# Patient Record
Sex: Male | Born: 1970 | Race: White | Hispanic: No | Marital: Single | Smoking: Former smoker
Health system: Southern US, Community
[De-identification: ages and names within clinical notes are randomized; demographics above are authoritative.]

## PROBLEM LIST (undated history)

## (undated) DIAGNOSIS — R918 Other nonspecific abnormal finding of lung field: Secondary | ICD-10-CM

## (undated) DIAGNOSIS — J45909 Unspecified asthma, uncomplicated: Secondary | ICD-10-CM

## (undated) DIAGNOSIS — D721 Eosinophilia, unspecified: Secondary | ICD-10-CM

## (undated) HISTORY — DX: Eosinophilia, unspecified: D72.10

## (undated) HISTORY — DX: Eosinophilia: D72.1

## (undated) HISTORY — DX: Unspecified asthma, uncomplicated: J45.909

## (undated) HISTORY — DX: Other nonspecific abnormal finding of lung field: R91.8

---

## 2017-06-16 DIAGNOSIS — J209 Acute bronchitis, unspecified: Secondary | ICD-10-CM | POA: Diagnosis not present

## 2017-09-23 DIAGNOSIS — L500 Allergic urticaria: Secondary | ICD-10-CM | POA: Diagnosis not present

## 2017-10-28 ENCOUNTER — Encounter (HOSPITAL_COMMUNITY): Payer: Self-pay | Admitting: Family Medicine

## 2017-10-28 ENCOUNTER — Ambulatory Visit (HOSPITAL_COMMUNITY)
Admission: EM | Admit: 2017-10-28 | Discharge: 2017-10-28 | Disposition: A | Discharge status: Home / Self Care | Payer: 59 | Attending: Family Medicine | Admitting: Family Medicine

## 2017-10-28 DIAGNOSIS — J209 Acute bronchitis, unspecified: Secondary | ICD-10-CM | POA: Diagnosis not present

## 2017-10-28 DIAGNOSIS — J4521 Mild intermittent asthma with (acute) exacerbation: Secondary | ICD-10-CM

## 2017-10-28 MED ORDER — ALBUTEROL SULFATE HFA 108 (90 BASE) MCG/ACT IN AERS: 2.0000 | INHALATION_SPRAY | RESPIRATORY_TRACT | 1 refills | Status: DC | PRN

## 2017-10-28 MED ORDER — IPRATROPIUM-ALBUTEROL 0.5-2.5 (3) MG/3ML IN SOLN
3.0000 mL | Freq: Once | RESPIRATORY_TRACT | Status: AC
  Administered 2017-10-28: 3 mL via RESPIRATORY_TRACT

## 2017-10-28 MED ORDER — IPRATROPIUM-ALBUTEROL 0.5-2.5 (3) MG/3ML IN SOLN
RESPIRATORY_TRACT | Status: AC
  Filled 2017-10-28: qty 3

## 2017-10-28 MED ORDER — PREDNISONE 20 MG PO TABS: ORAL_TABLET | ORAL | 0 refills | Status: DC

## 2017-10-28 NOTE — ED Triage Notes (Addendum)
Pt here for cough, congestion x 1 week. sts using OTC meds without relief. Hx of bronchitis. Reports hard to take a deep breath and a lot of drainage.

## 2017-10-28 NOTE — Discharge Instructions (Signed)
Return if symptoms do not improve over the next 24 hours.

## 2017-10-28 NOTE — ED Provider Notes (Signed)
[facility]   [id number] 10/28/17 Arrival Time: 1143   SUBJECTIVE:  Jose Ross is a 47 y.o. male who presents to the urgent care with complaint of cough, congestion x 1 week. sts using OTC meds without relief. Hx of bronchitis. Reports hard to take a deep breath and a lot of drainage.  He feels like he is breathing through a straw.  Has had this before years ago.  Ex-smoker.  No h/o asthma per se.   History reviewed. No pertinent past medical history. History reviewed. No pertinent family history. Social History   Socioeconomic History  . Marital status: Divorced    Spouse name: Not on file  . Number of children: Not on file  . Years of education: Not on file  . Highest education level: Not on file  Occupational History  . Not on file  Social Needs  . Financial resource strain: Not on file  . Food insecurity:    Worry: Not on file    Inability: Not on file  . Transportation needs:    Medical: Not on file    Non-medical: Not on file  Tobacco Use  . Smoking status: Never Smoker  . Smokeless tobacco: Never Used  Substance and Sexual Activity  . Alcohol use: Not on file  . Drug use: Not on file  . Sexual activity: Not on file  Lifestyle  . Physical activity:    Days per week: Not on file    Minutes per session: Not on file  . Stress: Not on file  Relationships  . Social connections:    Talks on phone: Not on file    Gets together: Not on file    Attends religious service: Not on file    Active member of club or organization: Not on file    Attends meetings of clubs or organizations: Not on file    Relationship status: Not on file  . Intimate partner violence:    Fear of current or ex partner: Not on file    Emotionally abused: Not on file    Physically abused: Not on file    Forced sexual activity: Not on file  Other Topics Concern  . Not on file  Social History Narrative  . Not on file   No outpatient medications have been marked as taking  for the 10/28/17 encounter [facility] Encounter).   No Known Allergies    ROS: As per HPI, remainder of ROS negative.   OBJECTIVE:   Vitals:   10/28/17 1217  BP: 136/88  Pulse: 78  Resp: (!) 22  Temp: 98.5 F (36.9 C)  SpO2: 96%     General appearance: alert; no distress Eyes: PERRL; EOMI; conjunctiva normal HENT: normocephalic; atraumatic;  nasal mucosa normal; oral mucosa normal Neck: supple; no thyromegaly Lungs: diffuse loud ronchi and wheezes on inspiration and expiration bilaterally Heart: regular rate and rhythm Back: no CVA tenderness Extremities: no cyanosis or edema; symmetrical with no gross deformities Skin: warm and dry Neurologic: normal gait; grossly normal Psychological: alert and cooperative; normal mood and affect      Labs:  No results found for this or any previous visit.  Labs Reviewed - No data to display  No results found.     ASSESSMENT & PLAN:  1. 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 ipratropium-albuterol (DUONEB) 0.5-2.5 (3) MG/3ML nebulizer solution 3 mL  . predniSONE (DELTASONE) 20 MG tablet  Sig: Two daily with food    Dispense:  10 tablet    Refill:  0  . albuterol (PROVENTIL HFA;VENTOLIN HFA) 108 (90 Base) MCG/ACT inhaler    Sig: Inhale 2 puffs into the lungs every 4 (four) hours as needed for wheezing or shortness of breath (cough, shortness of breath or wheezing.).    Dispense:  1 Inhaler    Refill:  1    Reviewed expectations re: course of current medical issues. Questions answered. Outlined signs and symptoms indicating need for more acute intervention. Patient verbalized understanding. After Visit Summary given.    Procedures:  Nebulizer treatment with partial improvement      Elvina SidleLauenstein, Traeton Bordas, MD 10/28/17 1302

## 2017-11-04 ENCOUNTER — Encounter (HOSPITAL_COMMUNITY): Payer: Self-pay | Admitting: Emergency Medicine

## 2017-11-04 ENCOUNTER — Ambulatory Visit (INDEPENDENT_AMBULATORY_CARE_PROVIDER_SITE_OTHER): Payer: 59

## 2017-11-04 ENCOUNTER — Other Ambulatory Visit: Payer: Self-pay

## 2017-11-04 ENCOUNTER — Ambulatory Visit (HOSPITAL_COMMUNITY)
Admission: EM | Admit: 2017-11-04 | Discharge: 2017-11-04 | Disposition: A | Discharge status: Home / Self Care | Payer: 59 | Attending: Family Medicine | Admitting: Family Medicine

## 2017-11-04 DIAGNOSIS — J209 Acute bronchitis, unspecified: Secondary | ICD-10-CM | POA: Diagnosis not present

## 2017-11-04 DIAGNOSIS — R0602 Shortness of breath: Secondary | ICD-10-CM | POA: Diagnosis not present

## 2017-11-04 DIAGNOSIS — R05 Cough: Secondary | ICD-10-CM | POA: Diagnosis not present

## 2017-11-04 MED ORDER — HYDROCOD POLST-CPM POLST ER 10-8 MG/5ML PO SUER: 5.0000 mL | Freq: Two times a day (BID) | ORAL | 0 refills | Status: AC | PRN

## 2017-11-04 MED ORDER — AZITHROMYCIN 250 MG PO TABS: 250.0000 mg | ORAL_TABLET | Freq: Every day | ORAL | 0 refills | Status: AC

## 2017-11-04 NOTE — ED Triage Notes (Signed)
C/o non-productive cough with chest congestion, "not better from last"

## 2017-11-04 NOTE — ED Provider Notes (Signed)
[facility]    CSN: [id number] Arrival date & time: 11/04/17  1002     History   Chief Complaint Chief Complaint  Patient presents with  . Cough    HPI Jose Ross is a 47 y.o. male.   With no medical history, presents today for coughing, congestion, running nose, difficult taking deep breath, and wheezing for 2 weeks.  Patient was seem a week ago for the same symptoms and was given prednisone and albuterol inhaler without relief. Patient denies any alleviating or aggravating factors. Denies history of asthma. Denies fever.      History reviewed. No pertinent past medical history.  There are no active problems to display for this patient.   History reviewed. No pertinent surgical history.     Home Medications    Prior to Admission medications   Medication Sig Start Date End Date Taking? Authorizing Provider  albuterol (PROVENTIL HFA;VENTOLIN HFA) 108 (90 Base) MCG/ACT inhaler Inhale 2 puffs into the lungs every 4 (four) hours as needed for wheezing or shortness of breath (cough, shortness of breath or wheezing.). 10/28/17   Elvina Sidle, MD  azithromycin (ZITHROMAX) 250 MG tablet Take 1 tablet (250 mg total) by mouth daily for 5 days. Take first 2 tablets together, then 1 every day until finished. 11/04/17 11/09/17  Lucia Estelle, NP  chlorpheniramine-HYDROcodone (TUSSIONEX PENNKINETIC ER) 10-8 MG/5ML SUER Take 5 mLs by mouth every 12 (twelve) hours as needed for up to 7 days for cough. 11/04/17 11/11/17  Lucia Estelle, NP  predniSONE (DELTASONE) 20 MG tablet Two daily with food 10/28/17   Elvina Sidle, MD    Family History No family history on file.  Social History Social History   Tobacco Use  . Smoking status: Never Smoker  . Smokeless tobacco: Never Used  Substance Use Topics  . Alcohol use: Not on file  . Drug use: Not on file     Allergies   Patient has no known allergies.   Review of Systems Review of Systems  Constitutional:  Positive for fatigue. Negative for chills and fever.  HENT: Positive for congestion and rhinorrhea. Negative for ear pain, sinus pressure, sinus pain, sneezing and sore throat.   Respiratory: Positive for cough and shortness of breath. Negative for wheezing.   Cardiovascular: Negative for chest pain and palpitations.  Gastrointestinal: Negative for abdominal pain, diarrhea and nausea.  Neurological: Negative for dizziness and headaches.     Physical Exam Triage Vital Signs ED Triage Vitals  Enc Vitals Group     BP 11/04/17 1025 127/87     Pulse Rate 11/04/17 1025 83     Resp --      Temp 11/04/17 1025 98 F (36.7 C)     Temp Source 11/04/17 1025 Oral     SpO2 11/04/17 1025 95 %     Weight --      Height --      Head Circumference --      Peak Flow --      Pain Score 11/04/17 1024 0     Pain Loc --      Pain Edu? --      Excl. in GC? --    No data found.  Updated Vital Signs BP 127/87 (BP Location: Right Arm)   Pulse 83   Temp 98 F (36.7 C) (Oral)   SpO2 95%   Physical Exam  Constitutional: He is oriented to person, place, and time. He appears well-developed and well-nourished.  HENT:  Head: Normocephalic and atraumatic.  Right Ear: External ear normal.  Left Ear: External ear normal.  Nose: Nose normal.  Mouth/Throat: Oropharynx is clear and moist. No oropharyngeal exudate.  TM pearly gray without erythema. No sinus tenderness on palpation.   Eyes: Pupils are equal, round, and reactive to light. Conjunctivae are normal.  Neck: Normal range of motion. Neck supple.  Cardiovascular: Normal rate, regular rhythm and normal heart sounds.  No murmur heard. Pulmonary/Chest: Effort normal. He has rales.  Abdominal: Soft. Bowel sounds are normal. There is no tenderness.  Neurological: He is alert and oriented to person, place, and time. Coordination normal.  Skin: Skin is warm and dry.  Nursing note and vitals reviewed.    UC Treatments / Results  Labs (all labs  ordered are listed, but only abnormal results are displayed) Labs Reviewed - No data to display  EKG None Radiology Dg Chest 2 View  Result Date: 11/04/2017 CLINICAL DATA:  Cough, chest congestion, and shortness of breath for the past 2 weeks. Former smoker. History of previous episodes of bronchitis. EXAM: CHEST - 2 VIEW COMPARISON:  None in PACs FINDINGS: The lungs are adequately inflated. The interstitial markings are coarse. The heart and pulmonary vascularity are normal. The mediastinum is normal in width. The trachea is midline. The bony thorax exhibits no acute abnormality. IMPRESSION: Mild interstitial prominence may reflect acute bronchitic change or smoking related changes. There is no alveolar pneumonia nor CHF. Electronically Signed   By: David  SwazilandJordan M.D.   On: 11/04/2017 11:05    Procedures Procedures (including critical care time)  Medications Ordered in UC Medications - No data to display   Initial Impression / Assessment and Plan / UC Course  I have reviewed the triage vital signs and the nursing notes.  Pertinent labs & imaging results that were available during my care of the patient were reviewed by me and considered in my medical decision making (see chart for details).  Final Clinical Impressions(s) / UC Diagnoses   Final diagnoses:  Acute bronchitis, unspecified organism   This x-ray shows acute bronchitic changes but no pneumonia. RX for tussionex given for cough. RX for Z-pak also given if cough continues to not improve. Advised patient to follow up with PCP for no improvement.   ED Discharge Orders        Ordered    chlorpheniramine-HYDROcodone ([facility]EX [facility]) 10-8 MG/5ML SUER  Every 12 hours PRN     11/04/17 1109    azithromycin (ZITHROMAX) 250 MG tablet  Daily     11/04/17 1109     Controlled Substance Prescriptions [place] Controlled Substance Registry consulted? Not Applicable   Lucia EstelleZheng, Lilou Kneip, NP 11/04/17 [phone]

## 2017-11-14 ENCOUNTER — Encounter (HOSPITAL_COMMUNITY): Payer: Self-pay | Admitting: Emergency Medicine

## 2017-11-14 ENCOUNTER — Ambulatory Visit (HOSPITAL_COMMUNITY)
Admission: EM | Admit: 2017-11-14 | Discharge: 2017-11-14 | Disposition: A | Discharge status: Home / Self Care | Payer: 59 | Attending: Family Medicine | Admitting: Family Medicine

## 2017-11-14 DIAGNOSIS — R062 Wheezing: Secondary | ICD-10-CM

## 2017-11-14 DIAGNOSIS — J988 Other specified respiratory disorders: Secondary | ICD-10-CM | POA: Diagnosis not present

## 2017-11-14 MED ORDER — PREDNISONE 20 MG PO TABS: ORAL_TABLET | ORAL | 0 refills | Status: DC

## 2017-11-14 MED ORDER — METHYLPREDNISOLONE SODIUM SUCC 125 MG IJ SOLR
INTRAMUSCULAR | Status: AC
  Filled 2017-11-14: qty 2

## 2017-11-14 MED ORDER — IPRATROPIUM-ALBUTEROL 0.5-2.5 (3) MG/3ML IN SOLN
RESPIRATORY_TRACT | Status: AC
  Filled 2017-11-14: qty 3

## 2017-11-14 MED ORDER — IPRATROPIUM-ALBUTEROL 0.5-2.5 (3) MG/3ML IN SOLN
3.0000 mL | Freq: Once | RESPIRATORY_TRACT | Status: AC
  Administered 2017-11-14: 3 mL via RESPIRATORY_TRACT

## 2017-11-14 MED ORDER — ALBUTEROL SULFATE HFA 108 (90 BASE) MCG/ACT IN AERS: 2.0000 | INHALATION_SPRAY | RESPIRATORY_TRACT | 1 refills | Status: DC | PRN

## 2017-11-14 MED ORDER — METHYLPREDNISOLONE SODIUM SUCC 125 MG IJ SOLR
125.0000 mg | Freq: Once | INTRAMUSCULAR | Status: AC
  Administered 2017-11-14: 125 mg via INTRAMUSCULAR

## 2017-11-14 MED ORDER — BENZONATATE 200 MG PO CAPS: 200.0000 mg | ORAL_CAPSULE | Freq: Two times a day (BID) | ORAL | 1 refills | Status: DC | PRN

## 2017-11-14 NOTE — Discharge Instructions (Signed)
Plenty of fluids. Start prednisone tomorrow.  Take 1 twice a day for 1 week, then 1 once a day for 1 week Use inhaler as needed Take Mucinex DM twice a day for cough Take Tessalon twice a day for cough until resolved Return as needed  Consider establishing with a PCP in [place].  I recommend Dr. Otilio Miu

## 2017-11-14 NOTE — ED Provider Notes (Signed)
[facility]    CSN: [id number] Arrival date & time: 11/14/17  4098     History   Chief Complaint Chief Complaint  Patient presents with  . Cough    HPI Jose Ross is a 47 y.o. male.   HPI  Urgent care follow up Still wheezing Has used almost all the albuterol inhaler No improvement - significant to date with prednisone for 5 d or the z pak Non smoker No underlying asthma Now with persistent SOB/wheeze/cough with no fever or chills or upper resp symptoms such as runny nose or congestion  He had a chest x-ray performed on 11/04/2017 and this was reviewed with him.  Some increased bronchial markings but no pneumonia.  History reviewed. No pertinent past medical history.  There are no active problems to display for this patient.   History reviewed. No pertinent surgical history.     Home Medications    Prior to Admission medications   Medication Sig Start Date End Date Taking? Authorizing Provider  albuterol (PROVENTIL HFA;VENTOLIN HFA) 108 (90 Base) MCG/ACT inhaler Inhale 2 puffs into the lungs every 4 (four) hours as needed for wheezing or shortness of breath (cough, shortness of breath or wheezing.). 11/14/17   Eustace Moore, MD  benzonatate (TESSALON) 200 MG capsule Take 1 capsule (200 mg total) by mouth 2 (two) times daily as needed for cough. 11/14/17   Eustace Moore, MD  predniSONE (DELTASONE) 20 MG tablet Two daily with food for 7 d then one a day for 7 d 11/14/17   Eustace Moore, MD    Family History No family history on file.  Social History Social History   Tobacco Use  . Smoking status: Never Smoker  . Smokeless tobacco: Never Used  Substance Use Topics  . Alcohol use: Not on file  . Drug use: Not on file     Allergies   Patient has no known allergies.   Review of Systems Review of Systems  Constitutional: Negative for chills and fever.  HENT: Negative for congestion, ear pain, postnasal drip and sore throat.     Eyes: Negative for pain, redness and visual disturbance.  Respiratory: Positive for cough, shortness of breath and wheezing.   Cardiovascular: Negative for chest pain and palpitations.  Gastrointestinal: Negative for abdominal pain and vomiting.  Genitourinary: Negative for dysuria and hematuria.  Musculoskeletal: Negative for arthralgias and back pain.  Skin: Negative for color change and rash.  Neurological: Negative for seizures and syncope.  All other systems reviewed and are negative.   Physical Exam Triage Vital Signs ED Triage Vitals  Enc Vitals Group     BP 11/14/17 1020 (!) 136/92     Pulse Rate 11/14/17 1020 72     Resp 11/14/17 1020 16     Temp 11/14/17 1020 97.6 F (36.4 C)     Temp Source 11/14/17 1020 Oral     SpO2 11/14/17 1020 100 %     Weight --      Height --      Head Circumference --      Peak Flow --      Pain Score 11/14/17 1114 0     Pain Loc --      Pain Edu? --      Excl. in GC? --    No data found.  Updated Vital Signs BP (!) 136/92 (BP Location: Right Arm)   Pulse 72   Temp 97.6 F (36.4 C) (Oral)   Resp  16   SpO2 100%       Physical Exam  Constitutional: He appears well-developed and well-nourished. He appears distressed.  Tachypnea, abbreviated sentences  HENT:  Head: Normocephalic and atraumatic.  Right Ear: External ear normal.  Left Ear: External ear normal.  Mouth/Throat: Oropharynx is clear and moist.  Eyes: Pupils are equal, round, and reactive to light. Conjunctivae are normal.  Neck: Neck supple.  Cardiovascular: Normal rate and regular rhythm.  No murmur heard. Pulmonary/Chest: Effort normal. No respiratory distress. He has wheezes.  Wheezes throughout both lung fields prevent adequate inspiration.  Her nebulized treatment was able to hear more lung sounds, but still had significant bilateral wheeze.  No rales or rhonchi noted.  Abdominal: Soft. There is no tenderness.  Musculoskeletal: He exhibits no edema.   Lymphadenopathy:    He has no cervical adenopathy.  Neurological: He is alert.  Skin: Skin is warm and dry.  Psychiatric: He has a normal mood and affect.  Nursing note and vitals reviewed.  Jittery after nebulized treatment  UC Treatments / Results   Procedures (including critical care time)  Medications Ordered in UC Medications  ipratropium-albuterol (DUONEB) 0.5-2.5 (3) MG/3ML nebulizer solution 3 mL (3 mLs Nebulization Given 11/14/17 1046)  methylPREDNISolone sodium succinate (SOLU-MEDROL) 125 mg/2 mL injection 125 mg (125 mg Intramuscular Given 11/14/17 1111)    Initial Impression / Assessment and Plan / UC Course  I have reviewed the triage vital signs and the nursing notes.  Pertinent labs & imaging results that were available during my care of the patient were reviewed by me and considered in my medical decision making (see chart for details).     Discussed with the patient that he had residual bronchial inflammation from his viral bronchitis.  Additional advice seems to be helpful.  This will be treated like an asthma attack with prednisone.  He will be given Tessalon for the coughing.  He will continue with albuterol.  He needs follow-up with the PCP.  He is given the name of the PCP taking new patients in [place].  Return here if worse at any time set up better Final Clinical Impressions(s) / UC Diagnoses   Final diagnoses:  Wheezing-associated respiratory infection     Discharge Instructions     Plenty of fluids. Start prednisone tomorrow.  Take 1 twice a day for 1 week, then 1 once a day for 1 week Use inhaler as needed Take Mucinex DM twice a day for cough Take Tessalon twice a day for cough until resolved Return as needed  Consider establishing with a PCP in [place].  I recommend Dr. Otilio Miu   ED Prescriptions    Medication Sig Dispense Auth. Provider   predniSONE (DELTASONE) 20 MG tablet Two daily with food for 7 d then one a day for 7 d 21 tablet Eustace Moore, MD   albuterol (PROVENTIL HFA;VENTOLIN HFA) 108 (90 Base) MCG/ACT inhaler Inhale 2 puffs into the lungs every 4 (four) hours as needed for wheezing or shortness of breath (cough, shortness of breath or wheezing.). 1 Inhaler Eustace Moore, MD   benzonatate (TESSALON) 200 MG capsule Take 1 capsule (200 mg total) by mouth 2 (two) times daily as needed for cough. 20 capsule Eustace Moore, MD     Controlled Substance Prescriptions [place] Controlled Substance Registry consulted? Not Applicable   Eustace Moore, MD 11/14/17 1244

## 2017-11-14 NOTE — ED Triage Notes (Signed)
Pt c/o cough, has been here twice for the same thing, pt states hes been getting worse.

## 2017-12-01 DIAGNOSIS — J329 Chronic sinusitis, unspecified: Secondary | ICD-10-CM | POA: Diagnosis not present

## 2017-12-02 ENCOUNTER — Encounter: Payer: Self-pay | Admitting: Emergency Medicine

## 2017-12-02 ENCOUNTER — Ambulatory Visit: Payer: 59 | Admitting: Emergency Medicine

## 2017-12-02 VITALS — BP 128/90 | HR 81 | Ht 70.0 in | Wt 231.8 lb

## 2017-12-02 DIAGNOSIS — R05 Cough: Secondary | ICD-10-CM | POA: Diagnosis not present

## 2017-12-02 DIAGNOSIS — R0602 Shortness of breath: Secondary | ICD-10-CM | POA: Diagnosis not present

## 2017-12-02 DIAGNOSIS — R0600 Dyspnea, unspecified: Secondary | ICD-10-CM | POA: Insufficient documentation

## 2017-12-02 DIAGNOSIS — R059 Cough, unspecified: Secondary | ICD-10-CM | POA: Insufficient documentation

## 2017-12-02 NOTE — Assessment & Plan Note (Signed)
Please continue your loratadine  daily Start fluticasone nasal spray, 2 sprays each nostril daily (not right before bedtime).  You may want to try doing nasal saline rinses once a day to clear the sinus mucous Please start omeprazole  daily until next visit.   You can complete the levaquin prescription.

## 2017-12-02 NOTE — Assessment & Plan Note (Signed)
Acute onset and also in the setting of cough, rhinitis.  I suspect that he has had upper airway irritation, multifactorial but allergies appear to be the driver here.  Unclear whether he may have true asthma. Will try to treat for the most obvious contributors, assess PFT.    We will perform pulmonary function testing at your next visit Please continue your loratadine  daily Start fluticasone nasal spray, 2 sprays each nostril daily (not right before bedtime).  You may want to try doing nasal saline rinses once a day to clear the sinus mucous Please start omeprazole  daily until next visit.   You can complete the levaquin prescription.  Follow with Dr Delton Coombes next available with full PFT.

## 2017-12-02 NOTE — Patient Instructions (Addendum)
We will perform pulmonary function testing at your next visit Please continue your loratadine  daily Start fluticasone nasal spray, 2 sprays each nostril daily (not right before bedtime).  You may want to try doing nasal saline rinses once a day to clear the sinus mucous Please start omeprazole  daily until next visit.   You can complete the levaquin prescription.  Follow with Dr Delton Coombes next available with full PFT.

## 2017-12-02 NOTE — Progress Notes (Signed)
Subjective:    Patient ID: Jose Ross, male    DOB: [birth year], 47 y.o.   MRN: [id number]  HPI 47 year old former smoker (15 pk-yrs) with little PMH beyond contact dermatitis, here today for evaluation of dyspnea.  Was well until early April, began to have cough, noise with breathing / wheeze, then some evolving dyspnea w exertion. Cough was initially productive, now not. Has continued to have dyspnea, cough even at night awakening. Now has a lot of clear nasal nasal drainage. He does have some GERD sx several times a week.  He has been seen at Urgent Care and treated with azithro, pred, then again on 2 occasions. Has been using albuterol frequently. Then was treated with levaquin yesterday at another UC in [place].    Review of Systems  Constitutional: Negative for fever and unexpected weight change.  HENT: Positive for congestion, nosebleeds, sinus pressure and sneezing. Negative for dental problem, ear pain, postnasal drip, rhinorrhea, sore throat and trouble swallowing.   Eyes: Negative for redness and itching.  Respiratory: Positive for cough, chest tightness, shortness of breath and wheezing.   Cardiovascular: Negative for palpitations and leg swelling.  Gastrointestinal: Negative for nausea and vomiting.  Genitourinary: Negative for dysuria.  Musculoskeletal: Negative for joint swelling.  Skin: Negative for rash.  Allergic/Immunologic: Positive for environmental allergies. Negative for food allergies and immunocompromised state.  Neurological: Positive for headaches.  Hematological: Does not bruise/bleed easily.  Psychiatric/Behavioral: Negative for dysphoric mood. The patient is not nervous/anxious.     No past medical history on file.   No family history on file.   Social History   Socioeconomic History  . Marital status: Divorced    Spouse name: Not on file  . Number of children: Not on file  . Years of education: Not on file  . Highest education level: Not on  file  Occupational History  . Not on file  Social Needs  . Financial resource strain: Not on file  . Food insecurity:    Worry: Not on file    Inability: Not on file  . Transportation needs:    Medical: Not on file    Non-medical: Not on file  Tobacco Use  . Smoking status: Never Smoker  . Smokeless tobacco: Never Used  Substance and Sexual Activity  . Alcohol use: Not on file  . Drug use: Not on file  . Sexual activity: Not on file  Lifestyle  . Physical activity:    Days per week: Not on file    Minutes per session: Not on file  . Stress: Not on file  Relationships  . Social connections:    Talks on phone: Not on file    Gets together: Not on file    Attends religious service: Not on file    Active member of club or organization: Not on file    Attends meetings of clubs or organizations: Not on file    Relationship status: Not on file  . Intimate partner violence:    Fear of current or ex partner: Not on file    Emotionally abused: Not on file    Physically abused: Not on file    Forced sexual activity: Not on file  Other Topics Concern  . Not on file  Social History Narrative  . 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   No  Known Allergies   Outpatient Medications Prior to Visit  Medication Sig Dispense Refill  . albuterol (PROVENTIL HFA;VENTOLIN HFA) 108 (90 Base) MCG/ACT inhaler Inhale 2 puffs into the lungs every 4 (four) hours as needed for wheezing or shortness of breath (cough, shortness of breath or wheezing.). 1 Inhaler 1  . benzonatate (TESSALON) 200 MG capsule Take 1 capsule (200 mg total) by mouth 2 (two) times daily as needed for cough. 20 capsule 1  . predniSONE (DELTASONE) 20 MG tablet Two daily with food for 7 d then one a day for 7 d (Patient not taking: Reported on 12/02/2017) 21 tablet 0   No facility-administered medications prior  to visit.         Objective:   Physical Exam Vitals:   12/02/17 1003  BP: 128/90  Pulse: 81  SpO2: 93%  Weight: 231 lb 12.8 oz (105.1 kg)  Height:  (1.778 m)   Gen: Pleasant, well-nourished, in no distress,  normal affect  ENT: No lesions,  mouth clear,  oropharynx clear but crowded, no postnasal drip  Neck: No JVD, insp and exp UA noise, stridor  Lungs: No use of accessory muscles, B rhonchi and referred UA noise  Cardiovascular: RRR, heart sounds normal, no murmur or gallops, no peripheral edema  Musculoskeletal: No deformities, no cyanosis or clubbing  Neuro: alert, non focal  Skin: Warm, no lesions or rash     Assessment & Plan:  Dyspnea Acute onset and also in the setting of cough, rhinitis.  I suspect that he has had upper airway irritation, multifactorial but allergies appear to be the driver here.  Unclear whether he may have true asthma. Will try to treat for the most obvious contributors, assess PFT.    We will perform pulmonary function testing at your next visit Please continue your loratadine  daily Start fluticasone nasal spray, 2 sprays each nostril daily (not right before bedtime).  You may want to try doing nasal saline rinses once a day to clear the sinus mucous Please start omeprazole  daily until next visit.   You can complete the levaquin prescription.  Follow with Dr Delton Coombes next available with full PFT.   Cough Please continue your loratadine  daily Start fluticasone nasal spray, 2 sprays each nostril daily (not right before bedtime).  You may want to try doing nasal saline rinses once a day to clear the sinus mucous Please start omeprazole  daily until next visit.   You can complete the levaquin prescription.   Levy Pupa, MD, PhD 12/02/2017, 10:42 AM [facility] [phone] or if no answer [phone]

## 2017-12-04 ENCOUNTER — Ambulatory Visit (INDEPENDENT_AMBULATORY_CARE_PROVIDER_SITE_OTHER): Payer: 59 | Admitting: Adult Health

## 2017-12-04 ENCOUNTER — Encounter: Payer: Self-pay | Admitting: Adult Health

## 2017-12-04 ENCOUNTER — Ambulatory Visit (INDEPENDENT_AMBULATORY_CARE_PROVIDER_SITE_OTHER)
Admission: RE | Admit: 2017-12-04 | Discharge: 2017-12-04 | Disposition: A | Discharge status: Home / Self Care | Payer: 59 | Source: Ambulatory Visit | Attending: Adult Health | Admitting: Adult Health

## 2017-12-04 ENCOUNTER — Other Ambulatory Visit (INDEPENDENT_AMBULATORY_CARE_PROVIDER_SITE_OTHER): Payer: 59

## 2017-12-04 VITALS — BP 142/84 | HR 109 | Ht 70.0 in | Wt 232.6 lb

## 2017-12-04 DIAGNOSIS — J4551 Severe persistent asthma with (acute) exacerbation: Secondary | ICD-10-CM | POA: Diagnosis not present

## 2017-12-04 DIAGNOSIS — J45909 Unspecified asthma, uncomplicated: Secondary | ICD-10-CM | POA: Insufficient documentation

## 2017-12-04 DIAGNOSIS — R0602 Shortness of breath: Secondary | ICD-10-CM

## 2017-12-04 DIAGNOSIS — R05 Cough: Secondary | ICD-10-CM | POA: Diagnosis not present

## 2017-12-04 LAB — CBC WITH DIFFERENTIAL/PLATELET
BASOS PCT: 1.4 % (ref 0.0–3.0)
Basophils Absolute: 0.2 10*3/uL — ABNORMAL HIGH (ref 0.0–0.1)
Eosinophils Absolute: 5.8 10*3/uL — ABNORMAL HIGH (ref 0.0–0.7)
Eosinophils Relative: 36.5 % — ABNORMAL HIGH (ref 0.0–5.0)
HEMATOCRIT: 51.6 % (ref 39.0–52.0)
Hemoglobin: 17.6 g/dL — ABNORMAL HIGH (ref 13.0–17.0)
LYMPHS ABS: 3.7 10*3/uL (ref 0.7–4.0)
Lymphocytes Relative: 23.5 % (ref 12.0–46.0)
MCHC: 34.2 g/dL (ref 30.0–36.0)
MCV: 93 fl (ref 78.0–100.0)
MONOS PCT: 5.8 % (ref 3.0–12.0)
Monocytes Absolute: 0.9 10*3/uL (ref 0.1–1.0)
NEUTROS ABS: 5.2 10*3/uL (ref 1.4–7.7)
NEUTROS PCT: 32.8 % — AB (ref 43.0–77.0)
PLATELETS: 195 10*3/uL (ref 150.0–400.0)
RBC: 5.55 Mil/uL (ref 4.22–5.81)
RDW: 13.5 % (ref 11.5–15.5)
WBC: 15.8 10*3/uL — ABNORMAL HIGH (ref 4.0–10.5)

## 2017-12-04 LAB — HEPATIC FUNCTION PANEL
ALK PHOS: 114 U/L (ref 39–117)
ALT: 14 U/L (ref 0–53)
AST: 13 U/L (ref 0–37)
Albumin: 4.3 g/dL (ref 3.5–5.2)
BILIRUBIN TOTAL: 0.4 mg/dL (ref 0.2–1.2)
Bilirubin, Direct: 0.1 mg/dL (ref 0.0–0.3)
Total Protein: 7.6 g/dL (ref 6.0–8.3)

## 2017-12-04 LAB — BASIC METABOLIC PANEL
BUN: 17 mg/dL (ref 6–23)
CALCIUM: 9.7 mg/dL (ref 8.4–10.5)
CO2: 28 mEq/L (ref 19–32)
CREATININE: 1.29 mg/dL (ref 0.40–1.50)
Chloride: 103 mEq/L (ref 96–112)
GFR: 63.48 mL/min (ref >60.00)
Glucose, Bld: 98 mg/dL (ref 70–99)
Potassium: 4.6 mEq/L (ref 3.5–5.1)
Sodium: 139 mEq/L (ref 135–145)

## 2017-12-04 LAB — BRAIN NATRIURETIC PEPTIDE: Pro B Natriuretic peptide (BNP): 14 pg/mL (ref 0.0–100.0)

## 2017-12-04 LAB — NITRIC OXIDE: Nitric Oxide: 152

## 2017-12-04 LAB — SEDIMENTATION RATE: Sed Rate: 44 mm/hr — ABNORMAL HIGH (ref 0–15)

## 2017-12-04 LAB — D-DIMER, QUANTITATIVE (NOT AT ARMC): D DIMER QUANT: 0.23 ug{FEU}/mL (ref <0.50)

## 2017-12-04 MED ORDER — METHYLPREDNISOLONE ACETATE 80 MG/ML IJ SUSP
120.0000 mg | Freq: Once | INTRAMUSCULAR | Status: AC
  Administered 2017-12-04: 120 mg via INTRAMUSCULAR

## 2017-12-04 MED ORDER — ALBUTEROL SULFATE (2.5 MG/3ML) 0.083% IN NEBU: 2.5000 mg | INHALATION_SOLUTION | RESPIRATORY_TRACT | 5 refills | Status: DC | PRN

## 2017-12-04 MED ORDER — LEVALBUTEROL HCL 0.63 MG/3ML IN NEBU
0.6300 mg | INHALATION_SOLUTION | Freq: Once | RESPIRATORY_TRACT | Status: AC
  Administered 2017-12-04: 0.63 mg via RESPIRATORY_TRACT

## 2017-12-04 MED ORDER — PREDNISONE 10 MG PO TABS: ORAL_TABLET | ORAL | 0 refills | Status: DC

## 2017-12-04 MED ORDER — MOMETASONE FURO-FORMOTEROL FUM 200-5 MCG/ACT IN AERO
2.0000 | INHALATION_SPRAY | Freq: Two times a day (BID) | RESPIRATORY_TRACT | 5 refills | Status: DC
Start: 2017-12-04 — End: 2018-03-05

## 2017-12-04 MED ORDER — MONTELUKAST SODIUM 10 MG PO TABS: 10.0000 mg | ORAL_TABLET | Freq: Every day | ORAL | 11 refills | Status: DC

## 2017-12-04 MED ORDER — MOMETASONE FURO-FORMOTEROL FUM 200-5 MCG/ACT IN AERO
2.0000 | INHALATION_SPRAY | Freq: Two times a day (BID) | RESPIRATORY_TRACT | Status: AC
  Administered 2017-12-04: 2 via RESPIRATORY_TRACT

## 2017-12-04 NOTE — Patient Instructions (Addendum)
Best option is to be admitted to hospital , if you change your mind call me back or go to ER . If you get worse will need to call 911 .  Finish Levaquin .  Prednisone taper over next week .  Begin Dulera 200 2 puffs Twice daily , rinse after use .  Mucinex DM Twice daily  As needed  Cough/congestion .  Claritin  daily  Prilosec  daily.  Begin Singulair   At bedtime  .  Avoid exposure to acrylic paints.  Albuterol inhlaer 2 puffs every 4hr as needed.  Set up Albuterol Neb -may use every 4hr as needed.  Follow up in 1 week Dr. Delton Coombes  Or Parrett NP and As needed   Please contact office for sooner follow up if symptoms do not improve or worsen or seek emergency care

## 2017-12-04 NOTE — Addendum Note (Signed)
Addended by: Boone Master E on: 12/04/2017 05:36 PM   Modules accepted: Orders

## 2017-12-04 NOTE — Assessment & Plan Note (Signed)
Acute asthma exacerbation slow to resolve.  Patient had multiple emergency room and urgent care visits has been treated with several courses of antibiotics and steroids with no significant clinical improvement.  On exam today patient has significant wheezing with accessory muscle use.  He was given a Xopenex nebulizer treatment with improved aeration but continued wheezing and accessory muscle use.  I recommended patient be admitted to the hospital.  Patient is very resistant to this and declines despite long discussion regarding potential complications of refractory asthma .  Chest xray today w/. No acute process. CBC w/ very high eosinophil levels . Singulair added.  IGE /RAST ordered.  Pt was given 2nd xopenex neb with improved aeration. He is speaking in full sentences . Went over action asthma plan .  Emphasized importance of seeking emergency care if not imrpoving or worsenigs  He was given a Depo-Medrol 120 mg IM injection in the office today.  Appeared to tolerate well.  He was given a sample of Dulera  rest of labs are pending Control for triggers including chronic rhinitis and GERD.  Finish antibiotics for possible underlying infectious source. Prednisone taper over the next 10 days.  He will return for close follow-up in 1 week sooner if needed  Plan  Patient Instructions  Best option is to be admitted to hospital , if you change your mind call me back or go to ER . If you get worse will need to call 911 .  Finish Levaquin .  Prednisone taper over next week .  Begin Dulera 200 2 puffs Twice daily , rinse after use .  Mucinex DM Twice daily  As needed  Cough/congestion .  Claritin  daily  Prilosec  daily.  Begin Singulair   At bedtime  .  Avoid exposure to acrylic paints.  Albuterol inhlaer 2 puffs every 4hr as needed.  Set up Albuterol Neb -may use every 4hr as needed.  Follow up in 1 week Dr. Delton Coombes  Or Starlina Lapre NP and As needed   Please contact office for sooner follow  up if symptoms do not improve or worsen or seek emergency care

## 2017-12-04 NOTE — Progress Notes (Signed)
  ID: Jose Ross, male    DOB: Nov 22, 1970, 47 y.o.   MRN: [id number]  Chief Complaint  Patient presents with  . Acute Visit    Asthma    Referring provider: Leslye Peer, MD  HPI: 47 year old male former smoker seen for pulmonary consult for shortness of breath and wheezing on Dec 02, 2017  12/04/2017 Acute OV : Cough and Dyspnea  Patient presents for an acute office visit.  Patient was seen on Dec 02, 2017 for a pulmonary consult for persistent shortness of breath cough and wheezing over the last 2 months.  He has been seen in the emergency room 3 times in an urgent care once.  He has been treated with azithromycin and prednisone on 2 separate occasions.  He was recently started on Levaquin and has a few days left of this for suspected sinusitis and bronchitis.  Patient says he has had only minimum improvement in symptoms .  He came to the office today for pulmonary function testing but was unable to do it due to severe shortness of breath cough and wheezing.  Patient says he has very sharp pleuritic pain along the left lateral ribs and back.  When he tries to take a deep breath or coughs.  He denies any hemoptysis, fever, orthopnea, edema. Prior to the last 2 months patient says he was healthy with no significant medical problems.  It was no no medications.  He is currently using albuterol few times a day Exhaled nitric oxide testing today was very high at 152 ppb. Denies birds/chickens at home . No unusual hobbies Girlfriend uses acrylic paints at home . No childhood asthma .  Mother has asthma .     No Known Allergies   There is no immunization history on file for this patient.  History reviewed. No pertinent past medical history.  Tobacco History: Social History   Tobacco Use  Smoking Status Never Smoker  Smokeless Tobacco Never Used   Counseling given: Not Answered   Outpatient Encounter Medications as of 12/04/2017  Medication Sig  . albuterol  (PROVENTIL HFA;VENTOLIN HFA) 108 (90 Base) MCG/ACT inhaler Inhale 2 puffs into the lungs every 4 (four) hours as needed for wheezing or shortness of breath (cough, shortness of breath or wheezing.).  Marland Kitchen benzonatate (TESSALON) 200 MG capsule Take 1 capsule (200 mg total) by mouth 2 (two) times daily as needed for cough.  . montelukast (SINGULAIR) 10 MG tablet Take 1 tablet (10 mg total) by mouth at bedtime.  . predniSONE (DELTASONE) 10 MG tablet 4 tabs for 3 days, then 3 tabs for 3 days, 2 tabs for 3 days, then 1 tab for 3 days, then stop  . [DISCONTINUED] predniSONE (DELTASONE) 20 MG tablet Two daily with food for 7 d then one a day for 7 d (Patient not taking: Reported on 12/02/2017)   Facility-Administered Encounter Medications as of 12/04/2017  Medication  . levalbuterol (XOPENEX) nebulizer solution 0.63 mg  . [COMPLETED] methylPREDNISolone acetate (DEPO-MEDROL) injection 120 mg     Review of Systems  Constitutional:   No  weight loss, night sweats,  Fevers, chills, fatigue, or  lassitude.  HEENT:   No headaches,  Difficulty swallowing,  Tooth/dental problems, or  Sore throat,                No sneezing, itching, ear ache, nasal congestion, post nasal drip,   CV:  No chest pain,  Orthopnea, PND, swelling in lower extremities, anasarca, dizziness, palpitations, syncope.  GI  No heartburn, indigestion, abdominal pain, nausea, vomiting, diarrhea, change in bowel habits, loss of appetite, bloody stools.   Resp:.  No chest wall deformity  Skin: no rash or lesions.  GU: no dysuria, change in color of urine, no urgency or frequency.  No flank pain, no hematuria   MS:  No joint pain or swelling.  No decreased range of motion.  No back pain.    Physical Exam  BP (!) 142/84 (BP Location: Left Arm, Cuff Size: Normal)   Pulse (!) 109   Ht  (1.778 m)   Wt 232 lb 9.6 oz (105.5 kg)   SpO2 94%   BMI 33.37 kg/m   GEN: A/Ox3; pleasant , NAD, obese   HEENT:  [place]/AT,  EACs-clear,  TMs-wnl, NOSE-clear, THROAT-clear, no lesions, no postnasal drip or exudate noted.   NECK:  Supple w/ fair ROM; no JVD; normal carotid impulses w/o bruits; no thyromegaly or nodules palpated; no lymphadenopathy.    RESP inspiratory and expiratory wheezing , positive accessory muscle use, no dullness to percussion, speaks in full sentences  CARD:  RRR, no m/r/g, no peripheral edema, pulses intact, no cyanosis or clubbing.  GI:   Soft & nt; nml bowel sounds; no organomegaly or masses detected.   Musco: Warm bil, no deformities or joint swelling noted.   Neuro: alert, no focal deficits noted.    Skin: Warm, no lesions or rashes    Lab Results:  CBC    Component Value Date/Time   WBC 15.8 (H) 12/04/2017 1548   RBC 5.55 12/04/2017 1548   HGB 17.6 (H) 12/04/2017 1548   HCT 51.6 12/04/2017 1548   PLT 195.0 12/04/2017 1548   MCV 93.0 12/04/2017 1548   MCHC 34.2 12/04/2017 1548   RDW 13.5 12/04/2017 1548   LYMPHSABS 3.7 12/04/2017 1548   MONOABS 0.9 12/04/2017 1548   EOSABS 5.8 (H) 12/04/2017 1548   BASOSABS 0.2 (H) 12/04/2017 1548    BMET    Component Value Date/Time   NA 139 12/04/2017 1548   K 4.6 12/04/2017 1548   CL 103 12/04/2017 1548   CO2 28 12/04/2017 1548   GLUCOSE 98 12/04/2017 1548   BUN 17 12/04/2017 1548   CREATININE 1.29 12/04/2017 1548   CALCIUM 9.7 12/04/2017 1548    BNP No results found for: BNP  ProBNP    Component Value Date/Time   PROBNP 14.0 12/04/2017 1548    Imaging: Dg Chest 2 View  Result Date: 12/04/2017 CLINICAL DATA:  Increased shortness of breath, chest tightness, wheezing, cough, congestion, and LEFT flank pain for 2 months, former smoker EXAM: CHEST - 2 VIEW COMPARISON:  11/04/2017 FINDINGS: Normal heart size, mediastinal contours, and pulmonary vascularity. Peribronchial thickening slightly increased since previous exam. No acute infiltrate, pleural effusion or pneumothorax. Bones unremarkable. IMPRESSION: Bronchitic changes  without infiltrate. Electronically Signed   By: Ulyses Southward M.D.   On: 12/04/2017 16:24     Assessment & Plan:   Acute asthma exacerbation Acute asthma exacerbation slow to resolve.  Patient had multiple emergency room and urgent care visits has been treated with several courses of antibiotics and steroids with no significant clinical improvement.  On exam today patient has significant wheezing with accessory muscle use.  He was given a Xopenex nebulizer treatment with improved aeration but continued wheezing and accessory muscle use.  I recommended patient be admitted to the hospital.  Patient is very resistant to this and declines despite long discussion regarding potential complications of refractory  asthma .  Chest xray today w/. No acute process. CBC w/ very high eosinophil levels . Singulair added.  IGE /RAST ordered.  Pt was given 2nd xopenex neb with improved aeration. He is speaking in full sentences . Went over action asthma plan .  Emphasized importance of seeking emergency care if not imrpoving or worsenigs  He was given a Depo-Medrol 120 mg IM injection in the office today.  Appeared to tolerate well.  He was given a sample of Dulera  rest of labs are pending Control for triggers including chronic rhinitis and GERD.  Finish antibiotics for possible underlying infectious source. Prednisone taper over the next 10 days.  He will return for close follow-up in 1 week sooner if needed  Plan  Patient Instructions  Best option is to be admitted to hospital , if you change your mind call me back or go to ER . If you get worse will need to call 911 .  Finish Levaquin .  Prednisone taper over next week .  Begin Dulera 200 2 puffs Twice daily , rinse after use .  Mucinex DM Twice daily  As needed  Cough/congestion .  Claritin  daily  Prilosec  daily.  Begin Singulair   At bedtime  .  Avoid exposure to acrylic paints.  Albuterol inhlaer 2 puffs every 4hr as needed.  Set up  Albuterol Neb -may use every 4hr as needed.  Follow up in 1 week Dr. Delton Coombes  Or Parrett NP and As needed   Please contact office for sooner follow up if symptoms do not improve or worsen or seek emergency care             Rubye Oaks, NP 12/04/2017

## 2017-12-05 LAB — RESPIRATORY ALLERGY PROFILE REGION II ~~LOC~~
Allergen, A. alternata, m6: 0.64 kU/L — ABNORMAL HIGH
Allergen, Cedar tree, t12: 0.1 kU/L — ABNORMAL HIGH
Allergen, Comm Silver Birch, t9: <0.1 kU/L
Allergen, Cottonwood, t14: <0.1 kU/L
Allergen, D pternoyssinus,d7: <0.1 kU/L
Allergen, Mouse Urine Protein, e78: <0.1 kU/L
Allergen, Mulberry, t76: <0.1 kU/L
Allergen, P. notatum, m1: <0.1 kU/L
Aspergillus fumigatus, m3: <0.1 kU/L
CLASS: 0
CLASS: 0
CLASS: 0
CLASS: 0
CLASS: 0
CLASS: 0
CLASS: 0
CLASS: 0
CLASS: 0
CLASS: 0
Cat Dander: <0.1 kU/L
Class: 0
Class: 0
Class: 0
Class: 0
Class: 0
Class: 0
Class: 0
Class: 0
Class: 0
Class: 0
Class: 0
Class: 0
Class: 0
Class: 1
D. farinae: <0.1 kU/L
IgE (Immunoglobulin E), Serum: 850 kU/L — ABNORMAL HIGH (ref <=114)
Johnson Grass: <0.1 kU/L
Pecan/Hickory Tree IgE: <0.1 kU/L
Rough Pigweed  IgE: <0.1 kU/L
Timothy Grass: <0.1 kU/L

## 2017-12-05 LAB — INTERPRETATION:

## 2017-12-10 ENCOUNTER — Telehealth: Payer: Self-pay | Admitting: Emergency Medicine

## 2017-12-10 NOTE — Telephone Encounter (Signed)
Notes recorded by Julio Sicks, NP on 12/09/2017 at 9:11 AM EDT Allergy profile /level is elevated - IgE is very high 850 .  Supports allergic asthma -  On return will check Aspergillus panel ab.  Please contact office for sooner follow up if symptoms do not improve or worsen or seek emergency care   Spoke with pt and notified of results per [facility]. Pt verbalized understanding and denied any questions.

## 2017-12-10 NOTE — Progress Notes (Signed)
Pt notified of results

## 2017-12-12 ENCOUNTER — Other Ambulatory Visit (INDEPENDENT_AMBULATORY_CARE_PROVIDER_SITE_OTHER): Payer: 59

## 2017-12-12 ENCOUNTER — Encounter: Payer: Self-pay | Admitting: Adult Health

## 2017-12-12 ENCOUNTER — Ambulatory Visit: Payer: 59 | Admitting: Adult Health

## 2017-12-12 VITALS — BP 148/88 | HR 92 | Ht 70.0 in | Wt 238.2 lb

## 2017-12-12 DIAGNOSIS — R059 Cough, unspecified: Secondary | ICD-10-CM

## 2017-12-12 DIAGNOSIS — J4551 Severe persistent asthma with (acute) exacerbation: Secondary | ICD-10-CM

## 2017-12-12 DIAGNOSIS — R05 Cough: Secondary | ICD-10-CM

## 2017-12-12 LAB — CBC WITH DIFFERENTIAL/PLATELET
BASOS ABS: 0 10*3/uL (ref 0.0–0.1)
Basophils Relative: 0.5 % (ref 0.0–3.0)
EOS ABS: 0.3 10*3/uL (ref 0.0–0.7)
Eosinophils Relative: 3.4 % (ref 0.0–5.0)
HCT: 48.5 % (ref 39.0–52.0)
Hemoglobin: 16.5 g/dL (ref 13.0–17.0)
Lymphocytes Relative: 21.9 % (ref 12.0–46.0)
Lymphs Abs: 2.2 10*3/uL (ref 0.7–4.0)
MCHC: 34 g/dL (ref 30.0–36.0)
MCV: 92.3 fl (ref 78.0–100.0)
MONO ABS: 0.8 10*3/uL (ref 0.1–1.0)
Monocytes Relative: 8.4 % (ref 3.0–12.0)
NEUTROS PCT: 65.8 % (ref 43.0–77.0)
Neutro Abs: 6.6 10*3/uL (ref 1.4–7.7)
Platelets: 235 10*3/uL (ref 150.0–400.0)
RBC: 5.25 Mil/uL (ref 4.22–5.81)
RDW: 14 % (ref 11.5–15.5)
WBC: 10 10*3/uL (ref 4.0–10.5)

## 2017-12-12 LAB — URINALYSIS, ROUTINE W REFLEX MICROSCOPIC
BILIRUBIN URINE: NEGATIVE
HGB URINE DIPSTICK: NEGATIVE
KETONES UR: NEGATIVE
Leukocytes, UA: NEGATIVE
NITRITE: NEGATIVE
Specific Gravity, Urine: 1.025 (ref 1.000–1.030)
Total Protein, Urine: NEGATIVE
URINE GLUCOSE: NEGATIVE
UROBILINOGEN UA: 0.2 (ref 0.0–1.0)
pH: 6 (ref 5.0–8.0)

## 2017-12-12 LAB — SEDIMENTATION RATE: SED RATE: 20 mm/h — AB (ref 0–15)

## 2017-12-12 LAB — NITRIC OXIDE: NITRIC OXIDE: 17

## 2017-12-12 MED ORDER — PREDNISONE 20 MG PO TABS: 20.0000 mg | ORAL_TABLET | Freq: Every day | ORAL | 0 refills | Status: DC

## 2017-12-12 MED ORDER — LEVALBUTEROL HCL 0.63 MG/3ML IN NEBU
0.6300 mg | INHALATION_SOLUTION | Freq: Once | RESPIRATORY_TRACT | Status: AC
  Administered 2017-12-12: 0.63 mg via RESPIRATORY_TRACT

## 2017-12-12 NOTE — Patient Instructions (Addendum)
Continue on Prednisone 20mg  daily .  Continue on Dulera 200 2 puffs Twice daily , rinse after use .  Mucinex DM Twice daily  As needed  Cough/congestion .  Change Claritin to Zyrtec 10mg  At bedtime  .  Prilosec 20mg  daily.  Begin Flonase 2 puffs daily .  Continue on Singulair At bedtime  .  Albuterol inhlaer 2 puffs every 4hr as needed.  Continue on Albuterol Neb -may use every 4hr as needed.  Labs today .  Sputum cx .  Set up HRCT Chest .  Follow up in 2 week Dr. Delton CoombesByrum  And As needed   Please contact office for sooner follow up if symptoms do not improve or worsen or seek emergency care

## 2017-12-12 NOTE — Progress Notes (Signed)
@Patient  ID: Jose Ross, male    DOB: 06/10/1971, 47 y.o.   MRN: [id number]  Chief Complaint  Patient presents with  . Follow-up    Asthma    Referring provider: No ref. provider found  HPI: 47 year old male former smoker seen for pulmonary consult for shortness of breath and wheezing on Dec 02, 2017 found to have significant allergic asthma  TEST  12/04/17 Exhaled nitric oxide testing today was very high at 152 ppb.  12/12/2017 Follow up : Asthma  Patient presents for a one-week follow-up.  Patient was seen last visit for an acute asthma exacerbation.  Patient has had difficulty over the last several months with recurrent asthma exacerbations requiring several courses of antibiotics and steroids with no significant clinical improvement.  Last visit patient was found to have an evaded exhaled nitric oxide test at 152.  Lab work showed very high eosinophil count.  With eosinophils at 5800.  IgE very high at 850. She was started on a prednisone taper.  And Dulera, Singulair Since last visit patient is feeling pt is feeling slightly better but still has significant cough and wheezing . Gets winded with minimal activity .  Exhaled nitric oxide testing is much improved down to 17 from previous 152 ppb .   No joint pain, rash or GI symptoms.       No Known Allergies   There is no immunization history on file for this patient.  History reviewed. No pertinent past medical history.  Tobacco History: Social History   Tobacco Use  Smoking Status Never Smoker  Smokeless Tobacco Never Used   Counseling given: Not Answered   Outpatient Encounter Medications as of 12/12/2017  Medication Sig  . albuterol (PROVENTIL HFA;VENTOLIN HFA) 108 (90 Base) MCG/ACT inhaler Inhale 2 puffs into the lungs every 4 (four) hours as needed for wheezing or shortness of breath (cough, shortness of breath or wheezing.).  Marland Kitchen albuterol (PROVENTIL) (2.5 MG/3ML) 0.083% nebulizer solution Take 3 mLs (2.5  mg total) by nebulization every 4 (four) hours as needed for wheezing or shortness of breath (dx: J45.51).  . benzonatate (TESSALON) 200 MG capsule Take 1 capsule (200 mg total) by mouth 2 (two) times daily as needed for cough.  . mometasone-formoterol (DULERA) 200-5 MCG/ACT AERO Inhale 2 puffs into the lungs 2 (two) times daily.  . montelukast (SINGULAIR) 10 MG tablet Take 1 tablet (10 mg total) by mouth at bedtime.  . predniSONE (DELTASONE) 10 MG tablet 4 tabs for 3 days, then 3 tabs for 3 days, 2 tabs for 3 days, then 1 tab for 3 days, then stop  . [EXPIRED] levalbuterol (XOPENEX) nebulizer solution 0.63 mg    No facility-administered encounter medications on file as of 12/12/2017.      Review of Systems  Constitutional:   No  weight loss, night sweats,  Fevers, chills, fatigue, or  lassitude.  HEENT:   No headaches,  Difficulty swallowing,  Tooth/dental problems, or  Sore throat,                No sneezing, itching, ear ache,  +nasal congestion, post nasal drip,   CV:  No chest pain,  Orthopnea, PND, swelling in lower extremities, anasarca, dizziness, palpitations, syncope.   GI  No heartburn, indigestion, abdominal pain, nausea, vomiting, diarrhea, change in bowel habits, loss of appetite, bloody stools.   Resp:    No chest wall deformity  Skin: no rash or lesions.  GU: no dysuria, change in color of urine, no  urgency or frequency.  No flank pain, no hematuria   MS:  No joint pain or swelling.  No decreased range of motion.  No back pain.    Physical Exam  There were no vitals taken for this visit.  GEN: A/Ox3; pleasant , NAD, well nourished    HEENT:  [place]/AT,  EACs-clear, TMs-wnl, NOSE-clear, THROAT-clear, no lesions, no postnasal drip or exudate noted.   NECK:  Supple w/ fair ROM; no JVD; normal carotid impulses w/o bruits; no thyromegaly or nodules palpated; no lymphadenopathy.   No stridor RESP bilateral expiratory wheezes, speaks in full sentences clear . no  accessory muscle use, no dullness to percussion  CARD:  RRR, no m/r/g, no peripheral edema, pulses intact, no cyanosis or clubbing.  GI:   Soft & nt; nml bowel sounds; no organomegaly or masses detected.   Musco: Warm bil, no deformities or joint swelling noted.   Neuro: alert, no focal deficits noted.    Skin: Warm, no lesions or rashes    Lab Results:  CBC    Component Value Date/Time   WBC 15.8 (H) 12/04/2017 1548   RBC 5.55 12/04/2017 1548   HGB 17.6 (H) 12/04/2017 1548   HCT 51.6 12/04/2017 1548   PLT 195.0 12/04/2017 1548   MCV 93.0 12/04/2017 1548   MCHC 34.2 12/04/2017 1548   RDW 13.5 12/04/2017 1548   LYMPHSABS 3.7 12/04/2017 1548   MONOABS 0.9 12/04/2017 1548   EOSABS 5.8 (H) 12/04/2017 1548   BASOSABS 0.2 (H) 12/04/2017 1548    BMET    Component Value Date/Time   NA 139 12/04/2017 1548   K 4.6 12/04/2017 1548   CL 103 12/04/2017 1548   CO2 28 12/04/2017 1548   GLUCOSE 98 12/04/2017 1548   BUN 17 12/04/2017 1548   CREATININE 1.29 12/04/2017 1548   CALCIUM 9.7 12/04/2017 1548    BNP No results found for: BNP  ProBNP    Component Value Date/Time   PROBNP 14.0 12/04/2017 1548    Imaging: Dg Chest 2 View  Result Date: 12/04/2017 CLINICAL DATA:  Increased shortness of breath, chest tightness, wheezing, cough, congestion, and LEFT flank pain for 2 months, former smoker EXAM: CHEST - 2 VIEW COMPARISON:  11/04/2017 FINDINGS: Normal heart size, mediastinal contours, and pulmonary vascularity. Peribronchial thickening slightly increased since previous exam. No acute infiltrate, pleural effusion or pneumothorax. Bones unremarkable. IMPRESSION: Bronchitic changes without infiltrate. Electronically Signed   By: Ulyses Southward M.D.   On: 12/04/2017 16:24     Assessment & Plan:   Acute asthma exacerbation Newly diagnosed severe allergic asthma with very high eosinophil and IgE level. Despite aggressive therapy with ICS/laba, Singulair, prednisone, Flonase and  antihistamine patient continues to have active symptoms.  He does have slight clinical improvement.  Will continue on prednisone at 20 mg. Look for underlying etiology for elevated eosinophil and IgE levels. High-res CT chest, ANCA panel sed rate are pending Close follow up ,  May need FOB with BAL to look at eosinophils .  Check sputum culture, Aspergillus panel.  Plan  Patient Instructions  Continue on Prednisone 20mg  daily .  Continue on Dulera 200 2 puffs Twice daily , rinse after use .  Mucinex DM Twice daily  As needed  Cough/congestion .  Change Claritin to Zyrtec 10mg  At bedtime  .  Prilosec 20mg  daily.  Begin Flonase 2 puffs daily .  Continue on Singulair At bedtime  .  Albuterol inhlaer 2 puffs every 4hr as needed.  Continue  on Albuterol Neb -may use every 4hr as needed.  Labs today .  Sputum cx .  Set up HRCT Chest .  Follow up in 2 week Dr. Delton CoombesByrum  And As needed   Please contact office for sooner follow up if symptoms do not improve or worsen or seek emergency care           Rubye Oaksammy Avyn Aden, NP 12/12/2017

## 2017-12-12 NOTE — Assessment & Plan Note (Signed)
Newly diagnosed severe allergic asthma with very high eosinophil and IgE level. Despite aggressive therapy with ICS/laba, Singulair, prednisone, Flonase and antihistamine patient continues to have active symptoms.  He does have slight clinical improvement.  Will continue on prednisone at 20 mg. Look for underlying etiology for elevated eosinophil and IgE levels. High-res CT chest, ANCA panel sed rate are pending Close follow up ,  May need FOB with BAL to look at eosinophils .  Check sputum culture, Aspergillus panel.  Plan  Patient Instructions  Continue on Prednisone  daily .  Continue on Dulera 200 2 puffs Twice daily , rinse after use .  Mucinex DM Twice daily  As needed  Cough/congestion .  Change Claritin to Zyrtec  At bedtime  .  Prilosec  daily.  Begin Flonase 2 puffs daily .  Continue on Singulair At bedtime  .  Albuterol inhlaer 2 puffs every 4hr as needed.  Continue on Albuterol Neb -may use every 4hr as needed.  Labs today .  Sputum cx .  Set up HRCT Chest .  Follow up in 2 week Dr. Delton Coombes  And As needed   Please contact office for sooner follow up if symptoms do not improve or worsen or seek emergency care

## 2017-12-15 LAB — ANCA SCREEN W REFLEX TITER: ANCA SCREEN: NEGATIVE

## 2017-12-23 ENCOUNTER — Encounter: Payer: Self-pay | Admitting: Emergency Medicine

## 2017-12-23 ENCOUNTER — Ambulatory Visit: Payer: 59 | Admitting: Emergency Medicine

## 2017-12-23 VITALS — BP 112/70 | HR 86 | Ht 70.0 in | Wt 233.0 lb

## 2017-12-23 DIAGNOSIS — R059 Cough, unspecified: Secondary | ICD-10-CM

## 2017-12-23 DIAGNOSIS — J4550 Severe persistent asthma, uncomplicated: Secondary | ICD-10-CM

## 2017-12-23 DIAGNOSIS — D721 Eosinophilia, unspecified: Secondary | ICD-10-CM

## 2017-12-23 DIAGNOSIS — J309 Allergic rhinitis, unspecified: Secondary | ICD-10-CM | POA: Diagnosis not present

## 2017-12-23 DIAGNOSIS — R05 Cough: Secondary | ICD-10-CM

## 2017-12-23 NOTE — Progress Notes (Signed)
Subjective:    Patient ID: Jose Ross, male    DOB: 18-Jan-1971, 47 y.o.   MRN: [id number]  HPI 47 year old former smoker (15 pk-yrs) with little PMH beyond contact dermatitis, here today for evaluation of dyspnea.  Was well until early April, began to have cough, noise with breathing / wheeze, then some evolving dyspnea w exertion. Cough was initially productive, now not. Has continued to have dyspnea, cough even at night awakening. Now has a lot of clear nasal nasal drainage. He does have some GERD sx several times a week.  He has been seen at Urgent Care and treated with azithro, pred, then again on 2 occasions. Has been using albuterol frequently. Then was treated with levaquin yesterday at another UC in [place].   ROV 12/23/17 --this follow-up visit for patient with a minimal tobacco history with dyspnea and clinical history consistent with asthma.  He has been treated for what sounds like recurrent exacerbations and has been found to have extremely high exhaled nitric oxide level, eosinophil count, IgE.  He was last seen in our office 5/31 by T Parrett. He is now on Dulera and Singulair, completed a pred taper 2 days ago. He is using albuterol about 2x a day. RAST testing > pending. His congestion and cough are better. He still has some dyspnea, chest tightness. CXR 5/23 > bronchitis changes. A CT chest is pending.   His FENO today is 74   ANCA 5/31 >> negative  Review of Systems  Constitutional: Negative for fever and unexpected weight change.  HENT: Positive for congestion, nosebleeds, sinus pressure and sneezing. Negative for dental problem, ear pain, postnasal drip, rhinorrhea, sore throat and trouble swallowing.   Eyes: Negative for redness and itching.  Respiratory: Positive for cough, chest tightness, shortness of breath and wheezing.   Cardiovascular: Negative for palpitations and leg swelling.  Gastrointestinal: Negative for nausea and vomiting.  Genitourinary: Negative  for dysuria.  Musculoskeletal: Negative for joint swelling.  Skin: Negative for rash.  Allergic/Immunologic: Positive for environmental allergies. Negative for food allergies and immunocompromised state.  Neurological: Positive for headaches.  Hematological: Does not bruise/bleed easily.  Psychiatric/Behavioral: Negative for dysphoric mood. The patient is not nervous/anxious.     No past medical history on file.   No family history on file.   Social History   Socioeconomic History  . Marital status: Divorced    Spouse name: Not on file  . Number of children: Not on file  . Years of education: Not on file  . Highest education level: Not on file  Occupational History  . Not on file  Social Needs  . Financial resource strain: Not on file  . Food insecurity:    Worry: Not on file    Inability: Not on file  . Transportation needs:    Medical: Not on file    Non-medical: Not on file  Tobacco Use  . Smoking status: Never Smoker  . Smokeless tobacco: Never Used  Substance and Sexual Activity  . Alcohol use: Not on file  . Drug use: Not on file  . Sexual activity: Not on file  Lifestyle  . Physical activity:    Days per week: Not on file    Minutes per session: Not on file  . Stress: Not on file  Relationships  . Social connections:    Talks on phone: Not on file    Gets together: Not on file    Attends religious service: Not on file  Active member of club or organization: Not on file    Attends meetings of clubs or organizations: Not on file    Relationship status: Not on file  . Intimate partner violence:    Fear of current or ex partner: Not on file    Emotionally abused: Not on file    Physically abused: Not on file    Forced sexual activity: Not on file  Other Topics Concern  . Not on file  Social History Narrative  . 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  No military Has lived throughout the [place]  Lives in a townhouse, no known  mold exposure.   No Known Allergies   Outpatient Medications Prior to Visit  Medication Sig Dispense Refill  . albuterol (PROVENTIL HFA;VENTOLIN HFA) 108 (90 Base) MCG/ACT inhaler Inhale 2 puffs into the lungs every 4 (four) hours as needed for wheezing or shortness of breath (cough, shortness of breath or wheezing.). 1 Inhaler 1  . albuterol (PROVENTIL) (2.5 MG/3ML) 0.083% nebulizer solution Take 3 mLs (2.5 mg total) by nebulization every 4 (four) hours as needed for wheezing or shortness of breath (dx: J45.51). 75 mL 5  . benzonatate (TESSALON) 200 MG capsule Take 1 capsule (200 mg total) by mouth 2 (two) times daily as needed for cough. 20 capsule 1  . mometasone-formoterol (DULERA) 200-5 MCG/ACT AERO Inhale 2 puffs into the lungs 2 (two) times daily. 1 Inhaler 5  . montelukast (SINGULAIR) 10 MG tablet Take 1 tablet (10 mg total) by mouth at bedtime. 30 tablet 11  . predniSONE (DELTASONE) 10 MG tablet 4 tabs for 3 days, then 3 tabs for 3 days, 2 tabs for 3 days, then 1 tab for 3 days, then stop 30 tablet 0  . predniSONE (DELTASONE) 20 MG tablet Take 1 tablet (20 mg total) by mouth daily with breakfast. 30 tablet 0   No facility-administered medications prior to visit.         Objective:   Physical Exam Vitals:   12/23/17 0940 12/23/17 0941  BP:  112/70  Pulse:  86  SpO2:  98%  Weight: 233 lb (105.7 kg)   Height: 5\' 10"  (1.778 m)    Gen: Pleasant, well-nourished, in no distress,  normal affect  ENT: No lesions,  mouth clear,  oropharynx clear but crowded, no postnasal drip  Neck: No JVD, no stridor   Lungs: No use of accessory muscles, clear bilaterally  Cardiovascular: RRR, heart sounds normal, no murmur or gallops, no peripheral edema  Musculoskeletal: No deformities, no cyanosis or clubbing  Neuro: alert, non focal  Skin: Warm, no lesions or rash     Assessment & Plan:  Asthma With all the clinical manifestations although pulmonary function testing has not yet  been done.  He has a profound eosinophilia, elevated IgE.  He responded to the addition of Dulera, Singulair.  He also benefited from a prednisone taper which he just completed.  CT scan of his chest has been ordered given his bronchitic changes and the profound eosinophilia.  I do not suspect an eosinophilic pneumonia based on the chest x-ray,   But it needs to be evaluated.  We will reschedule his PFT.  He needs an allergy evaluation and skin testing I will refer him for this as well.  We will refer you to Allergy for official skin testing and possible allergy shots.  Continue zyrtec 10mg  daily Please continue Dulera 2 puffs twice a day.  Remember to rinse and gargle after using. Please continue Singulair 10 mg each evening. Keep your albuterol available to use if needed for shortness of breath, wheezing, chest tightness. Get your CT scan of the chest as planned We will need to reschedule your PFT Follow with Dr Delton CoombesByrum after the CT scan of your chest to review the results and plan our next steps.  Levy Pupaobert Chayden Garrelts, MD, PhD 12/23/2017, 10:25 AM [facility] [phone] or if no answer [phone]

## 2017-12-23 NOTE — Progress Notes (Signed)
Called spoke with patient, advised of lab results / recs as stated by TP.  Pt verbalized understanding and denied any questions. 

## 2017-12-23 NOTE — Assessment & Plan Note (Signed)
With all the clinical manifestations although pulmonary function testing has not yet been done.  He has a profound eosinophilia, elevated IgE.  He responded to the addition of Dulera, Singulair.  He also benefited from a prednisone taper which he just completed.  CT scan of his chest has been ordered given his bronchitic changes and the profound eosinophilia.  I do not suspect an eosinophilic pneumonia based on the chest x-ray,   But it needs to be evaluated.  We will reschedule his PFT.  He needs an allergy evaluation and skin testing I will refer him for this as well.  We will refer you to Allergy for official skin testing and possible allergy shots.  Continue zyrtec 10mg  daily Please continue Dulera 2 puffs twice a day.  Remember to rinse and gargle after using. Please continue Singulair 10 mg each evening. Keep your albuterol available to use if needed for shortness of breath, wheezing, chest tightness. Get your CT scan of the chest as planned We will need to reschedule your PFT Follow with Dr Delton CoombesByrum after the CT scan of your chest to review the results and plan our next steps.

## 2017-12-23 NOTE — Patient Instructions (Addendum)
We will refer you to Allergy for official skin testing and possible allergy shots.  Continue zyrtec 10mg  daily Please continue Dulera 2 puffs twice a day.  Remember to rinse and gargle after using. Please continue Singulair 10 mg each evening. Keep your albuterol available to use if needed for shortness of breath, wheezing, chest tightness. Get your CT scan of the chest as planned We will need to reschedule your PFT Follow with Dr Delton CoombesByrum after the CT scan of your chest to review the results and plan our next steps.

## 2017-12-26 ENCOUNTER — Other Ambulatory Visit: Payer: Self-pay | Admitting: Emergency Medicine

## 2017-12-26 DIAGNOSIS — J4550 Severe persistent asthma, uncomplicated: Secondary | ICD-10-CM

## 2017-12-29 ENCOUNTER — Ambulatory Visit (INDEPENDENT_AMBULATORY_CARE_PROVIDER_SITE_OTHER): Payer: 59 | Admitting: Emergency Medicine

## 2017-12-29 DIAGNOSIS — J4550 Severe persistent asthma, uncomplicated: Secondary | ICD-10-CM | POA: Diagnosis not present

## 2017-12-29 LAB — PULMONARY FUNCTION TEST
DL/VA % PRED: 106 %
DL/VA: 4.99 ml/min/mmHg/L
DLCO COR: 29.91 ml/min/mmHg
DLCO UNC % PRED: 95 %
DLCO cor % pred: 90 %
DLCO unc: 31.4 ml/min/mmHg
FEF 25-75 Post: 3.86 L/sec
FEF 25-75 Pre: 2.62 L/sec
FEF2575-%CHANGE-POST: 47 %
FEF2575-%PRED-PRE: 71 %
FEF2575-%Pred-Post: 105 %
FEV1-%Change-Post: 13 %
FEV1-%PRED-PRE: 81 %
FEV1-%Pred-Post: 92 %
FEV1-Post: 3.78 L
FEV1-Pre: 3.34 L
FEV1FVC-%CHANGE-POST: 3 %
FEV1FVC-%Pred-Pre: 94 %
FEV6-%Change-Post: 9 %
FEV6-%PRED-PRE: 89 %
FEV6-%Pred-Post: 97 %
FEV6-Post: 4.93 L
FEV6-Pre: 4.52 L
FEV6FVC-%Change-Post: 0 %
FEV6FVC-%PRED-PRE: 103 %
FEV6FVC-%Pred-Post: 103 %
FVC-%Change-Post: 9 %
FVC-%PRED-PRE: 86 %
FVC-%Pred-Post: 94 %
FVC-POST: 4.93 L
FVC-PRE: 4.52 L
POST FEV1/FVC RATIO: 77 %
POST FEV6/FVC RATIO: 100 %
Pre FEV1/FVC ratio: 74 %
Pre FEV6/FVC Ratio: 100 %
RV % PRED: 92 %
RV: 1.85 L
TLC % pred: 90 %
TLC: 6.39 L

## 2017-12-29 NOTE — Progress Notes (Signed)
PFT completed today. 12/29/17  

## 2017-12-30 LAB — ASPERGILLUS IGE PANEL
A. Amstel/Glaucu Class Interp: 0
A. Flavus Class Interp: 0
A. Fumigatus Class Interp: 0
A. NIGER CLASS INTERP: 0
A. Nidulans Class Interp: 0
A. Terreus Class Interp: 0
A. VERSICOLOR CLASS INTERP: 0
Aspergillus flavus IgE: <0.35 kU/L (ref <0.35)
Aspergillus fumigatus IgE: <0.1 kU/L (ref <0.35)
Aspergillus nidulans IgE: <0.35 kU/L (ref <0.35)
Aspergillus terreus IgE: <0.35 kU/L (ref <0.35)

## 2017-12-30 NOTE — Progress Notes (Signed)
LMOMTCB x 1 

## 2017-12-31 ENCOUNTER — Telehealth: Payer: Self-pay | Admitting: Emergency Medicine

## 2017-12-31 NOTE — Telephone Encounter (Signed)
Advised pt of results. Pt understood and nothing further is needed.     Notes recorded by Melvenia Needles, NP on 12/23/2017 at 9:30 AM EDT Aspergillus panel pending  Rest of labs are improved ESR decreased, eosinophils Dropped dramatically that is great  ANCA screen is neg.  Discuss in detail with Dr. Lamonte Sakai Ov  Please contact office for sooner follow up if symptoms do not improve or worsen or seek emergency care   ANCA screen with reflex titer  Order: [id number]  Status:  Final result Visible to patient:  No (Not Released) Next appt:  01/01/2018 at 04:30 PM in Radiology (LBCT-CT 1) Dx:  Cough   Ref Range & Units 2wk ago  ANCA Screen NEGATIVE NEGATIVE   Comment: ANCA Screen includes evaluation for p-ANCA, c-ANCA and  atypical p-ANCA. A positive ANCA screen reflexes to  titer and pattern(s), e.g., cytoplasmic pattern  (c-ANCA), perinuclear pattern (p-ANCA), or atypical  p-ANCA pattern. c-ANCA and p-ANCA are observed in  vasculitis, whereas atypical p-ANCA is observed in  IBD (Inflammatory Bowel Disease). Atypical p-ANCA is  detected in about 55% to 80% of patients with  ulcerative colitis but only 5% to 25% of patients with  Crohn's disease.  Marland Kitchen   Resulting Agency  [facility]      Specimen Collected: 12/12/17 17:25 Last Resulted: 12/15/17 14:27     Lab Flowsheet    Order Details    View Encounter    Lab and Collection Details    Routing    Result History          Other Results from 12/12/2017   Result Notes for Urinalysis, Routine w reflex microscopic   Notes recorded by Rinaldo Ratel, CMA on 12/30/2017 at 3:23 PM EDT LMOM TCB x1. ------  Notes recorded by Melvenia Needles, NP on 12/30/2017 at 10:24 AM EDT Allergy labs did not show aspergillus .  Cont w. Ov recs and follow up with Allergist.  Please contact office for sooner follow up if symptoms do not improve or worsen or seek emergency care    ------  Notes recorded by Rinaldo Ratel, CMA on 12/23/2017  at 5:08 PM EDT Called spoke with patient, advised of lab results / recs as stated by TP. Pt verbalized understanding and denied any questions.  ------  Notes recorded by Melvenia Needles, NP on 12/23/2017 at 9:30 AM EDT Aspergillus panel pending  Rest of labs are improved ESR decreased, eosinophils Dropped dramatically that is great  ANCA screen is neg.  Discuss in detail with Dr. Lamonte Sakai Ov  Please contact office for sooner follow up if symptoms do not improve or worsen or seek emergency care   Urinalysis, Routine w reflex microscopic  Order: [id number]   Status:  Final result Visible to patient:  No (Not Released) Next appt:  01/01/2018 at 04:30 PM in Radiology (LBCT-CT 1) Dx:  Cough   Ref Range & Units 2wk ago  Color, Urine Yellow;Lt. Yellow YELLOW   APPearance Clear CLEAR   Specific Gravity, Urine 1.000 - 1.030 1.025   pH 5.0 - 8.0 6.0   Total Protein, Urine Negative NEGATIVE   Urine Glucose Negative NEGATIVE   Ketones, ur Negative NEGATIVE   Bilirubin Urine Negative NEGATIVE   Hgb urine dipstick Negative NEGATIVE   Urobilinogen, UA 0.0 - 1.0 0.2   Leukocytes, UA Negative NEGATIVE   Nitrite Negative NEGATIVE   WBC, UA 0-2/hpf 0-2/hpf   RBC / HPF 0-2/hpf 0-2/hpf   Squamous Epithelial /  LPF Rare(0-4/hpf) Rare(0-4/hpf)   Resulting Agency  [facility]      Specimen Collected: 12/12/17 17:25 Last Resulted: 12/12/17 17:42     Lab Flowsheet    Order Details    View Encounter    Lab and Collection Details    Routing    Result History            Result Notes for Sedimentation rate   Notes recorded by Rinaldo Ratel, CMA on 12/30/2017 at 3:23 PM EDT LMOM TCB x1. ------  Notes recorded by Melvenia Needles, NP on 12/30/2017 at 10:24 AM EDT Allergy labs did not show aspergillus .  Cont w. Ov recs and follow up with Allergist.  Please contact office for sooner follow up if symptoms do not improve or worsen or seek emergency care    ------  Notes recorded  by Rinaldo Ratel, CMA on 12/23/2017 at 5:08 PM EDT Called spoke with patient, advised of lab results / recs as stated by TP. Pt verbalized understanding and denied any questions.  ------  Notes recorded by Melvenia Needles, NP on 12/23/2017 at 9:30 AM EDT Aspergillus panel pending  Rest of labs are improved ESR decreased, eosinophils Dropped dramatically that is great  ANCA screen is neg.  Discuss in detail with Dr. Lamonte Sakai Ov  Please contact office for sooner follow up if symptoms do not improve or worsen or seek emergency care   Contains abnormal data Sedimentation rate  Order: [id number]   Status:  Final result Visible to patient:  No (Not Released) Next appt:  01/01/2018 at 04:30 PM in Radiology (LBCT-CT 1) Dx:  Cough   Ref Range & Units 2wk ago 3wk ago  Sed Rate 0 - 15 mm/hr 20High   44High    Resulting Agency  [facility]      Specimen Collected: 12/12/17 17:25 Last Resulted: 12/12/17 17:37     Lab Flowsheet    Order Details    View Encounter    Lab and Collection Details    Routing    Result History            Result Notes for CBC w/Diff   Notes recorded by Rinaldo Ratel, CMA on 12/30/2017 at 3:23 PM EDT LMOM TCB x1. ------  Notes recorded by Melvenia Needles, NP on 12/30/2017 at 10:24 AM EDT Allergy labs did not show aspergillus .  Cont w. Ov recs and follow up with Allergist.  Please contact office for sooner follow up if symptoms do not improve or worsen or seek emergency care    ------  Notes recorded by Rinaldo Ratel, CMA on 12/23/2017 at 5:08 PM EDT Called spoke with patient, advised of lab results / recs as stated by TP. Pt verbalized understanding and denied any questions.  ------  Notes recorded by Melvenia Needles, NP on 12/23/2017 at 9:30 AM EDT Aspergillus panel pending  Rest of labs are improved ESR decreased, eosinophils Dropped dramatically that is great  ANCA screen is neg.  Discuss in detail with Dr.  Lamonte Sakai Ov  Please contact office for sooner follow up if symptoms do not improve or worsen or seek emergency care   CBC w/Diff  Order: [id number]   Status:  Final result Visible to patient:  No (Not Released) Next appt:  01/01/2018 at 04:30 PM in Radiology (LBCT-CT 1) Dx:  Cough   Ref Range & Units 2wk ago 3wk ago  WBC 4.0 - 10.5 K/uL 10.0  15.8High    RBC 4.22 - 5.81 Mil/uL 5.25  5.55   Hemoglobin 13.0 - 17.0 g/dL 16.5  17.6High    HCT 39.0 - 52.0 % 48.5  51.6   MCV 78.0 - 100.0 fl 92.3  93.0   MCHC 30.0 - 36.0 g/dL 34.0  34.2   RDW 11.5 - 15.5 % 14.0  13.5   Platelets 150.0 - 400.0 K/uL 235.0  195.0   Neutrophils Relative % 43.0 - 77.0 % 65.8  32.8Low    Lymphocytes Relative 12.0 - 46.0 % 21.9  23.5   Monocytes Relative 3.0 - 12.0 % 8.4  5.8   Eosinophils Relative 0.0 - 5.0 % 3.4  36.5High  CM  Basophils Relative 0.0 - 3.0 % 0.5  1.4   Neutro Abs 1.4 - 7.7 K/uL 6.6  5.2   Lymphs Abs 0.7 - 4.0 K/uL 2.2  3.7   Monocytes Absolute 0.1 - 1.0 K/uL 0.8  0.9   Eosinophils Absolute 0.0 - 0.7 K/uL 0.3  5.8High    Basophils Absolute 0.0 - 0.1 K/uL 0.0  0.2High    Resulting Agency  [facility]      Specimen Collected: 12/12/17 17:25 Last Resulted: 12/12/17 17:37     Lab Flowsheet    Order Details    View Encounter    Lab and Collection Details    Routing    Result History      CM=Additional comments        Result Notes for Aspergillus IgE Panel   Notes recorded by Rinaldo Ratel, CMA on 12/30/2017 at 3:23 PM EDT LMOM TCB x1. ------  Notes recorded by Melvenia Needles, NP on 12/30/2017 at 10:24 AM EDT Allergy labs did not show aspergillus .  Cont w. Ov recs and follow up with Allergist.  Please contact office for sooner follow up if symptoms do not improve or worsen or seek emergency care    ------  Notes recorded by Rinaldo Ratel, CMA on 12/23/2017 at 5:08 PM EDT Called spoke with patient, advised of lab results / recs as stated by TP. Pt  verbalized understanding and denied any questions.  ------  Notes recorded by Melvenia Needles, NP on 12/23/2017 at 9:30 AM EDT Aspergillus panel pending  Rest of labs are improved ESR decreased, eosinophils Dropped dramatically that is great  ANCA screen is neg.  Discuss in detail with Dr. Lamonte Sakai Ov  Please contact office for sooner follow up if symptoms do not improve or worsen or seek emergency care   Aspergillus IgE Panel  Order: [id number]   Status:  Final result Visible to patient:  No (Not Released) Next appt:  01/01/2018 at 04:30 PM in Radiology ([facility]CT-CT 1) Dx:  Cough   Ref Range & Units 2wk ago  Aspergillus fumigatus IgE <0.35 kU/L <0.10   A. Fumigatus Class Interp  0   Aspergillus [place] IgE <0.35 kU/L <0.10   A. [place] Class Interp  0   Aspergillus versicolor IgE <0.35 kU/L <0.10   A. Versicolor Class Interp  0   Comment: The test method is the [employer] ImmunoCAP allergen-specific  IgE system. CLASS INTERPRETATION  <0.10 kU/L= 0,  Negative; 0.10 - 0.34 kU/L= 0/1, Equivocal/Borderline;  0.35 - 0.69 kU/L=1, Low Positive; 0.70 - 3.49 kU/L=2,  Moderate Positive; 3.50 - 17.49 kU/L=3, High Positive;  17.50 - 49.99 kU/L= 4, Very High Positive; 50.00 - 99.99  kU/L= 5, Very High Positive;  >99.99 kU/L=6, Very High  Positive  Aspergillus amstel/glaucu IgE* <0.35 kU/L <0.35   A. Amstel/Glaucu Class Interp  0   Aspergillus flavus IgE <0.35 kU/L <0.35   A. Flavus Class Interp  0   Aspergillus nidulans IgE <0.35 kU/L <0.35   A. Nidulans Class Interp  0   Aspergillus terreus IgE <0.35 kU/L <0.35   A. Terreus Class Interp  0   Comment: This conventional EIA uses allergen-coated discs from  several suppliers and an enzyme-labeled anti-IgE. CLASS  INTERPRETATION: <0.35 kU/L=0, Below Detection; 0.35-0.69  kU/L= 1, Low Positive; 0.70-3.49 kU/L= 2, Moderate  Positive; 3.50-17.49 kU/L= 3, Positive; 17.50-49.99 kU/L=  4, Strong Positive; 50.00-99.99 kU/L= 5, Very Strong  Positive;  >99.99 kU/L= 6, Very Strong Positive  *This test was developed and its performance  characteristics determined by [employer]. It has not  been cleared or approved by the U.S. Food and Drug  Administration.   Resulting Agency  [facility]    Narrative  Performed by: [facility]  Performed at: [facility] [address] [place], [place] [id number] Lab Director: Bernadene Bell PhD, Phone: [phone]    Specimen Collected: 12/12/17 17:25 Last Resulted: 12/30/17 05:37     Lab Flowsheet    Order Details    View Encounter    Lab and Collection Details    Routing    Result History          Result Notes for ANCA screen with reflex titer   Notes recorded by Rinaldo Ratel, CMA on 12/30/2017 at 3:23 PM EDT LMOM TCB x1. ------  Notes recorded by Melvenia Needles, NP on 12/30/2017 at 10:24 AM EDT Allergy labs did not show aspergillus .  Cont w. Ov recs and follow up with Allergist.  Please contact office for sooner follow up if symptoms do not improve or worsen or seek emergency care    ------  Notes recorded by Rinaldo Ratel, CMA on 12/23/2017 at 5:08 PM EDT Called spoke with patient, advised of lab results / recs as stated by TP. Pt verbalized understanding and denied any questions.  ------  Notes recorded by Melvenia Needles, NP on 12/23/2017 at 9:30 AM EDT Aspergillus panel pending  Rest of labs are improved ESR decreased, eosinophils Dropped dramatically that is great  ANCA screen is neg.  Discuss in detail with Dr. Lamonte Sakai Ov  Please contact office for sooner follow up if symptoms do not improve or worsen or seek emergency care       All Reviewers List   Parrett, Fonnie Mu, NP on 12/30/2017 10:24  Rinaldo Ratel, CMA on 12/23/2017 17:08  Parrett, Fonnie Mu, NP on 12/23/2017 09:30  Encounter   View Encounter       Result Information   Status: Final result (Collected: 12/12/2017 17:25) Provider Status: Reviewed    Lab Information    [facility]    Order-Level Documents:   There are no order-level documents.  View SmartLink Info   ANCA screen with reflex titer (Order #[id number]) on 12/12/17  Result Read / Acknowledged   Acknowledge result  User Time Read / Charlynn Court, Fonnie Mu, NP 12/23/2017 9:30 AM  Rinaldo Ratel, CMA 12/23/2017 5:08 PM

## 2018-01-01 ENCOUNTER — Ambulatory Visit (INDEPENDENT_AMBULATORY_CARE_PROVIDER_SITE_OTHER)
Admission: RE | Admit: 2018-01-01 | Discharge: 2018-01-01 | Disposition: A | Discharge status: Home / Self Care | Payer: 59 | Source: Ambulatory Visit | Attending: Adult Health | Admitting: Adult Health

## 2018-01-01 DIAGNOSIS — R05 Cough: Secondary | ICD-10-CM

## 2018-01-01 DIAGNOSIS — R059 Cough, unspecified: Secondary | ICD-10-CM

## 2018-01-01 DIAGNOSIS — R918 Other nonspecific abnormal finding of lung field: Secondary | ICD-10-CM | POA: Diagnosis not present

## 2018-01-06 ENCOUNTER — Ambulatory Visit: Payer: 59 | Admitting: Emergency Medicine

## 2018-01-06 ENCOUNTER — Encounter: Payer: Self-pay | Admitting: Emergency Medicine

## 2018-01-06 ENCOUNTER — Other Ambulatory Visit (INDEPENDENT_AMBULATORY_CARE_PROVIDER_SITE_OTHER): Payer: 59

## 2018-01-06 DIAGNOSIS — J4551 Severe persistent asthma with (acute) exacerbation: Secondary | ICD-10-CM | POA: Diagnosis not present

## 2018-01-06 DIAGNOSIS — J301 Allergic rhinitis due to pollen: Secondary | ICD-10-CM

## 2018-01-06 DIAGNOSIS — J309 Allergic rhinitis, unspecified: Secondary | ICD-10-CM | POA: Insufficient documentation

## 2018-01-06 LAB — CBC WITH DIFFERENTIAL/PLATELET
BASOS ABS: 0 10*3/uL (ref 0.0–0.1)
BASOS PCT: 0.5 % (ref 0.0–3.0)
EOS ABS: 1.4 10*3/uL — AB (ref 0.0–0.7)
Eosinophils Relative: 13.2 % — ABNORMAL HIGH (ref 0.0–5.0)
HCT: 46.1 % (ref 39.0–52.0)
Hemoglobin: 16.1 g/dL (ref 13.0–17.0)
LYMPHS ABS: 1.6 10*3/uL (ref 0.7–4.0)
Lymphocytes Relative: 14.9 % (ref 12.0–46.0)
MCHC: 35 g/dL (ref 30.0–36.0)
MCV: 91.4 fl (ref 78.0–100.0)
Monocytes Absolute: 1.1 10*3/uL — ABNORMAL HIGH (ref 0.1–1.0)
Monocytes Relative: 10.4 % (ref 3.0–12.0)
NEUTROS ABS: 6.4 10*3/uL (ref 1.4–7.7)
NEUTROS PCT: 61 % (ref 43.0–77.0)
PLATELETS: 187 10*3/uL (ref 150.0–400.0)
RBC: 5.04 Mil/uL (ref 4.22–5.81)
RDW: 13.8 % (ref 11.5–15.5)
WBC: 10.6 10*3/uL — ABNORMAL HIGH (ref 4.0–10.5)

## 2018-01-06 MED ORDER — ALBUTEROL SULFATE HFA 108 (90 BASE) MCG/ACT IN AERS: 2.0000 | INHALATION_SPRAY | RESPIRATORY_TRACT | 5 refills | Status: DC | PRN

## 2018-01-06 MED ORDER — ALBUTEROL SULFATE (2.5 MG/3ML) 0.083% IN NEBU: 2.5000 mg | INHALATION_SOLUTION | RESPIRATORY_TRACT | 5 refills | Status: DC | PRN

## 2018-01-06 MED ORDER — BENZONATATE 200 MG PO CAPS: 200.0000 mg | ORAL_CAPSULE | Freq: Three times a day (TID) | ORAL | 1 refills | Status: DC | PRN

## 2018-01-06 MED ORDER — PREDNISONE 10 MG PO TABS: 50.0000 mg | ORAL_TABLET | Freq: Every day | ORAL | 0 refills | Status: DC

## 2018-01-06 NOTE — Patient Instructions (Addendum)
Follow with Allergist in mid July as planned.  Take prednisone 50 mg daily for 7 days and then stop.  Continue your Dulera 2 puffs twice a day.  Rinse and gargle after using. Continue Singulair once a day Continue Zyrtec and Flonase as you are taking them. We will start the process to possibly obtain Nucala in case you have persistent asthma symptoms even after we have better addressed your allergic disease. Blood work (IgE level and eosinophil count) today Follow with Dr. Delton CoombesByrum in July after your allergy testing, approximately 1 month.

## 2018-01-06 NOTE — Assessment & Plan Note (Signed)
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  His symptoms are certainly out of proportion.  He has a profoundly elevated IgE, eosinophil count.  I will recheck these today.  No evidence for ABPA on any of his other studies including CT chest, Aspergillus IgE, etc.  If he needs better allergy control and we are working on this.  I also want to be prepared for possible immunotherapy.  His IgE is elevated to the point where Xolair is not an option.  I will work on [employer]ucala  Continue your Dulera 2 puffs twice a day.  Rinse and gargle after using. Continue Singulair once a day We will start the process to possibly obtain Nucala in case you have persistent asthma symptoms even after we have better addressed your allergic disease. Blood work (IgE level and eosinophil count) today Follow with Dr. Delton CoombesByrum in July after your allergy testing, approximately 1 month.

## 2018-01-06 NOTE — Assessment & Plan Note (Signed)
Follow with Allergist in mid July as planned.  Take prednisone 50 mg daily for 7 days and then stop.  Continue Zyrtec and Flonase as you are taking them. Follow with Dr. Delton CoombesByrum in July after your allergy testing, approximately 1 month.

## 2018-01-06 NOTE — Progress Notes (Signed)
Subjective:    Patient ID: Jose Ross, male    DOB: 01-May-1971, 47 y.o.   MRN: [id number]  Shortness of Breath  Associated symptoms include headaches and wheezing. Pertinent negatives include no ear pain, fever, leg swelling, rash, rhinorrhea, sore throat or vomiting.   47 year old former smoker (15 pk-yrs) with little PMH beyond contact dermatitis, here today for evaluation of dyspnea.  Was well until early April, began to have cough, noise with breathing / wheeze, then some evolving dyspnea w exertion. Cough was initially productive, now not. Has continued to have dyspnea, cough even at night awakening. Now has a lot of clear nasal nasal drainage. He does have some GERD sx several times a week.  He has been seen at Urgent Care and treated with azithro, pred, then again on 2 occasions. Has been using albuterol frequently. Then was treated with levaquin yesterday at another UC in [place].   ROV 12/23/17 --this follow-up visit for patient with a minimal tobacco history with dyspnea and clinical history consistent with asthma.  He has been treated for what sounds like recurrent exacerbations and has been found to have extremely high exhaled nitric oxide level, eosinophil count, IgE.  He was last seen in our office 5/31 by T Parrett. He is now on Dulera and Singulair, completed a pred taper 2 days ago. He is using albuterol about 2x a day. RAST testing > pending. His congestion and cough are better. He still has some dyspnea, chest tightness. CXR 5/23 > bronchitis changes. A CT chest is pending. His FENO today is 74 . 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  He has frequent exacerbations.  He has bony function testing on 12/29/2017 and I have reviewed.  This shows mild obstruction and a positive bronchodilator response, normal lung volumes, normal diffusion capacity.  We have managed him on  Dulera, Singulair, Zyrtec, currently on flonase. He tried nasal saline - couldn't tolerate it.   We referred him for allergy evaluation, will be seen next month. He is having much more nasal gtt, associated dyspnea.  A CT scan of his chest done on 01/02/2018 was reviewed by me and shows no evidence for interstitial disease, some nonspecific scattered pulmonary nodules without a pattern consistent with opportunistic infection or atypical infection.  Some evidence for mild air trapping and small airways disease.    Review of Systems  Constitutional: Negative for fever and unexpected weight change.  HENT: Positive for congestion, nosebleeds, sinus pressure and sneezing. Negative for dental problem, ear pain, postnasal drip, rhinorrhea, sore throat and trouble swallowing.   Eyes: Negative for redness and itching.  Respiratory: Positive for cough, chest tightness, shortness of breath and wheezing.   Cardiovascular: Negative for palpitations and leg swelling.  Gastrointestinal: Negative for nausea and vomiting.  Genitourinary: Negative for dysuria.  Musculoskeletal: Negative for joint swelling.  Skin: Negative for rash.  Allergic/Immunologic: Positive for environmental allergies. Negative for food allergies and immunocompromised state.  Neurological: Positive for headaches.  Hematological: Does not bruise/bleed easily.  Psychiatric/Behavioral: Negative for dysphoric mood. The patient is not nervous/anxious.     No past medical history on file.   No family history on file.   Social History   Socioeconomic History  . Marital status: Divorced    Spouse name: Not on file  . Number of children: Not on file  . Years of education: Not on file  .  Highest education level: Not on file  Occupational History  . Not on file  Social Needs  . Financial resource strain: Not on file  . Food insecurity:    Worry: Not on file    Inability: Not on file  . Transportation needs:    Medical: Not on file     Non-medical: Not on file  Tobacco Use  . Smoking status: Never Smoker  . Smokeless tobacco: Never Used  Substance and Sexual Activity  . Alcohol use: Not on file  . Drug use: Not on file  . Sexual activity: Not on file  Lifestyle  . Physical activity:    Days per week: Not on file    Minutes per session: Not on file  . Stress: Not on file  Relationships  . Social connections:    Talks on phone: Not on file    Gets together: Not on file    Attends religious service: Not on file    Active member of club or organization: Not on file    Attends meetings of clubs or organizations: Not on file    Relationship status: Not on file  . Intimate partner violence:    Fear of current or ex partner: Not on file    Emotionally abused: Not on file    Physically abused: Not on file    Forced sexual activity: Not on file  Other Topics Concern  . Not on file  Social History Narrative  . 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   No Known Allergies   Outpatient Medications Prior to Visit  Medication Sig Dispense Refill  . mometasone-formoterol (DULERA) 200-5 MCG/ACT AERO Inhale 2 puffs into the lungs 2 (two) times daily. 1 Inhaler 5  . montelukast (SINGULAIR) 10 MG tablet Take 1 tablet (10 mg total) by mouth at bedtime. 30 tablet 11  . albuterol (PROVENTIL HFA;VENTOLIN HFA) 108 (90 Base) MCG/ACT inhaler Inhale 2 puffs into the lungs every 4 (four) hours as needed for wheezing or shortness of breath (cough, shortness of breath or wheezing.). 1 Inhaler 1  . albuterol (PROVENTIL) (2.5 MG/3ML) 0.083% nebulizer solution Take 3 mLs (2.5 mg total) by nebulization every 4 (four) hours as needed for wheezing or shortness of breath (dx: J45.51). 75 mL 5  . benzonatate (TESSALON) 200 MG capsule Take 1 capsule (200 mg total) by mouth 2 (two) times daily as needed for cough. 20 capsule 1    No facility-administered medications prior to visit.         Objective:   Physical Exam Vitals:   01/06/18 1135  BP: 126/84  Pulse: (!) 110  SpO2: 98%  Weight: 235 lb (106.6 kg)  Height: 5\' 10"  (1.778 m)   Gen: Pleasant, well-nourished, in no distress,  normal affect  ENT: No lesions,  mouth clear,  oropharynx clear but crowded significant PND and nasal obstruction  Neck: No JVD, no stridor   Lungs: No use of accessory muscles, few scattered end exp wheezes.   Cardiovascular: RRR, heart sounds normal, no murmur or gallops, no peripheral edema  Musculoskeletal: No deformities, no cyanosis or clubbing  Neuro: alert, non focal  Skin: Warm, no lesions or rash     Assessment & Plan:  Allergic rhinitis Follow with Allergist in mid July as planned.  Take prednisone 50 mg daily for 7 days and then stop.  Continue Zyrtec and Flonase as you are taking them. Follow with Dr. Delton CoombesByrum in July after your allergy testing, approximately 1 month.  Asthma Mild obstruction confirmed on his pulmonary function testing.  His symptoms are certainly out of proportion.  He has a profoundly elevated IgE, eosinophil count.  I will recheck these today.  No evidence for ABPA on any of his other studies including CT chest, Aspergillus IgE, etc.  If he needs better allergy control and we are working on this.  I also want to be prepared for possible immunotherapy.  His IgE is elevated to the point where Xolair is not an option.  I will work on [employer]  Continue your Dulera 2 puffs twice a day.  Rinse and gargle after using. Continue Singulair once a day We will start the process to possibly obtain Nucala in case you have persistent asthma symptoms even after we have better addressed your allergic disease. Blood work (IgE level and eosinophil count) today Follow with Dr. Delton CoombesByrum in July after your allergy testing, approximately 1 month.  Levy Pupaobert Hadeel Hillebrand, MD, PhD 01/06/2018, 12:07 PM [facility] [phone] or if no answer [phone]

## 2018-01-07 LAB — IGE: IGE (IMMUNOGLOBULIN E), SERUM: 588 kU/L — AB (ref <=114)

## 2018-01-08 ENCOUNTER — Ambulatory Visit: Payer: 59 | Admitting: Emergency Medicine

## 2018-01-12 ENCOUNTER — Telehealth: Payer: Self-pay | Admitting: Emergency Medicine

## 2018-01-12 NOTE — Telephone Encounter (Signed)
Jose Ross  please advise.

## 2018-01-13 NOTE — Telephone Encounter (Signed)
I returned [employer]'s call. The rep that called earlier missed the checked boxes. The rep I spoke to went by the note put in by Tamia. When she opened the file she saw the boxes of how we are wanting to acquire nucala were already checked.

## 2018-01-13 NOTE — Telephone Encounter (Signed)
Paper was signed and faxed. Waiting for summary of benefits.

## 2018-01-13 NOTE — Telephone Encounter (Signed)
Jose Ross, with [facility] calling regarding problem with form recd.  Return call to [phone].

## 2018-01-16 NOTE — Telephone Encounter (Signed)
I called [employer] back his summary of benefits is in process. [employer] is contacting his ins.. Rep said to call back Mon..Marland Kitchen

## 2018-01-19 ENCOUNTER — Telehealth: Payer: Self-pay | Admitting: Emergency Medicine

## 2018-01-19 NOTE — Telephone Encounter (Signed)
lmtcb for [employer]

## 2018-01-19 NOTE — Telephone Encounter (Signed)
Still waiting to hear something from [employer], Pharm or pt..Marland Kitchen

## 2018-01-20 NOTE — Telephone Encounter (Signed)
Attempted to call [employer]. I did not receive an answer. I have left a message for Percell Beltriana to return our call.

## 2018-01-21 NOTE — Telephone Encounter (Signed)
Will send clinical notes and tx regimen to [facility] for P/A as requested. Can't send LMN b/c RB is not here.

## 2018-01-21 NOTE — Telephone Encounter (Signed)
Attempted to call Ariana. I did not receive an answer. I have left a message for Ariana to return our call.  

## 2018-01-22 NOTE — Telephone Encounter (Signed)
We have attempted to contact Jose Ross several times with no success or call back. Per triage protocol, message will be closed.

## 2018-01-23 ENCOUNTER — Telehealth: Payer: Self-pay | Admitting: Emergency Medicine

## 2018-01-23 NOTE — Telephone Encounter (Signed)
[facility] She is requesting all of the pt's OV and work notes from our office. These have been faxed to Amy at [phone]. I also advised Amy that we did receive the form from them on the pt's work restrictions but RB is not back in the office until 02/03/18. She verbalized understanding. Nothing further was needed at this time.

## 2018-01-27 NOTE — Telephone Encounter (Signed)
I received a fax from [employer] that Pt's ins will be sending me pertinent info to begin pt's predetermination. I have not received anything from them yet , I'll call his ins. Co. Tomorrow.

## 2018-01-29 NOTE — Telephone Encounter (Signed)
I called pt's ins co., the Rep gave me a ref.#: [id number]: [id number], Dates: 01/28/18 thru 04/22/18, it will take 3 to 5 bus. days to process.The rep also gave me a fax # to fax clinical notes, labs med list and a copy of the ins. Card. I faxed the info and got the transmission log that it went thru this morning. Waiting to hear if pt is approved or not.

## 2018-02-02 NOTE — Telephone Encounter (Addendum)
I haven't heard from his ins company yet. 02/04/18 will be 5 bus. Days. If I haven't heard from them by then, I'll call them back.

## 2018-02-03 ENCOUNTER — Telehealth: Payer: Self-pay | Admitting: Emergency Medicine

## 2018-02-03 MED ORDER — EPINEPHRINE 0.3 MG/0.3ML IJ SOAJ: 0.3000 mg | Freq: Once | INTRAMUSCULAR | 11 refills | Status: AC

## 2018-02-03 NOTE — Telephone Encounter (Signed)
Called pt to ask him to call [employer] and give his consent to ship. He said "I did." He spent 45 mins on the phone doing the new pt enrollment and giving his consent. So I called [employer] back, and the rep I spoke with said pt's consent wasn't recorded in that note. Rep. Said he would call the pt. Back and not give up til he reaches the pt and can ship del today for tomorrow. I asked the rep if the pt can give his consent for the yr.? "Yes."  I will put th order in nucala smart text, then call the pt to make sure [facility] got in touch with him.

## 2018-02-03 NOTE — Telephone Encounter (Signed)
Jose Ross with [employer] calling to schedule Nucala delivery.  CB is [phone].

## 2018-02-03 NOTE — Telephone Encounter (Signed)
Rx sent 

## 2018-02-03 NOTE — Telephone Encounter (Signed)
1 Vial Order Date: 02/03/18 Shipping Date: 02/03/18

## 2018-02-04 DIAGNOSIS — J4551 Severe persistent asthma with (acute) exacerbation: Secondary | ICD-10-CM | POA: Diagnosis not present

## 2018-02-04 NOTE — Telephone Encounter (Addendum)
[facility] rescheduled nucala for 02/05/18 b/c they said they were unable to get pt's consent 02/03/18 for del. Today.  I spoke with the pt yesterday afternoon and he gave his consent and his nucala was coming in today. 1 Vial [facility] sent PFS  Ship Date: 02/03/18 Shipping Date: 02/04/18

## 2018-02-04 NOTE — Telephone Encounter (Signed)
Called pt to let him know, I called [employer] b/c they did not ship his Dupixent as they said they would. Lmom, making pt aware of the before mentioned. Also to let him know it should still be here by this Fri. He wants to get his shot while he is here to see RB.

## 2018-02-05 NOTE — Telephone Encounter (Addendum)
.  nu  Error, I was in order encounter then all of a sudden I was in this one.  Leaving encounter open. I'll ask the Ladies up front also to put pt on my sch.. That way I can  record if pt has a reaction or symptoms to his 1st  Nucala inj.Marland Kitchen..Marland Kitchen

## 2018-02-05 NOTE — Telephone Encounter (Signed)
1 PFS Arrival Date:02/05/18 Lot #: [id number] Exp Date:09/2019

## 2018-02-06 ENCOUNTER — Encounter: Payer: Self-pay | Admitting: Emergency Medicine

## 2018-02-06 ENCOUNTER — Ambulatory Visit: Payer: 59 | Admitting: Emergency Medicine

## 2018-02-06 VITALS — BP 146/98 | HR 78 | Ht 70.0 in | Wt 255.0 lb

## 2018-02-06 DIAGNOSIS — J4550 Severe persistent asthma, uncomplicated: Secondary | ICD-10-CM | POA: Diagnosis not present

## 2018-02-06 MED ORDER — PREDNISONE 10 MG PO TABS: ORAL_TABLET | ORAL | 0 refills | Status: DC

## 2018-02-06 MED ORDER — METHYLPREDNISOLONE ACETATE 80 MG/ML IJ SUSP
120.0000 mg | Freq: Once | INTRAMUSCULAR | Status: AC
  Administered 2018-02-06: 120 mg via INTRAMUSCULAR

## 2018-02-06 NOTE — Progress Notes (Signed)
Subjective:    Patient ID: Jose Ross, male    DOB: 07/04/1971, 47 y.o.   MRN: [id number]  Shortness of Breath  Associated symptoms include headaches and wheezing. Pertinent negatives include no ear pain, fever, leg swelling, rash, rhinorrhea, sore throat or vomiting.   47 year old former smoker (15 pk-yrs) with little PMH beyond contact dermatitis, here today for evaluation of dyspnea.  Was well until early April, began to have cough, noise with breathing / wheeze, then some evolving dyspnea w exertion. Cough was initially productive, now not. Has continued to have dyspnea, cough even at night awakening. Now has a lot of clear nasal nasal drainage. He does have some GERD sx several times a week.  He has been seen at Urgent Care and treated with azithro, pred, then again on 2 occasions. Has been using albuterol frequently. Then was treated with levaquin yesterday at another UC [place].   ROV 12/23/17 --this follow-up visit for patient with a minimal tobacco history with dyspnea and clinical history consistent with asthma.  He has been treated for what sounds like recurrent exacerbations and has been found to have extremely high exhaled nitric oxide level, eosinophil count, IgE.  He was last seen in our office 5/31 by T Parrett. He is now on Dulera and Singulair, completed a pred taper 2 days ago. He is using albuterol about 2x a day. RAST testing > pending. His congestion and cough are better. He still has some dyspnea, chest tightness. CXR 5/23 > bronchitis changes. A CT chest is pending. His FENO today is 74 . 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  He has frequent exacerbations.  He has bony function testing on 12/29/2017 and I have reviewed.  This shows mild obstruction and a positive bronchodilator response, normal lung volumes, normal diffusion capacity.  We have managed him on  Dulera, Singulair, Zyrtec, currently on flonase. He tried nasal saline - couldn't tolerate it.   We referred him for allergy evaluation, will be seen next month. He is having much more nasal gtt, associated dyspnea.  A CT scan of his chest done on 01/02/2018 was reviewed by me and shows no evidence for interstitial disease, some nonspecific scattered pulmonary nodules without a pattern consistent with opportunistic infection or atypical infection.  Some evidence for mild air trapping and small airways disease.   ROV 02/06/18 --follow-up visit for 47 year old man with asthma, eosinophilic phenotype with an elevated IgE.  He has mild obstruction with a positive bronchodilator response by spirometry.  He has been on Dulera, Singulair, Zyrtec and Flonase.  Allergy evaluation was arranged.  We have initiated the process of obtaining Nucala when you find out from time. He continues to have chest tightness, dyspnea, cough that wakes him form sleep. He does get relief from nebulized albuterol. He is supposed to see allergy later this month.   IgE 588 (01/06/2018 Eosinophil 13.2%, 1.4K per microliter (01/06/18)    Review of Systems  Constitutional: Negative for fever and unexpected weight change.  HENT: Positive for congestion, nosebleeds, sinus pressure and sneezing. Negative for dental problem, ear pain, postnasal drip, rhinorrhea, sore throat and trouble swallowing.   Eyes: Negative for redness and itching.  Respiratory: Positive for cough, chest tightness, shortness of breath and wheezing.   Cardiovascular: Negative for palpitations and leg swelling.  Gastrointestinal: Negative for nausea and vomiting.  Genitourinary: Negative for dysuria.  Musculoskeletal: Negative  for joint swelling.  Skin: Negative for rash.  Allergic/Immunologic: Positive for environmental allergies. Negative for food allergies and immunocompromised state.  Neurological: Positive for headaches.  Hematological: Does not bruise/bleed  easily.  Psychiatric/Behavioral: Negative for dysphoric mood. The patient is not nervous/anxious.     No past medical history on file.   No family history on file.   Social History   Socioeconomic History  . Marital status: Divorced    Spouse name: Not on file  . Number of children: Not on file  . Years of education: Not on file  . Highest education level: Not on file  Occupational History  . Not on file  Social Needs  . Financial resource strain: Not on file  . Food insecurity:    Worry: Not on file    Inability: Not on file  . Transportation needs:    Medical: Not on file    Non-medical: Not on file  Tobacco Use  . Smoking status: Never Smoker  . Smokeless tobacco: Never Used  Substance and Sexual Activity  . Alcohol use: Not on file  . Drug use: Not on file  . Sexual activity: Not on file  Lifestyle  . Physical activity:    Days per week: Not on file    Minutes per session: Not on file  . Stress: Not on file  Relationships  . Social connections:    Talks on phone: Not on file    Gets together: Not on file    Attends religious service: Not on file    Active member of club or organization: Not on file    Attends meetings of clubs or organizations: Not on file    Relationship status: Not on file  . Intimate partner violence:    Fear of current or ex partner: Not on file    Emotionally abused: Not on file    Physically abused: Not on file    Forced sexual activity: Not on file  Other Topics Concern  . Not on file  Social History Narrative  . 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   No Known Allergies   Outpatient Medications Prior to Visit  Medication Sig Dispense Refill  . albuterol (PROVENTIL HFA;VENTOLIN HFA) 108 (90 Base) MCG/ACT inhaler Inhale 2 puffs into the lungs every 4 (four) hours as needed for wheezing or shortness of breath  (cough, shortness of breath or wheezing.). 1 Inhaler 5  . albuterol (PROVENTIL) (2.5 MG/3ML) 0.083% nebulizer solution Take 3 mLs (2.5 mg total) by nebulization every 4 (four) hours as needed for wheezing or shortness of breath. 150 mL 5  . benzonatate (TESSALON) 200 MG capsule Take 1 capsule (200 mg total) by mouth 3 (three) times daily as needed for cough. 45 capsule 1  . mometasone-formoterol (DULERA) 200-5 MCG/ACT AERO Inhale 2 puffs into the lungs 2 (two) times daily. 1 Inhaler 5  . montelukast (SINGULAIR) 10 MG tablet Take 1 tablet (10 mg total) by mouth at bedtime. 30 tablet 11  . predniSONE (DELTASONE) 10 MG tablet Take 5 tablets (50 mg total) by mouth daily with breakfast. 35 tablet 0   No facility-administered medications prior to visit.         Objective:   Physical Exam Vitals:   02/06/18 1131  BP: (!) 146/98  Pulse: 78  SpO2: 95%  Weight: 255 lb (115.7  kg)  Height: 5\' 10"  (1.778 m)   Gen: Pleasant, well-nourished, mild resp distress,  normal affect  ENT: No lesions,  mouth clear,  oropharynx clear but crowded significant PND and nasal obstruction  Neck: No JVD, no stridor   Lungs: No use of accessory muscles, severe wheeze all fields.   Cardiovascular: RRR, heart sounds normal, no murmur or gallops, no peripheral edema  Musculoskeletal: No deformities, no cyanosis or clubbing  Neuro: alert, non focal  Skin: Warm, no lesions or rash     Assessment & Plan:  Asthma Solumedrol injection today Albuterol nebulizer today Take prednisone as directed: take 40mg  daily for 1 week, then 30mg  for one week, then stay on 20mg  daily until next visit Continue Dulera twice a day Continue singulair Keep albuterol available to use 2 puffs or one nebulizer up to every 4 hours if needed for shortness of breath. Follow with Allergist as planned.  We will start your Nucala injections once your wheezing has improved. Please call our office next week to coordinate with [facility].   We have completed your [employer] paperwork for work absence. You cannot return to work at this time. We will have to reassess prior to Sept 1, 2019.  Follow with Dr Delton Coombes in 1 month  Allergic rhinitis Follow with Allergist as planned. For skin testing and hopefully immunotherapy  Levy Pupa, MD, PhD 02/06/2018, 12:05 PM [facility] [phone] or if no answer [phone]

## 2018-02-06 NOTE — Assessment & Plan Note (Signed)
Solumedrol injection today Albuterol nebulizer today Take prednisone as directed: take 40mg  daily for 1 week, then 30mg  for one week, then stay on 20mg  daily until next visit Continue Dulera twice a day Continue singulair Keep albuterol available to use 2 puffs or one nebulizer up to every 4 hours if needed for shortness of breath. Follow with Allergist as planned.  We will start your Nucala injections once your wheezing has improved. Please call our office next week to coordinate with [facility].  We have completed your [employer] paperwork for work absence. You cannot return to work at this time. We will have to reassess prior to Sept 1, 2019.  Follow with Dr Delton CoombesByrum in 1 month

## 2018-02-06 NOTE — Telephone Encounter (Signed)
RB saw pt today, he was too sick to start Nucala today. (per RB) RB stated in his ov notes, Please call Tammy Scott next wk to sch his appt.. I'll call him if he hasn't called by Wed..Marland Kitchen

## 2018-02-06 NOTE — Assessment & Plan Note (Signed)
Follow with Allergist as planned. For skin testing and hopefully immunotherapy

## 2018-02-06 NOTE — Patient Instructions (Addendum)
Solumedrol injection today Albuterol nebulizer today Take prednisone as directed: take 40mg  daily for 1 week, then 30mg  for one week, then stay on 20mg  daily until next visit Continue Dulera twice a day Keep albuterol available to use 2 puffs or one nebulizer up to every 4 hours if needed for shortness of breath. Follow with Allergist as planned.  We will start your Nucala injections once your wheezing has improved. Please call our office next week to coordinate with [facility].  We have completed your [employer] paperwork for work absence. You cannot return to work at this time. We will have to reassess prior to Sept 1, 2019.  Follow with Dr Delton CoombesByrum in 1 month

## 2018-02-09 ENCOUNTER — Telehealth: Payer: Self-pay | Admitting: Emergency Medicine

## 2018-02-09 NOTE — Telephone Encounter (Signed)
Called Jose Ross back. I had to leave a message. Pt. nucala is already here, asked Jose HarmanDana to call me back and let me know what's going on. I called the P/A in, in mid July, have a ref. # and eff. Dates.

## 2018-02-09 NOTE — Telephone Encounter (Signed)
Will route to Tammy Scott to follow up on.  

## 2018-02-10 NOTE — Telephone Encounter (Signed)
Annabelle Harmanana, [facility], calling to confirm information about the PA for this pt's Nucala. Cb is [phone].

## 2018-02-10 NOTE — Telephone Encounter (Signed)
Routing message to TS for update if pt has rescheduled injection TS please advise

## 2018-02-10 NOTE — Telephone Encounter (Signed)
I returned [name]'s call from this morning. Got her vm. Will sykpe ladies up front to put her thru when she calls back.

## 2018-02-12 ENCOUNTER — Ambulatory Visit (INDEPENDENT_AMBULATORY_CARE_PROVIDER_SITE_OTHER): Payer: 59

## 2018-02-12 ENCOUNTER — Encounter: Payer: Self-pay | Admitting: Pulmonary Disease

## 2018-02-12 DIAGNOSIS — J4550 Severe persistent asthma, uncomplicated: Secondary | ICD-10-CM

## 2018-02-12 NOTE — Progress Notes (Addendum)
Asked to assess patient as he was feeling jittery after his first dose of nucala inj He used his albuterol just before the injection Patient examined in the infusion room  States that he has mild anxiety, dyspnea that is stable Denies any swelling of the tongue, lips, stridor, difficulty swallowing.  No rash, urticaria  Blood pressure 124/96, O2 sats 94% on room air, heart rate 102 Gen:      No acute distress, speaking full sentences HEENT:  EOMI, sclera anicteric Neck:     No masses; no thyromegaly Lungs:   Bilateral expiratory wheeze. Not using accessory muscles. CV:         Regular rate and rhythm; no murmurs Abd:      + bowel sounds; soft, non-tender; no palpable masses, no distension Ext:    No edema; adequate peripheral perfusion Skin:      Warm and dry; no rash Neuro: alert and oriented x 3 Psych: normal mood and affect  Assessment/plan Severe persistent asthma Got his first dose of Nucala today  No evidence of allergic reaction to nucala on my examination He is wheezing as noted in recent clinic visit on 7/26 Currently on prednisone at 40 mg.  I have asked him to increase it to 60 mg through the weekend and resume taper as directed by Dr. Delton Ross starting 8/5 I have instructed him to call us or go to the emergency room if there is any worsening of symptoms.  Jose GreathousePraveen Melaney Tellefsen MD [facility] 02/12/2018, 12:21 PM

## 2018-02-13 NOTE — Telephone Encounter (Signed)
Spoke to [name] gave her the info. She needed. Nothing further needed.

## 2018-02-13 NOTE — Telephone Encounter (Signed)
Pt called, I set up his 2 hr. Appt. Virginia Crews(Nucala) And asked him to bring his Epi-Pen in with him and let me see it. He did.  I monitored him q 15 to 20 mins. Pt took a breathing tx that morning and was jittery. It was 20 to 40 mins in and the pt was still jittery. Mannam was DOD the morning, he asked me to take his vitals.Bp 124/96 HR 102.  Dr. Judie PetitM said it wasn't b/c of the shot. He listened to the pt's chest pt is still wheezing after several days of pred.(40mg ) Dr.M suggested he take 60mg  thru the weekend and resume taper as directed by RB. Nothing further needed.

## 2018-02-13 NOTE — Telephone Encounter (Signed)
TS please advise if you have be able to contact pt.

## 2018-02-13 NOTE — Telephone Encounter (Signed)
TS please advise if appt has been made for pt for injection.

## 2018-02-17 ENCOUNTER — Other Ambulatory Visit: Payer: Self-pay | Admitting: Emergency Medicine

## 2018-02-20 MED ORDER — MEPOLIZUMAB 100 MG ~~LOC~~ SOLR
100.0000 mg | SUBCUTANEOUS | Status: DC
  Administered 2018-02-12: 100 mg via SUBCUTANEOUS

## 2018-02-20 NOTE — Progress Notes (Signed)
Documentation of medication administration and charges of Nucala have been completed by Maricruz Lucero, CMA based on the Nucala documentation sheet completed by Tammy Scott.   

## 2018-02-24 ENCOUNTER — Ambulatory Visit: Payer: 59 | Admitting: Allergy & Immunology

## 2018-02-24 ENCOUNTER — Encounter: Payer: Self-pay | Admitting: Allergy & Immunology

## 2018-02-24 VITALS — BP 132/76 | HR 90 | Temp 98.2°F | Resp 17 | Ht 70.87 in | Wt 237.8 lb

## 2018-02-24 DIAGNOSIS — J4540 Moderate persistent asthma, uncomplicated: Secondary | ICD-10-CM

## 2018-02-24 DIAGNOSIS — D721 Eosinophilia, unspecified: Secondary | ICD-10-CM

## 2018-02-24 DIAGNOSIS — J310 Chronic rhinitis: Secondary | ICD-10-CM

## 2018-02-24 NOTE — Patient Instructions (Addendum)
1. Moderate persistent asthma, uncomplicated - Lung testing looks great today. - Continue with all of the medications that Pulmonology has placed you on.   2. Chronic rhinitis - We could not do skin testing since you took an antihistamine recently. - We will see you in one week for skin testing. - Stop the Claritin after tomorrow so that it is fully out your system.   3. Return in about 1 week (around 03/03/2018) for SKIN TESTING.   Please inform us of any Emergency Department visits, hospitalizations, or changes in symptoms. Call us before going to the ED for breathing or allergy symptoms since we might be able to fit you in for a sick visit. Feel free to contact us anytime with any questions, problems, or concerns.  It was a pleasure to meet you today!  Websites that have reliable patient information: 1. American Academy of Asthma, Allergy, and Immunology: www.aaaai.org 2. Food Allergy Research and Education (FARE): foodallergy.org 3. Mothers of Asthmatics: http://www.asthmacommunitynetwork.org 4. American College of Allergy, Asthma, and Immunology: MissingWeapons.cawww.acaai.org   Make sure you are registered to vote! If you have moved or changed any of your contact information, you will need to get this updated before voting!

## 2018-02-24 NOTE — Progress Notes (Signed)
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 male presenting with acute onset of allergic and asthma symptoms in April/May 2019. Prior to this, he had absolutely no signs of atopy aside from contact dermatitis. His FeNO certainly responded well to appropriate antiinflammatory therapies and he reponds well to albuterol. He did have an environmental specific IgE panel that was low positive to only two items, but his IgE was very elevated. I am not convinced that we can attribute his eosinophilia only to atopy with the findings on the environmental allergy blood work. It certainly responds well to asthma medications, but I am not convinced that this is allergy driven. Instead, this could be a manifestation of an eosinophilic disease. Thankfully, at this point, the only organ seemingly affected are the lungs.   ANCA panel was negative, pointing against something like Jose Ross or Jose Ross. With the elevated eosinophil count, I believe we need to rule out hypereosinophilic syndrome as well as mastocytosis. Drugs can cause elevated eosinophils, however he was not on any drugs at the time that this all started. Certain immunodeficiencies - namely DOCK8, IPEX, and ALPS - present with eosinophilia, however he has had no infections whatsoever. We will get a vitamin B12 and a FIP1L1 and PDGFRA Fusion FISH assay to evaluate for HES. Typically these patients are much more sick and display mor end organ damage, but this might be an early phase. Reassuringly, he has no cell lines down on his CBC. We will also get a serum tryptase to rule out mastocytosis. I believe we should do a workup for parasites, therefore we will obtain serologies to rule out helminths including Strongyloides and  Toxoplasmosis, amongst others. Also reassuring is his fairly normal chest CT findings. We will have a very low threshold for a heme/onc referral given the elevated AEC.    Plan/Recommendations:   1. Moderate persistent asthma, uncomplicated - Lung testing looks great today. - Continue with all of the medications that Pulmonology has placed you on.   2. Chronic rhinitis - We could not do skin testing since you took an antihistamine recently. - We will see you in one week for skin testing. - Stop the Claritin after tomorrow so that it is fully out your system.   3. Return in about 1 week (around 03/03/2018) for SKIN TESTING.   Subjective:   Jose Ross is a 48 y.o. male presenting today for evaluation of  Chief Complaint  Patient presents with  . Asthma    Jose Ross has a history of the following: Patient Active Problem List   Diagnosis Date Noted  . Allergic rhinitis 01/06/2018  . Asthma 12/04/2017  . Dyspnea 12/02/2017  . Cough 12/02/2017    History obtained from: chart review and patient.  Jose Ross was referred by Patient, No Pcp Per.     Jose Ross is a 47 y.o. male presenting for an evaluation of allergies and asthma. Jose Ross first started having problems with shortness of breath in April 2019. AT that time, he presented to the ED with wheezing. He has no history of asthma and little PMHx aside from contact dermatitis. He did smoke years ago (15 pk-yrs total), but otherwise has had no concerning exposures, including birds/chickens. He stopped smoking 20 years ago. Symptoms started  with a mild cough and evolved into DOE. He has had multiple prednisone courses since these symptoms started.   During this time, has has noticed a lot of clear nasal drainage. He also has GERD symptoms multiple times per week and is on a PPI (omeprazole 57m daily, started in May 2019). He was first worked up by Jose Ross He had a FeNO that was 152 in May 2019, which improved  to 17 upon initiation of Dulera and other medications from his first Pulmonology appointment. It did jump back up to the mid 50s in June 2019.   CXR in March 2019 was consistent with "bronchitis changes" per the Pulmonology note. He had a chest CT that demonstrated some non-specific pulmonary nodules but was otherwise unremarkable. The radiologist read did not seem concerned at all. There was no evidence of interstitial lung disease.   He was diagnosed with asthma two months ago. He has been seeing Jose Ross His first flare was May 25th. Dulera 200/5 started May 2019.   He had an IgE level of 588 (May 2019) and an ABronx(May 2019), dropping to 300 with prednisone and then increasing again to 1400 most recently. He started [place] 02/12/18. He started to feel better over the last 4-5 days since the Jose Ross was started. He had negative ANCAs. He did have blood environmental allergy testing that demonstrated positive to Aspergillus (very low) and cedar (very low).   He works at [employer] This is not a dusty environment at all. 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 He moved here for [place] He denies any environmental changes. He has lived here for nearly two years but did not have problems in [place] He has no animals at home and no birds specifically. Exposure history is largely unrevealing.   Regarding allergic rhinitis symptoms, [facility] for allergies in the past aside from a recent blood test. 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 He has been on antihistamines during this time as well as Flonase. Singulair was added in May 2019.   There is a history of asthma on her mother's side, but it is not severe from what Jose Ross tell me today. He is an only child.   Otherwise, there is no history of other atopic diseases, including drug allergies, food allergies, stinging insect allergies,  or urticaria. There is no significant infectious history. Vaccinations are up to date.    Past Medical History: Patient Active Problem List   Diagnosis Date Noted  . Allergic rhinitis 01/06/2018  . Asthma 12/04/2017  . Dyspnea 12/02/2017  . Cough 12/02/2017    Medication List:  Allergies as of 02/24/2018   No Known Allergies     Medication List        Accurate as of 02/24/18 11:59 PM. Always use your most recent med list.          albuterol 108 (90 Base) MCG/ACT inhaler Commonly known as:  PROVENTIL HFA;VENTOLIN HFA Inhale 2 puffs into the lungs every 4 (four) hours as needed for wheezing or shortness of breath (cough, shortness of breath or wheezing.).   albuterol (2.5 MG/3ML) 0.083% nebulizer solution Commonly known as:  PROVENTIL Take 3 mLs (2.5 mg total) by nebulization every 4 (four) hours as needed for wheezing or shortness of breath.   benzonatate 200 MG capsule Commonly known as:  TESSALON TAKE ONE CAPSULE BY MOUTH  THREE TIMES DAILY AS NEEDED FOR COUGH   EPINEPHrine 0.3 mg/0.3 mL Soaj injection Commonly known as:  EPI-PEN Inject one dose intramuscularly for allergic reaction. May repeat one dose if needed after 5-15 minutes. Proceed to the ER   mometasone-formoterol 200-5 MCG/ACT Aero Commonly known as:  DULERA Inhale 2 puffs into the lungs 2 (two) times daily.   montelukast 10 MG tablet Commonly known as:  SINGULAIR Take 1 tablet (10 mg total) by mouth at bedtime.   predniSONE 10 MG tablet Commonly known as:  DELTASONE Take 84m daily  for 1 week, then take 334mfor one week , then 2014maily until next ov.       Birth History: non-contributory.  Developmental History: non-contributory.   Past Surgical History: History reviewed. No pertinent surgical history.   Family History: Family History  Problem Relation Age of Onset  . Asthma Mother      Social History: [place] at home with his girlfriend. 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 There are no animals in the home. [place] moved here from [place] the last few years.     Review of Systems: a 14-point review of systems is pertinent for what is mentioned in HPI.  Otherwise, all other systems were negative. Constitutional: negative other than that listed in the HPI Eyes: negative other than that listed in the HPI Ears, nose, mouth, throat, and face: negative other than that listed in the HPI Respiratory: negative other than that listed in the HPI Cardiovascular: negative other than that listed in the HPI Gastrointestinal: negative other than that listed in the HPI Genitourinary: negative other than that listed in the HPI Integument: negative other than that listed in the HPI Hematologic: negative other than that listed in the HPI Musculoskeletal: negative other than that listed in the HPI Neurological: negative other than that listed in the HPI Allergy/Immunologic: negative other than that listed in the HPI    Objective:   Blood pressure 132/76, pulse 90, temperature 98.2 F (36.8 C), resp. rate 17, height 5' 10.87" (1.8 m), weight 237 lb 12.8 oz (107.9 kg), SpO2 94 %. Body mass index is 33.29 kg/m.   Physical Exam:  General: Alert, interactive, in no acute distress. Pleasant and talkative. Does not appear short of breath.  Eyes: No conjunctival injection bilaterally, no discharge on the right, no discharge on the left and no Horner-Trantas dots present. PERRL bilaterally. EOMI without pain. No photophobia.  Ears: Right TM pearly gray with normal light reflex, Left TM pearly gray with normal light reflex, Right TM intact without perforation and Left TM intact without perforation.  Nose/Throat: External nose within normal limits and septum midline. Turbinates edematous without discharge. Posterior oropharynx mildly erythematous without cobblestoning in the posterior oropharynx. Tonsils 2+ without exudates.  Tongue without  thrush. Neck: Supple without thyromegaly. Trachea midline. Adenopathy: no enlarged lymph nodes appreciated in the anterior cervical, occipital, axillary, epitrochlear, inguinal, or popliteal regions. Lungs: Clear to auscultation without wheezing, rhonchi or rales. No increased work of breathing. CV: Normal S1/S2. No murmurs. Capillary refill <2 seconds.  Abdomen: Nondistended, nontender. No guarding or rebound tenderness. Bowel sounds present in all fields and hypoactive  Skin: Warm and dry, without lesions or rashes. Extremities:  No clubbing, cyanosis or edema. Neuro:   Grossly intact. No focal deficits appreciated. Responsive to questions.  Diagnostic studies:   Spirometry: results normal (FEV1: 3.17/86%, FVC: 4.27/86%, FEV1/FVC: 74%).   Spirometry consistent with  normal pattern.  Allergy Studies: deferred due to recent antihistamines      Salvatore Marvel, MD [facility]

## 2018-02-27 ENCOUNTER — Encounter: Payer: Self-pay | Admitting: Allergy & Immunology

## 2018-02-27 ENCOUNTER — Telehealth: Payer: Self-pay

## 2018-02-27 MED ORDER — PREDNISONE 20 MG PO TABS: 20.0000 mg | ORAL_TABLET | Freq: Two times a day (BID) | ORAL | 0 refills | Status: AC

## 2018-02-27 NOTE — Telephone Encounter (Signed)
[employer] called regarding patients workers comp paperwork their company faxed over.  [phone] ext [phone]

## 2018-02-27 NOTE — Progress Notes (Signed)
Done

## 2018-02-27 NOTE — Addendum Note (Signed)
Addended by: Alfonse SpruceGALLAGHER, Sarissa Dern LOUIS on: 02/27/2018 08:52 AM   Modules accepted: Orders

## 2018-02-27 NOTE — Progress Notes (Signed)
I called Mr. Jose Ross to let him know that we are going to refer him to see Hematology/Oncology. I discussed other etiologies of hypereosinophilia with Mr. Jose Ross. He denies travel aside from [place] last year. He also denies any GI symptoms. However, he is reporting rampant itching since stopping his antihistamines (this is new for him - again prior to April 2019 he was asymptomatic from an allergy perspective aside from dermatographism). I explained the additional blood work, which he was completely fine with getting. We will give him the lab requisitions when he sees us on Tuesday. He was also fine with the referral to Heme/Onc.   Jose BondsJoel Keidy Thurgood, MD [facility]

## 2018-03-02 NOTE — Telephone Encounter (Signed)
I called and spoke with Waldo LaineKaitlin from [employer], I had her refax the paperwork so that I can relay the documents to Dr. Dellis AnesGallagher.

## 2018-03-03 ENCOUNTER — Encounter: Payer: Self-pay | Admitting: Allergy & Immunology

## 2018-03-03 ENCOUNTER — Ambulatory Visit (INDEPENDENT_AMBULATORY_CARE_PROVIDER_SITE_OTHER): Payer: 59 | Admitting: Allergy & Immunology

## 2018-03-03 VITALS — BP 130/82 | HR 87 | Temp 98.7°F | Resp 20 | Ht 70.43 in | Wt 242.0 lb

## 2018-03-03 DIAGNOSIS — J310 Chronic rhinitis: Secondary | ICD-10-CM | POA: Diagnosis not present

## 2018-03-03 DIAGNOSIS — D721 Eosinophilia, unspecified: Secondary | ICD-10-CM | POA: Insufficient documentation

## 2018-03-03 DIAGNOSIS — J4550 Severe persistent asthma, uncomplicated: Secondary | ICD-10-CM

## 2018-03-03 MED ORDER — MONTELUKAST SODIUM 10 MG PO TABS: 10.0000 mg | ORAL_TABLET | Freq: Every day | ORAL | 5 refills | Status: DC

## 2018-03-03 MED ORDER — TIOTROPIUM BROMIDE MONOHYDRATE 1.25 MCG/ACT IN AERS: 2.0000 | INHALATION_SPRAY | Freq: Once | RESPIRATORY_TRACT | 5 refills | Status: DC

## 2018-03-03 MED ORDER — MOMETASONE FURO-FORMOTEROL FUM 100-5 MCG/ACT IN AERO: 2.0000 | INHALATION_SPRAY | Freq: Two times a day (BID) | RESPIRATORY_TRACT | 5 refills | Status: DC

## 2018-03-03 NOTE — Patient Instructions (Addendum)
1. Moderate persistent asthma, uncomplicated - Lung testing looks much worse today. - We will add on Spiriva two puffs once daily (this will help open up your lungs using a different receptor than the long acting albuterol in [facility]).  - Daily controller medication(s): Nucala 100mg  monthly, Singulair 1.25mg  daily, Dulera 200/525mcg two puffs twice daily with spacer and Spiriva 1.4225mcg two puffs once daily - Prior to physical activity: ProAir 2 puffs 10-15 minutes before physical activity. - Rescue medications: ProAir 4 puffs every 4-6 hours as needed or albuterol nebulizer one vial every 4-6 hours as needed - Asthma control goals:  * Full participation in all desired activities (may need albuterol before activity) * Albuterol use two time or less a week on average (not counting use with activity) * Cough interfering with sleep two time or less a month * Oral steroids no more than once a year * No hospitalizations  2. Non-allergic rhinitis - Testing today was negative to the entire panel. - As I suspected, I do not think that this is allergy mediated. - We will call you back when we get these labs back to rule out other causes of elevated eosinophilis.  - I will send a message to the Hematology/Oncology physician so that he is aware of your history.   3. Itching with dermatographism - Start suppressive dosing of antihistamines:   - Morning: Allegra (fexofenadine) 360mg  (two tablets)  - Evening: Zyrtec (cetirizine) 20mg  (two tablets) + Singulair (montelukast) 10mg   - If the above is not working, try adding: Zantac (ranitidine) 300mg  (two tablets) - You can change this dosing at home, decreasing the dose as needed or increasing the dosing as needed.   4. Return in about 3 months (around 06/03/2018). But we will be in contact before that, I'm sure.    Please inform us of any Emergency Department visits, hospitalizations, or changes in symptoms. Call us before going to the ED for breathing or  allergy symptoms since we might be able to fit you in for a sick visit. Feel free to contact us anytime with any questions, problems, or concerns.  It was a pleasure to meet you today!  Websites that have reliable patient information: 1. American Academy of Asthma, Allergy, and Immunology: www.aaaai.org 2. Food Allergy Research and Education (FARE): foodallergy.org 3. Mothers of Asthmatics: http://www.asthmacommunitynetwork.org 4. American College of Allergy, Asthma, and Immunology: MissingWeapons.cawww.acaai.org   Make sure you are registered to vote! If you have moved or changed any of your contact information, you will need to get this updated before voting!

## 2018-03-03 NOTE — Progress Notes (Addendum)
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 As I suspected at the last visit, I do not think that his disease stat has any allergic trigger, as it would have been odd for such severe allergies to emerge acutely at this stage. We are going to get a host of labs to rule out serious causes of eosinophilia, namely parasitic infections as well as neoplastic processes and mastocytosis. Thankfully, he has an appointment with Hematology/Oncology this coming (Dr. Derek Jack), who can do a more thorough workup for eosinophilia. We are still unsure of the cause of his eosinophilia, but we are slowly ruling out possible etiologies. He would like to get off of prednisone and remain off of prednisone, and with the Nucala we Thursday are working on doing just that. We may consider changing to [employer] or Dupixent if he continues to have intense SOB with the use of the Nucala (he is not due for another dose for one week and his spirometry looks fairly bad today). There is certainly some aspect of asthma, and he does respond very well to the albuterol treatments. But this does not seem like run of the mill asthma.    Total of 30 minutes, greater than 50% of which was spent in discussion of treatment and management options.    Plan/Recommendations:   1. Moderate persistent asthma, uncomplicated - Lung testing looks much worse today. - We will add on Spiriva two puffs once daily (this will help open up your lungs using a different receptor than the long acting albuterol in [facility]).  - Daily controller medication(s): Nucala '100mg'$  monthly, Singulair 1.'25mg'$   daily, Dulera 200/64mg two puffs twice daily with spacer and Spiriva 1.262m two puffs once daily - Prior to physical activity: ProAir 2 puffs 10-15 minutes before physical activity. - Rescue medications: ProAir 4 puffs every 4-6 hours as needed or albuterol nebulizer one vial every 4-6 hours as needed - Asthma control goals:  * Full participation in all desired activities (may need albuterol before activity) * Albuterol use two time or less a week on average (not counting use with activity) * Cough interfering with sleep two time or less a month * Oral steroids no more than once a year * No hospitalizations  2. Non-allergic rhinitis - Testing today was negative to the entire panel. - As I suspected, I do not think that this is allergy mediated. - We will call you back when we get these labs back to rule out other causes of elevated eosinophilis.  - I will send a message to the Hematology/Oncology physician so that he is aware of your history.   3. Itching with dermatographism - Start suppressive dosing of antihistamines:   - Morning: Allegra (fexofenadine) '360mg'$  (two tablets)  - Evening: Zyrtec (cetirizine) '20mg'$  (two tablets) + Singulair (montelukast) '10mg'$   - If the above is not working, try adding: Zantac (ranitidine) '300mg'$  (two tablets) - You can change this dosing at home, decreasing the dose as needed or increasing the dosing as needed.   4. Eosinophilia - We are obtaining labs to look for parasitic infections today, although he is low risk for this.  - We are obtaining an assay to evaluate for FIP1L1 and PDGFRA Fusion, which is  present in some hypereosinophilic syndrome cases. - We are going to get a serum tryptase to rule out mastocytosis.  - We did have discussion about the causes of elevated eosinophils and I reassured Jose Ross and his girlfriend that we are looking into serious causes of eosinophilia and ruling things out one by one. - I am cautiously optimistic that we can  figure out the cause of his eosinophilia and address the underlying cause.  - I will call Jose Ross as soon as I get some results back. - In the interim, I think we should continue with his current medication regimen, although he might need a different drug such as Fasenra or Dupixent since he is having marked breakthrough symptoms even with the Nucala on board.   5. Return in about 3 months (around 06/03/2018).   Subjective:   Jose Ross is a 47 y.o. male presenting today for follow up of  Chief Complaint  Patient presents with  . Allergy Testing    asthma     Jose Ross has a history of the following: Patient Active Problem List   Diagnosis Date Noted  . Chronic rhinitis 03/03/2018  . Eosinophilia 03/03/2018  . Allergic rhinitis 01/06/2018  . Asthma 12/04/2017  . Dyspnea 12/02/2017  . Cough 12/02/2017    History obtained from: chart review and patient.  Jose Ross Primary Care Provider is Patient, No Pcp Per.     Jose Ross is a 47 y.o. male presenting for a skin testing. Briefly, Jose Ross is a delightful 47 y.o. male whom I first saw one week ago. At that time, he presented for an allergy evaluation after having acutely presented with asthma symptoms in May 2019 after having had no atopy at all for his entire life. He was already on [place] for his breathing, and had been on prednisone nearly continuously for over three months. He also has a history of marked eosinophilia, which I felt needed more of a workup and was higher than that which would be expected from uncontrolled atopy alone.    Since the last visit, he has remained fairly stable but continues to have marked problems with shortness of breath even with normal ADLs, including just climbing up the stairs at his home. 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 He is going to see Dr. Lamonte Sakai next week and has an appointment with  Heme/Onc on Thursday.   Jose Ross also reports that he is having episodes of angioedema in conjunction with his rash and pruritis. See pictures below. These have been controlled with antihistamines, but they have flared over the weekend since he has been off of his antihistamines.   Otherwise, there have been no changes to his past medical history, surgical history, family history, or social history.          Review of Systems: a 14-point review of systems is pertinent for what is mentioned in HPI.  Otherwise, all other systems were negative. Constitutional: negative other than that listed in the HPI Eyes: negative other than that listed in the HPI Ears, nose, mouth, throat, and face: negative other than that listed in the HPI Respiratory: negative other than that listed in the HPI Cardiovascular: negative other than that listed in the HPI Gastrointestinal: negative other than that listed in the HPI Genitourinary: negative other than that listed in the HPI Integument: negative other than that listed in the HPI Hematologic: negative other  than that listed in the HPI Musculoskeletal: negative other than that listed in the HPI Neurological: negative other than that listed in the HPI Allergy/Immunologic: negative other than that listed in the HPI    Objective:   Blood pressure 130/82, pulse 87, temperature 98.7 F (37.1 C), temperature source Oral, resp. rate 20, height 5' 10.43" (1.789 m), weight 242 lb (109.8 kg), SpO2 97 %. Body mass index is 34.3 kg/m.   Physical Exam: deferred since this was a skin testing appointment only   Diagnostic studies:   Spirometry: results normal (FEV1: 2.04/55%, FVC: 2.99/60%, FEV1/FVC: 68%).    Spirometry consistent with mixed obstructive and restrictive disease. Overall this is markedly worse than when I saw him last week. He did take several puffs of albuterol when he was in our clinic.   Allergy Studies:   Indoor/Outdoor Percutaneous  Adult Environmental Panel: negative to the entire panel with adequate controls.   Allergy testing results were read and interpreted by myself, documented by clinical staff.      Salvatore Marvel, MD  [facility]

## 2018-03-03 NOTE — Addendum Note (Signed)
Addended by: Alfonse SpruceGALLAGHER, Merridy Pascoe [place] on: 03/03/2018 08:10 AM   Modules accepted: Orders

## 2018-03-04 ENCOUNTER — Encounter: Payer: Self-pay | Admitting: Allergy & Immunology

## 2018-03-04 NOTE — Progress Notes (Signed)
Labs have been added and Lorayne MarekRochelle is aware.

## 2018-03-04 NOTE — Telephone Encounter (Signed)
Paperwork has been signed by Dr. Dellis AnesGallagher and faxed.

## 2018-03-04 NOTE — Telephone Encounter (Signed)
-----   Message from Alfonse SpruceJoel Louis Gallagher, MD sent at 03/04/2018  6:34 AM EDT ----- Can someone ask Lorayne MarekRochelle to add on an LDH to Mr. Buczynski's labs from yesterday? They were drawn in [facility]. Thanks!

## 2018-03-05 ENCOUNTER — Other Ambulatory Visit: Payer: Self-pay

## 2018-03-05 ENCOUNTER — Inpatient Hospital Stay (HOSPITAL_COMMUNITY): Payer: 59 | Attending: Hematology | Admitting: Hematology

## 2018-03-05 ENCOUNTER — Inpatient Hospital Stay (HOSPITAL_COMMUNITY): Payer: 59

## 2018-03-05 ENCOUNTER — Encounter (HOSPITAL_COMMUNITY): Payer: Self-pay | Admitting: Hematology

## 2018-03-05 VITALS — BP 125/86 | HR 102 | Temp 97.7°F | Resp 20 | Wt 241.1 lb

## 2018-03-05 DIAGNOSIS — D721 Eosinophilia, unspecified: Secondary | ICD-10-CM

## 2018-03-05 DIAGNOSIS — Z808 Family history of malignant neoplasm of other organs or systems: Secondary | ICD-10-CM

## 2018-03-05 DIAGNOSIS — Z87891 Personal history of nicotine dependence: Secondary | ICD-10-CM

## 2018-03-05 LAB — COMPREHENSIVE METABOLIC PANEL
ALBUMIN: 3.7 g/dL (ref 3.5–5.0)
ALK PHOS: 94 U/L (ref 38–126)
ALT: 24 U/L (ref 0–44)
AST: 15 U/L (ref 15–41)
Anion gap: 7 (ref 5–15)
BILIRUBIN TOTAL: 0.7 mg/dL (ref 0.3–1.2)
BUN: 24 mg/dL — AB (ref 6–20)
CALCIUM: 9 mg/dL (ref 8.9–10.3)
CO2: 31 mmol/L (ref 22–32)
CREATININE: 1.28 mg/dL — AB (ref 0.61–1.24)
Chloride: 102 mmol/L (ref 98–111)
GFR calc Af Amer: >60 mL/min (ref >60)
GLUCOSE: 95 mg/dL (ref 70–99)
POTASSIUM: 4.9 mmol/L (ref 3.5–5.1)
Sodium: 140 mmol/L (ref 135–145)
TOTAL PROTEIN: 6.6 g/dL (ref 6.5–8.1)

## 2018-03-05 LAB — URINALYSIS
BILIRUBIN UA: NEGATIVE
Glucose, UA: NEGATIVE
KETONES UA: NEGATIVE
Leukocytes, UA: NEGATIVE
NITRITE UA: NEGATIVE
Protein, UA: NEGATIVE
RBC UA: NEGATIVE
Specific Gravity, UA: 1.022 (ref 1.005–1.030)
UUROB: 0.2 mg/dL (ref 0.2–1.0)
pH, UA: 6 (ref 5.0–7.5)

## 2018-03-05 LAB — CBC WITH DIFFERENTIAL/PLATELET
BASOS ABS: 0.1 10*3/uL (ref 0.0–0.1)
Basophils Relative: 1 %
Eosinophils Absolute: 0.2 10*3/uL (ref 0.0–0.7)
Eosinophils Relative: 2 %
HEMATOCRIT: 49.4 % (ref 39.0–52.0)
HEMOGLOBIN: 16.3 g/dL (ref 13.0–17.0)
LYMPHS PCT: 28 %
Lymphs Abs: 3 10*3/uL (ref 0.7–4.0)
MCH: 32 pg (ref 26.0–34.0)
MCHC: 33 g/dL (ref 30.0–36.0)
MCV: 96.9 fL (ref 78.0–100.0)
MONO ABS: 1.1 10*3/uL — AB (ref 0.1–1.0)
Monocytes Relative: 10 %
NEUTROS ABS: 6.4 10*3/uL (ref 1.7–7.7)
NEUTROS PCT: 59 %
Platelets: 168 10*3/uL (ref 150–400)
RBC: 5.1 MIL/uL (ref 4.22–5.81)
RDW: 13.4 % (ref 11.5–15.5)
WBC: 10.7 10*3/uL — ABNORMAL HIGH (ref 4.0–10.5)

## 2018-03-05 LAB — SAVE SMEAR

## 2018-03-05 LAB — CK: Total CK: 30 U/L — ABNORMAL LOW (ref 49–397)

## 2018-03-05 LAB — VITAMIN B12: Vitamin B-12: 560 pg/mL (ref 232–1245)

## 2018-03-05 LAB — STRONGYLOIDES, AB, IGG: STRONGYLOIDES, AB, IGG: NEGATIVE

## 2018-03-05 LAB — HIV-1 RNA QUANT-NO REFLEX-BLD: HIV-1 RNA Viral Load: <20 copies/mL

## 2018-03-05 LAB — LACTATE DEHYDROGENASE: LDH: 176 U/L (ref 98–192)

## 2018-03-05 LAB — TROPONIN I

## 2018-03-05 NOTE — Progress Notes (Signed)
CONSULT NOTE  Patient Care Team: Patient, No Pcp Per as PCP - General (General Practice)  CHIEF COMPLAINTS/PURPOSE OF CONSULTATION:  Eosinophilia.  HISTORY OF PRESENTING ILLNESS:  Jose Ross 47 y.o. 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  He was well until April/May 2019 when he started having shortness of breath and was diagnosed with allergic asthma and started on prednisone taper.  His breathing has improved with prednisone.  His white count on 12/04/2017 was elevated at 15.8 with 36% eosinophils and absolute eosinophil count of 5800.  On 12/12/2017 his absolute eosinophil count was 300.  On a CBC on 01/06/2018 his absolute eosinophil count was 1400.  Immunoglobulin IgG level was initially elevated at 850 on 12/04/2017 and 588 on 01/06/2018.  He was started on Nucala with first dose on 02/08/2018.  His prednisone was tapered off yesterday.  He has gained about 20 pounds since the start of prednisone.  He denies any fevers or night sweats or weight loss prior to prednisone.  Denies any bowel changes including diarrhea.  He was evaluated by Dr. Ernst Bowler for various allergies.  He works at [employer] which is not a dusty environment.  No exposure to chemicals.  He moved from [place] about 2 to 3 years ago.  Denies any travel outside the country.  No pets at home.  His mother reportedly had asthma in her 54s which resolved.  His daughter does not have any respiratory problems.  Recently when his over-the-counter antihistamines were stopped he developed pruritic rash all over his body.  The rash improved after he was started back on antihistamines. Dr. Ernst Bowler has sent assays for FIP1L1 and PDGFRA fusion.  Strongyloidiasis antibody was negative.  LDH was in the upper limit of normal.  MEDICAL HISTORY:  History reviewed. No pertinent past medical history.  SURGICAL HISTORY: History reviewed. No pertinent surgical history.  SOCIAL  HISTORY: Social History   Socioeconomic History  . Marital status: Single    Spouse name: Not on file  . Number of children: 2  . Years of education: Not on file  . Highest education level: Not on file  Occupational History  . Not on file  Social Needs  . Financial resource strain: Not hard at all  . Food insecurity:    Worry: Never true    Inability: Never true  . Transportation needs:    Medical: No    Non-medical: No  Tobacco Use  . Smoking status: Former Smoker    Packs/day: 0.25    Years: 10.00    Pack years: 2.50    Types: Cigarettes    Last attempt to quit: 2000    Years since quitting: 19.6  . Smokeless tobacco: Never Used  Substance and Sexual Activity  . Alcohol use: Yes    Comment: 4-5 drinks a week  . Drug use: Not Currently  . Sexual activity: Not on file  Lifestyle  . Physical activity:    Days per week: 3 days    Minutes per session: 30 min  . Stress: Only a little  Relationships  . Social connections:    Talks on phone: More than three times a week    Gets together: Three times a week    Attends religious service: Never    Active member of club or organization: No    Attends meetings of clubs or organizations: Never    Relationship status: Divorced  . Intimate partner violence:  Fear of current or ex partner: No    Emotionally abused: No    Physically abused: No    Forced sexual activity: No  Other Topics Concern  . Not on file  Social History Narrative  . Not on file    FAMILY HISTORY: Family History  Problem Relation Age of Onset  . Asthma Mother   . Skin cancer Mother     ALLERGIES:  has No Known Allergies.  MEDICATIONS:  Current Outpatient Medications  Medication Sig Dispense Refill  . albuterol (PROVENTIL HFA;VENTOLIN HFA) 108 (90 Base) MCG/ACT inhaler Inhale 2 puffs into the lungs every 4 (four) hours as needed for wheezing or shortness of breath (cough, shortness of breath or wheezing.). 1 Inhaler 5  . albuterol  (PROVENTIL) (2.5 MG/3ML) 0.083% nebulizer solution Take 3 mLs (2.5 mg total) by nebulization every 4 (four) hours as needed for wheezing or shortness of breath. 150 mL 5  . mometasone-formoterol (DULERA) 100-5 MCG/ACT AERO Inhale 2 puffs into the lungs 2 (two) times daily. 1 Inhaler 5  . montelukast (SINGULAIR) 10 MG tablet Take 1 tablet (10 mg total) by mouth at bedtime. 30 tablet 5  . benzonatate (TESSALON) 200 MG capsule TAKE ONE CAPSULE BY MOUTH THREE TIMES DAILY AS NEEDED FOR COUGH (Patient not taking: Reported on 03/05/2018) 45 capsule 1  . EPINEPHrine 0.3 mg/0.3 mL IJ SOAJ injection Inject one dose intramuscularly for allergic reaction. May repeat one dose if needed after 5-15 minutes. Proceed to the ER  11   Current Facility-Administered Medications  Medication Dose Route Frequency Provider Last Rate Last Dose  . Mepolizumab SOLR 100 mg  100 mg Subcutaneous Q28 days Collene Gobble, MD   100 mg at 02/12/18 1751    REVIEW OF SYSTEMS:   Constitutional: Denies fevers, chills or abnormal night sweats Eyes: Denies blurriness of vision, double vision or watery eyes Ears, nose, mouth, throat, and face: Denies mucositis or sore throat Respiratory: Shortness of breath on exertion present.  Cough spells 2-3 times. Cardiovascular: Denies palpitation, chest discomfort or lower extremity swelling Gastrointestinal:  Denies nausea, heartburn or change in bowel habits Skin: Denies abnormal skin rashes Lymphatics: Denies new lymphadenopathy or easy bruising Neurological:Denies numbness, tingling or new weaknesses Behavioral/Psych: Mood is stable, no new changes  All other systems were reviewed with the patient and are negative.  PHYSICAL EXAMINATION: ECOG PERFORMANCE STATUS: 0 - Asymptomatic  Vitals:   03/05/18 1323  BP: 125/86  Pulse: (!) 102  Resp: 20  Temp: 97.7 F (36.5 C)  SpO2: 100%   Filed Weights   03/05/18 1323  Weight: 241 lb 1.6 oz (109.4 kg)    GENERAL:alert, no distress  and comfortable SKIN: skin color, texture, turgor are normal, no rashes or significant lesions EYES: normal, conjunctiva are pink and non-injected, sclera clear OROPHARYNX:no exudate, no erythema and lips, buccal mucosa, and tongue normal  NECK: supple, thyroid normal size, non-tender, without nodularity LYMPH:  no palpable lymphadenopathy in the cervical, axillary or inguinal LUNGS: clear to auscultation and percussion with normal breathing effort HEART: regular rate & rhythm and no murmurs and no lower extremity edema ABDOMEN:abdomen soft, non-tender and normal bowel sounds Musculoskeletal:no cyanosis of digits and no clubbing  PSYCH: alert & oriented x 3 with fluent speech NEURO: no focal motor/sensory deficits  LABORATORY DATA:  I have reviewed the data as listed Recent Results (from the past 2160 hour(s))  Nitric oxide     Status: None   Collection Time: 12/12/17  4:12 PM  Result Value Ref Range   Nitric Oxide 17   Urinalysis, Routine w reflex microscopic     Status: None   Collection Time: 12/12/17  5:25 PM  Result Value Ref Range   Color, Urine YELLOW Yellow;Lt. Yellow   APPearance CLEAR Clear   Specific Gravity, Urine 1.025 1.000 - 1.030   pH 6.0 5.0 - 8.0   Total Protein, Urine NEGATIVE Negative   Urine Glucose NEGATIVE Negative   Ketones, ur NEGATIVE Negative   Bilirubin Urine NEGATIVE Negative   Hgb urine dipstick NEGATIVE Negative   Urobilinogen, UA 0.2 0.0 - 1.0   Leukocytes, UA NEGATIVE Negative   Nitrite NEGATIVE Negative   WBC, UA 0-2/hpf 0-2/hpf   RBC / HPF 0-2/hpf 0-2/hpf   Squamous Epithelial / LPF Rare(0-4/hpf) Rare(0-4/hpf)  Sedimentation rate     Status: Abnormal   Collection Time: 12/12/17  5:25 PM  Result Value Ref Range   Sed Rate 20 (H) 0 - 15 mm/hr  ANCA screen with reflex titer     Status: None   Collection Time: 12/12/17  5:25 PM  Result Value Ref Range   ANCA Screen NEGATIVE NEGATIVE    Comment: ANCA Screen includes evaluation for p-ANCA,  c-ANCA and atypical p-ANCA. A positive ANCA screen reflexes to titer and pattern(s), e.g., cytoplasmic pattern (c-ANCA), perinuclear pattern (p-ANCA), or atypical p-ANCA pattern.  c-ANCA and p-ANCA are observed in vasculitis, whereas atypical p-ANCA is observed in IBD (Inflammatory Bowel Disease). Atypical p-ANCA is  detected in about 55% to 80% of patients with ulcerative colitis but only 5% to 25% of patients with Crohn's disease. Marland Kitchen   CBC w/Diff     Status: None   Collection Time: 12/12/17  5:25 PM  Result Value Ref Range   WBC 10.0 4.0 - 10.5 K/uL   RBC 5.25 4.22 - 5.81 Mil/uL   Hemoglobin 16.5 13.0 - 17.0 g/dL   HCT 48.5 39.0 - 52.0 %   MCV 92.3 78.0 - 100.0 fl   MCHC 34.0 30.0 - 36.0 g/dL   RDW 14.0 11.5 - 15.5 %   Platelets 235.0 150.0 - 400.0 K/uL   Neutrophils Relative % 65.8 43.0 - 77.0 %   Lymphocytes Relative 21.9 12.0 - 46.0 %   Monocytes Relative 8.4 3.0 - 12.0 %   Eosinophils Relative 3.4 0.0 - 5.0 %   Basophils Relative 0.5 0.0 - 3.0 %   Neutro Abs 6.6 1.4 - 7.7 K/uL   Lymphs Abs 2.2 0.7 - 4.0 K/uL   Monocytes Absolute 0.8 0.1 - 1.0 K/uL   Eosinophils Absolute 0.3 0.0 - 0.7 K/uL   Basophils Absolute 0.0 0.0 - 0.1 K/uL  Aspergillus IgE Panel     Status: None   Collection Time: 12/12/17  5:25 PM  Result Value Ref Range   Aspergillus fumigatus IgE <0.10 <0.35 kU/L   A. Fumigatus Class Interp 0    Aspergillus [place] IgE <0.10 <0.35 kU/L   A. [place] Class Interp 0    Aspergillus versicolor IgE <0.10 <0.35 kU/L   A. Versicolor Class Interp 0     Comment: The test method is the [employer] ImmunoCAP allergen-specific IgE system. CLASS INTERPRETATION   <0.10 kU/L= 0, Negative; 0.10 - 0.34 kU/L= 0/1, Equivocal/Borderline; 0.35 - 0.69  kU/L=1, Low Positive; 0.70 - 3.49 kU/L=2, Moderate Positive;  3.50  - 17.49 kU/L=3, High Positive; 17.50 - 49.99 kU/L= 4, Very High Positive; 50.00 - 99.99 kU/L= 5, Very High Positive;   >99.99 kU/L=6, Very High Positive  Aspergillus  amstel/glaucu IgE* <0.35 <0.35 kU/L   A. Amstel/Glaucu Class Interp 0    Aspergillus flavus IgE <0.35 <0.35 kU/L   A. Flavus Class Interp 0    Aspergillus nidulans IgE <0.35 <0.35 kU/L   A. Nidulans Class Interp 0    Aspergillus terreus IgE <0.35 <0.35 kU/L   A. Terreus Class Interp 0     Comment: This conventional EIA uses allergen-coated discs from several suppliers and an enzyme-labeled anti-IgE. CLASS INTERPRETATION: <0.35 kU/L=0, Below Detection; 0.35-0.69 kU/L= 1, Low Positive; 0.70-3.49 kU/L= 2, Moderate Positive; 3.50-17.49 kU/L= 3, Positive; 17.50-49.99 kU/L= 4, Strong Positive; 50.00-99.99 kU/L= 5, Very Strong Positive; >99.99 kU/L= 6, Very Strong Positive *This test was developed and its performance characteristics determined by [employer]. It has not been cleared or approved by the U.S. Food and Drug Administration.   Pulmonary function test     Status: None (Preliminary result)   Collection Time: 12/29/17 11:54 AM  Result Value Ref Range   FVC-Pre 4.52 L   FVC-%Pred-Pre 86 %   FVC-Post 4.93 L   FVC-%Pred-Post 94 %   FVC-%Change-Post 9 %   FEV1-Pre 3.34 L   FEV1-%Pred-Pre 81 %   FEV1-Post 3.78 L   FEV1-%Pred-Post 92 %   FEV1-%Change-Post 13 %   FEV6-Pre 4.52 L   FEV6-%Pred-Pre 89 %   FEV6-Post 4.93 L   FEV6-%Pred-Post 97 %   FEV6-%Change-Post 9 %   Pre FEV1/FVC ratio 74 %   FEV1FVC-%Pred-Pre 94 %   Post FEV1/FVC ratio 77 %   FEV1FVC-%Change-Post 3 %   Pre FEV6/FVC Ratio 100 %   FEV6FVC-%Pred-Pre 103 %   Post FEV6/FVC ratio 100 %   FEV6FVC-%Pred-Post 103 %   FEV6FVC-%Change-Post 0 %   FEF 25-75 Pre 2.62 L/sec   FEF2575-%Pred-Pre 71 %   FEF 25-75 Post 3.86 L/sec   FEF2575-%Pred-Post 105 %   FEF2575-%Change-Post 47 %   RV 1.85 L   RV % pred 92 %   TLC 6.39 L   TLC % pred 90 %   DLCO unc 31.40 ml/min/mmHg   DLCO unc % pred 95 %   DLCO cor 29.91 ml/min/mmHg   DLCO cor % pred 90 %   DL/VA 4.99 ml/min/mmHg/L   DL/VA % pred 106 %  CBC w/Diff      Status: Abnormal   Collection Time: 01/06/18 12:25 PM  Result Value Ref Range   WBC 10.6 (H) 4.0 - 10.5 K/uL   RBC 5.04 4.22 - 5.81 Mil/uL   Hemoglobin 16.1 13.0 - 17.0 g/dL   HCT 46.1 39.0 - 52.0 %   MCV 91.4 78.0 - 100.0 fl   MCHC 35.0 30.0 - 36.0 g/dL   RDW 13.8 11.5 - 15.5 %   Platelets 187.0 150.0 - 400.0 K/uL   Neutrophils Relative % 61.0 43.0 - 77.0 %   Lymphocytes Relative 14.9 12.0 - 46.0 %   Monocytes Relative 10.4 3.0 - 12.0 %   Eosinophils Relative 13.2 (H) 0.0 - 5.0 %   Basophils Relative 0.5 0.0 - 3.0 %   Neutro Abs 6.4 1.4 - 7.7 K/uL   Lymphs Abs 1.6 0.7 - 4.0 K/uL   Monocytes Absolute 1.1 (H) 0.1 - 1.0 K/uL   Eosinophils Absolute 1.4 (H) 0.0 - 0.7 K/uL   Basophils Absolute 0.0 0.0 - 0.1 K/uL  IgE     Status: Abnormal   Collection Time: 01/06/18 12:25 PM  Result Value Ref Range   IgE (Immunoglobulin E), Serum 588 (H) <OR=114 kU/L  Strongyloides, Ab, IgG     Status: None   Collection Time: 03/03/18  3:21 PM  Result Value Ref Range   Strongyloides, Ab, IgG Negative Negative  HIV 1 RNA quant-no reflex-bld     Status: None   Collection Time: 03/03/18  3:21 PM  Result Value Ref Range   HIV-1 RNA Viral Load <20 copies/mL    Comment: HIV-1 RNA not detected The reportable range for this assay is 20 to 10,000,000 copies HIV-1 RNA/mL.    HIV-1 RNA Viral Load Log CANCELED log10copy/mL    Comment: Unable to calculate result since non-numeric result obtained for component test.  Result canceled by the ancillary.   B12     Status: None   Collection Time: 03/03/18  3:21 PM  Result Value Ref Range   Vitamin B-12 560 232 - 1,245 pg/mL  Troponin I     Status: None   Collection Time: 03/03/18  3:21 PM  Result Value Ref Range   Troponin I <0.01 0.00 - 0.04 ng/mL  Urinalysis     Status: None   Collection Time: 03/03/18  3:21 PM  Result Value Ref Range   Specific Gravity, UA 1.022 1.005 - 1.030   pH, UA 6.0 5.0 - 7.5   Color, UA Yellow Yellow   Appearance Ur Clear  Clear   Leukocytes, UA Negative Negative   Protein, UA Negative Negative/Trace   Glucose, UA Negative Negative   Ketones, UA Negative Negative   RBC, UA Negative Negative   Bilirubin, UA Negative Negative   Urobilinogen, Ur 0.2 0.2 - 1.0 mg/dL   Nitrite, UA Negative Negative  Lactate dehydrogenase     Status: None   Collection Time: 03/03/18  3:21 PM  Result Value Ref Range   LDH 221 121 - 224 IU/L  Specimen status report     Status: None (Preliminary result)   Collection Time: 03/03/18  3:21 PM  Result Value Ref Range   specimen status report Comment     Comment: Written Authorization Written Authorization   CBC with Differential/Platelet     Status: Abnormal   Collection Time: 03/05/18  3:32 PM  Result Value Ref Range   WBC 10.7 (H) 4.0 - 10.5 K/uL   RBC 5.10 4.22 - 5.81 MIL/uL   Hemoglobin 16.3 13.0 - 17.0 g/dL   HCT 49.4 39.0 - 52.0 %   MCV 96.9 78.0 - 100.0 fL   MCH 32.0 26.0 - 34.0 pg   MCHC 33.0 30.0 - 36.0 g/dL   RDW 13.4 11.5 - 15.5 %   Platelets 168 150 - 400 K/uL   Neutrophils Relative % 59 %   Neutro Abs 6.4 1.7 - 7.7 K/uL   Lymphocytes Relative 28 %   Lymphs Abs 3.0 0.7 - 4.0 K/uL   Monocytes Relative 10 %   Monocytes Absolute 1.1 (H) 0.1 - 1.0 K/uL   Eosinophils Relative 2 %   Eosinophils Absolute 0.2 0.0 - 0.7 K/uL   Basophils Relative 1 %   Basophils Absolute 0.1 0.0 - 0.1 K/uL    Comment: Performed at [facility], [address], [place],  [address]  Comprehensive metabolic panel     Status: Abnormal   Collection Time: 03/05/18  3:32 PM  Result Value Ref Range   Sodium 140 135 - 145 mmol/L   Potassium 4.9 3.5 - 5.1 mmol/L   Chloride 102 98 - 111 mmol/L   CO2 31 22 - 32 mmol/L   Glucose, Bld 95 70 - 99  mg/dL   BUN 24 (H) 6 - 20 mg/dL   Creatinine, Ser 1.28 (H) 0.61 - 1.24 mg/dL   Calcium 9.0 8.9 - 10.3 mg/dL   Total Protein 6.6 6.5 - 8.1 g/dL   Albumin 3.7 3.5 - 5.0 g/dL   AST 15 15 - 41 U/L   ALT 24 0 - 44 U/L   Alkaline Phosphatase 94  38 - 126 U/L   Total Bilirubin 0.7 0.3 - 1.2 mg/dL   GFR calc non Af Amer >60 >60 mL/min   GFR calc Af Amer >60 >60 mL/min    Comment: (NOTE) The eGFR has been calculated using the CKD EPI equation. This calculation has not been validated in all clinical situations. eGFR's persistently <60 mL/min signify possible Chronic Kidney Disease.    Anion gap 7 5 - 15    Comment: Performed at [facility], [address], [place], [place] [address]  Save smear     Status: None   Collection Time: 03/05/18  3:32 PM  Result Value Ref Range   Smear Review SMEAR STAINED AND AVAILABLE FOR REVIEW     Comment: Performed at [facility], [address] [place], [place] [address]  Lactate dehydrogenase     Status: None   Collection Time: 03/05/18  3:32 PM  Result Value Ref Range   LDH 176 98 - 192 U/L    Comment: Performed at [facility], [address], [place], [place] [address]  CK     Status: Abnormal   Collection Time: 03/05/18  3:32 PM  Result Value Ref Range   Total CK 30 (L) 49 - 397 U/L    Comment: Performed at [facility], [address] [place],  [address]    RADIOGRAPHIC STUDIES: I have personally reviewed images of the CT scan of the chest and discussed with patient.  ASSESSMENT & PLAN:  Eosinophilia 1.  Eosinophilia: - Elevated absolute eosinophil count of 5800 recorded on 12/04/2017.  Most recent absolute eosinophil count was 1400 on 01/06/2018.  Etiology includes reactive versus clonal. -Elevated IgE level on 2 occasions of 850 and 588. -Dr. Ernst Bowler has sent assays for FIP1L1 and PDGFRA fusion.  Strongyloidiasis antibody was negative.  LDH was in the upper limit of normal. - I have reviewed his high-resolution CT scan of the chest which did not reveal any pulmonary involvement.  He does not have any gastrointestinal symptoms. - We will order an echocardiogram.  Troponin levels were negative.  Vitamin B12 was normal. I will repeat a CBC with differential today and review his  smear.  We will check tryptase levels.  We will also send his blood for Jak 2 mutation testing and BCR/ABL testing by [facility] panel.  This is to rule out myeloproliferative disorders. - We will also check for FGFR1 and PDGF 4 beta on peripheral blood.  We will follow-up on the infectious disease work-up. -If the absolute eosinophil count remains high, I have recommended bone marrow aspiration biopsy.  We will consider staining for reticulin fibrosis and mast cells.  We will tentatively schedule him for the procedure next week.      All questions were answered. The patient knows to call the clinic with any problems, questions or concerns.     Derek Jack, MD 03/05/18 6:19 PM

## 2018-03-05 NOTE — Progress Notes (Signed)
[facility]

## 2018-03-05 NOTE — Assessment & Plan Note (Signed)
1.  Eosinophilia: - Elevated absolute eosinophil count of 5800 recorded on 12/04/2017.  Most recent absolute eosinophil count was 1400 on 01/06/2018.  Etiology includes reactive versus clonal. -Elevated IgE level on 2 occasions of 850 and 588. -Dr. Ernst Bowler has sent assays for FIP1L1 and PDGFRA fusion.  Strongyloidiasis antibody was negative.  LDH was in the upper limit of normal. - I have reviewed his high-resolution CT scan of the chest which did not reveal any pulmonary involvement.  He does not have any gastrointestinal symptoms. - We will order an echocardiogram.  Troponin levels were negative.  Vitamin B12 was normal. I will repeat a CBC with differential today and review his smear.  We will check tryptase levels.  We will also send his blood for Jak 2 mutation testing and BCR/ABL testing by [facility] panel.  This is to rule out myeloproliferative disorders. - We will also check for FGFR1 and PDGF 4 beta on peripheral blood.  We will follow-up on the infectious disease work-up. -If the absolute eosinophil count remains high, I have recommended bone marrow aspiration biopsy.  We will consider staining for reticulin fibrosis and mast cells.  We will tentatively schedule him for the procedure next week.

## 2018-03-06 ENCOUNTER — Telehealth: Payer: Self-pay | Admitting: Emergency Medicine

## 2018-03-06 LAB — LACTATE DEHYDROGENASE: LDH: 221 IU/L (ref 121–224)

## 2018-03-06 LAB — SPECIMEN STATUS REPORT

## 2018-03-06 NOTE — Telephone Encounter (Signed)
Pt. Does not want to order until he talks to [facility]. He has a co-pay issue. Will wait for [facility] or pt to call me back.

## 2018-03-07 LAB — TRYPTASE: TRYPTASE: 4.4 ug/L (ref 2.2–13.2)

## 2018-03-09 ENCOUNTER — Ambulatory Visit: Payer: 59 | Admitting: Emergency Medicine

## 2018-03-09 ENCOUNTER — Encounter: Payer: Self-pay | Admitting: Emergency Medicine

## 2018-03-09 DIAGNOSIS — D721 Eosinophilia, unspecified: Secondary | ICD-10-CM

## 2018-03-09 DIAGNOSIS — J4550 Severe persistent asthma, uncomplicated: Secondary | ICD-10-CM | POA: Diagnosis not present

## 2018-03-09 LAB — JAK2 GENOTYPR

## 2018-03-09 NOTE — Progress Notes (Signed)
Subjective:    Patient ID: Jose Ross, male    DOB: [birth year], 47 y.o.   MRN: [id number]  Shortness of Breath  Associated symptoms include headaches and wheezing. Pertinent negatives include no ear pain, fever, leg swelling, rash, rhinorrhea, sore throat or vomiting.   47 year old former smoker (15 pk-yrs) with little PMH beyond contact dermatitis, here today for evaluation of dyspnea.  Was well until early April, began to have cough, noise with breathing / wheeze, then some evolving dyspnea w exertion. Cough was initially productive, now not. Has continued to have dyspnea, cough even at night awakening. Now has a lot of clear nasal nasal drainage. He does have some GERD sx several times a week.  He has been seen at Urgent Care and treated with azithro, pred, then again on 2 occasions. Has been using albuterol frequently. Then was treated with levaquin yesterday at another UC in [place].   ROV 12/23/17 --this follow-up visit for patient with a minimal tobacco history with dyspnea and clinical history consistent with asthma.  He has been treated for what sounds like recurrent exacerbations and has been found to have extremely high exhaled nitric oxide level, eosinophil count, IgE.  He was last seen in our office 5/31 by T Parrett. He is now on Dulera and Singulair, completed a pred taper 2 days ago. He is using albuterol about 2x a day. RAST testing > pending. His congestion and cough are better. He still has some dyspnea, chest tightness. CXR 5/23 > bronchitis changes. A CT chest is pending. His FENO today is 74 . 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  He has frequent exacerbations.  He has bony function testing on 12/29/2017 and I have reviewed.  This shows mild obstruction and a positive bronchodilator response, normal lung volumes, normal diffusion capacity.  We have managed him on  Dulera, Singulair, Zyrtec, currently on flonase. He tried nasal saline - couldn't tolerate it.   We referred him for allergy evaluation, will be seen next month. He is having much more nasal gtt, associated dyspnea.  A CT scan of his chest done on 01/02/2018 was reviewed by me and shows no evidence for interstitial disease, some nonspecific scattered pulmonary nodules without a pattern consistent with opportunistic infection or atypical infection.  Some evidence for mild air trapping and small airways disease.   ROV 02/06/18 --follow-up visit for 47 year old man with asthma, eosinophilic phenotype with an elevated IgE.  He has mild obstruction with a positive bronchodilator response by spirometry.  He has been on Dulera, Singulair, Zyrtec and Flonase.  Allergy evaluation was arranged.  We have initiated the process of obtaining Nucala when you find out from time. He continues to have chest tightness, dyspnea, cough that wakes him form sleep. He does get relief from nebulized albuterol. He is supposed to see allergy later this month.   IgE 588 (01/06/2018 Eosinophil 13.2%, 1.4K per microliter (01/06/18)  ROV 03/09/18 --Mr. [name] presents today for follow-up.  He is 47 years old with a minimal tobacco history (15 pack years), history of contact dermatitis.  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  Spirometry confirms mild obstruction with a positive bronchodilator response.  He has responded clinically to prednisone, benefits some from albuterol.  Evaluation has included an unrevealing CT scan of the chest with  a negative ANCA making Churg-Strauss or eosinophilic granulomatosis unlikely.  Likewise he has had negative aspergillus IgE testing effectively ruling out ABPA.  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  I sent him for allergy  evaluation, has seen Dr Dellis Anes, and surprisingly his skin testing was negative.  In light of this he was referred to hematology/oncology for further evaluation of his profound eosinophilia.  No evidence for parasitic infection on serological testing (or clinically).  Genetic testing for possible hypereosinophilic syndrome, myeloproliferative disorders has been sent.  Tryptase level reassuring without any evidence for mastocytosis. He is being considered for BM biopsy depending on where the workup leads.   He does have persistent asthma, but there is now evidence that is may be secondary to some other primary eosinophilic process. I still believe it is appropriate to use Nucala, first dose was 02/12/18. He was treated with another pred taper, is currently off prednisone. When he came off anti-histamines for allergy testing he had significant patchy skin changes, is now back on zyrtec, singulair, on Dulera. He uses albuterol about 4x a day, does note a significant clinical change in breathing when he uses it. His wheeze appears to be better, still has severe exertional SOB.      Review of Systems  Constitutional: Negative for fever and unexpected weight change.  HENT: Positive for congestion, nosebleeds, sinus pressure and sneezing. Negative for dental problem, ear pain, postnasal drip, rhinorrhea, sore throat and trouble swallowing.   Eyes: Negative for redness and itching.  Respiratory: Positive for cough, chest tightness, shortness of breath and wheezing.   Cardiovascular: Negative for palpitations and leg swelling.  Gastrointestinal: Negative for nausea and vomiting.  Genitourinary: Negative for dysuria.  Musculoskeletal: Negative for joint swelling.  Skin: Negative for rash.  Allergic/Immunologic: Positive for environmental allergies. Negative for food allergies and immunocompromised state.  Neurological: Positive for headaches.  Hematological: Does not bruise/bleed easily.    Psychiatric/Behavioral: Negative for dysphoric mood. The patient is not nervous/anxious.         Objective:   Physical Exam Vitals:   03/09/18 1016  BP: (!) 136/98  Pulse: 80  SpO2: 100%  Weight: 243 lb (110.2 kg)  Height: 5\' 10"  (1.778 m)   Gen: Pleasant, well-nourished, mild resp distress,  normal affect  ENT: No lesions,  mouth clear,  oropharynx clear but crowded significant PND and nasal obstruction  Neck: No JVD, no stridor   Lungs: No use of accessory muscles, severe wheeze all fields.   Cardiovascular: RRR, heart sounds normal, no murmur or gallops, no peripheral edema  Musculoskeletal: No deformities, no cyanosis or clubbing  Neuro: alert, non focal  Skin: Warm, no lesions or rash     Assessment & Plan:  Asthma He does have persistent asthma, but there is now evidence that is may be secondary to some other primary eosinophilic process.  This will be rare but is possible.  No current evidence for parasitic infection, Aspergillus sensitivity, Churg-Strauss or granulomatous disease, mastocytosis.  He has a very thorough hematology work-up pending.  I believe that in the and this will likely still be principally severe persistent asthma.  Of note his skin testing was done on prednisone.  I still believe it is appropriate to use Nucala, first dose was 02/12/18. He is currently off prednisone and is tolerating.  His wheezing is actually markedly improved although he remains dyspneic.  He requires albuterol through the day.  We  will work on trying to get financial assistance or confirm adequate insurance coverage for the [place].  Continue his other allergy and asthma regimen as he has been taking it.  Eosinophilia Appreciate Allergy and H/O evaluation.   Levy Pupaobert Charie Pinkus, MD, PhD 03/09/2018, 10:42 AM [facility] [phone] or if no answer [phone]

## 2018-03-09 NOTE — Patient Instructions (Addendum)
Agree with continuing your evaluation with hematology/oncology, follow your genetic testing and bone marrow evaluation. Please continue your Zyrtec, Singulair and Dulera as you are taking them. We will stay off of prednisone for now. Continue Nucala monthly for now.  We will need to work on assessing the cost through [employer] insurance and through [employer]. I do not believe you can return to work yet.  We will continue to work with Ad[employer] PerlProcter & Elsie LincolnGamble to arrange leave.  We will try to get your back to work as soon as safely possible. Follow with Dr Delton CoombesByrum in 1 month or next available

## 2018-03-09 NOTE — Assessment & Plan Note (Addendum)
He does have persistent asthma, but there is now evidence that is may be secondary to some other primary eosinophilic process.  This will be rare but is possible.  No current evidence for parasitic infection, Aspergillus sensitivity, Churg-Strauss or granulomatous disease, mastocytosis.  He has a very thorough hematology work-up pending.  I believe that in the and this will likely still be principally severe persistent asthma.  Of note his skin testing was done on prednisone.  I still believe it is appropriate to use Nucala, first dose was 02/12/18. He is currently off prednisone and is tolerating.  His wheezing is actually markedly improved although he remains dyspneic.  He requires albuterol through the day.  We will work on trying to get financial assistance or confirm adequate insurance coverage for the [place].  Continue his other allergy and asthma regimen as he has been taking it.

## 2018-03-09 NOTE — Assessment & Plan Note (Signed)
Appreciate Allergy and H/O evaluation.

## 2018-03-10 DIAGNOSIS — J4551 Severe persistent asthma with (acute) exacerbation: Secondary | ICD-10-CM | POA: Diagnosis not present

## 2018-03-10 DIAGNOSIS — D721 Eosinophilia: Secondary | ICD-10-CM | POA: Diagnosis not present

## 2018-03-10 LAB — TOXOCARA ANTIBODY

## 2018-03-10 LAB — TOXOPLASMA ANTIBODIES- IGG AND  IGM: Toxoplasma Antibody- IgM: <3 AU/mL (ref 0.0–7.9)

## 2018-03-10 LAB — TRYPTASE: TRYPTASE: 4.5 ug/L (ref 2.2–13.2)

## 2018-03-11 ENCOUNTER — Other Ambulatory Visit (HOSPITAL_COMMUNITY): Payer: Self-pay | Admitting: Nurse Practitioner

## 2018-03-11 ENCOUNTER — Ambulatory Visit: Payer: 59

## 2018-03-11 LAB — LACTATE DEHYDROGENASE: LDH: 242 IU/L — AB (ref 121–224)

## 2018-03-11 NOTE — Telephone Encounter (Addendum)
Called pt to let him know his nucala won't be here till 3:00 03/11/18. Lmom telling him he could call the schedulers and ask them to cancel today's appt and rsc when pt can come in. Also asked pt to call me if he has any questions.

## 2018-03-11 NOTE — Telephone Encounter (Signed)
I called [facility] 03/09/18,pt. Had a co-pay issue. Called pt and gave him the # to call. Pt. Told me he gave his consent to ship but it was actually for co-pay asst.. So I called pt back and explained he would have to call back and give his consrnt to ship. He did. The earilest they could get it to us is 04/12/18 before 3:00. Pt's appt. Was today. Pt. Was out when I talked to him last.(03/10/18) He said he'd call back and rsc..  1 Vial Order Date: 03/10/18 Shipping Date: 8//28/19

## 2018-03-12 ENCOUNTER — Encounter (HOSPITAL_COMMUNITY): Payer: 59 | Admitting: Hematology

## 2018-03-12 NOTE — Telephone Encounter (Signed)
1 vial Arrival Date:03/12/18 Lot #: [id number] Exp Date:09/2019

## 2018-03-13 ENCOUNTER — Telehealth: Payer: Self-pay

## 2018-03-13 ENCOUNTER — Ambulatory Visit (INDEPENDENT_AMBULATORY_CARE_PROVIDER_SITE_OTHER): Payer: 59

## 2018-03-13 DIAGNOSIS — J4550 Severe persistent asthma, uncomplicated: Secondary | ICD-10-CM | POA: Diagnosis not present

## 2018-03-13 MED ORDER — MEPOLIZUMAB 100 MG ~~LOC~~ SOLR
100.0000 mg | Freq: Once | SUBCUTANEOUS | Status: AC
  Administered 2018-03-13: 100 mg via SUBCUTANEOUS

## 2018-03-13 NOTE — Progress Notes (Signed)
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 We will send to the patient to get collected at [facility] in [place]. I do not know what happened to the original order.  Otherwise, all of the other labs that I placed have been collected. I will mail this lab order to the patient.   Salvatore Marvel, MD [facility]

## 2018-03-13 NOTE — Telephone Encounter (Signed)
-----   Message from Alfonse SpruceJoel Louis Gallagher, MD sent at 03/13/2018  9:20 AM EDT ----- We are putting the lab order in the mail. Can you call [name] to let him know? It seems that I ordered it under [facility] instead of [facility].Marland Kitchen..Marland Kitchen

## 2018-03-13 NOTE — Telephone Encounter (Signed)
Called patient and received a busy signal so was unable to leave a message will try again later.

## 2018-03-13 NOTE — Addendum Note (Signed)
Addended by: Alfonse SpruceGALLAGHER, Rosibel Giacobbe LOUIS on: 03/13/2018 09:20 AM   Modules accepted: Orders

## 2018-03-17 ENCOUNTER — Telehealth: Payer: Self-pay | Admitting: Emergency Medicine

## 2018-03-17 NOTE — Telephone Encounter (Signed)
Spoke with patient and informed him of this, patient is not too happy since this is the third time he is having to get his labs redrawn. Patient will wait until he receives paperwork in mail and go.

## 2018-03-17 NOTE — Telephone Encounter (Signed)
I have not come across this side effect with nucala in my limited experience. Does not mean it is not from nucala. Recommend - face to face visit with an app today or 03/18/18 to get a better eval

## 2018-03-17 NOTE — Telephone Encounter (Signed)
Pt has been scheduled for acute visit with Tonya on 03/18/18 at 11:00a. Nothing further is needed.

## 2018-03-17 NOTE — Telephone Encounter (Signed)
Called and spoke to pt. Pt states since receiving Nucala injection on 03/13/18. he has developed nasal congestion, swollen lymph node in his neck and increased fatigue. Pt stated symptoms developed on Sunday. Pt denied any fever, chills, sweats, body aches or additional symptoms.  Pt is requesting recommendations prior to scheduling acute visit.   MR please advise, as RB is unavailable. Thanks

## 2018-03-18 ENCOUNTER — Ambulatory Visit: Payer: 59 | Admitting: Nurse Practitioner

## 2018-03-18 ENCOUNTER — Encounter (HOSPITAL_COMMUNITY): Payer: Self-pay | Admitting: Hematology

## 2018-03-18 ENCOUNTER — Encounter: Payer: Self-pay | Admitting: Nurse Practitioner

## 2018-03-18 VITALS — BP 134/96 | HR 116 | Temp 97.8°F | Ht 70.0 in | Wt 241.2 lb

## 2018-03-18 DIAGNOSIS — J0100 Acute maxillary sinusitis, unspecified: Secondary | ICD-10-CM | POA: Diagnosis not present

## 2018-03-18 DIAGNOSIS — J301 Allergic rhinitis due to pollen: Secondary | ICD-10-CM | POA: Diagnosis not present

## 2018-03-18 DIAGNOSIS — R6889 Other general symptoms and signs: Secondary | ICD-10-CM

## 2018-03-18 LAB — POCT INFLUENZA A/B
INFLUENZA A, POC: NEGATIVE
INFLUENZA B, POC: NEGATIVE

## 2018-03-18 MED ORDER — PREDNISONE 10 MG PO TABS: ORAL_TABLET | ORAL | 0 refills | Status: DC

## 2018-03-18 MED ORDER — AMOXICILLIN 875 MG PO TABS: 875.0000 mg | ORAL_TABLET | Freq: Two times a day (BID) | ORAL | 0 refills | Status: AC

## 2018-03-18 NOTE — Patient Instructions (Addendum)
I do not think this is a reaction to Nucala injection Flu test was negative Will order amoxicillin Will order prednisone taper May take mucinex Continue zyrtec, Singulair, and dulera Please call if symptoms worsen Please kep upcoming appointment with Dr. Delton Coombes already scheduled on 04/09/18

## 2018-03-18 NOTE — Assessment & Plan Note (Signed)
Continue zyrtec and singulair

## 2018-03-18 NOTE — Assessment & Plan Note (Addendum)
Patient Instructions  I do not think this is a reaction to Nucala injection Flu test was negative Will order amoxicillin Will order prednisone taper May take mucinex Continue zyrtec, Singulair, and dulera Please call if symptoms worsen Please kep upcoming appointment with Dr. Delton Coombes already scheduled on 04/09/18

## 2018-03-18 NOTE — Progress Notes (Signed)
@Patient  ID: Jose Ross, male    DOB: Oct 18, 1970, 47 y.o.   MRN: [id number]  Chief Complaint  Patient presents with  . Follow-up    Had shot friday, feels like lymph nodes are swollen, chills, had to use inhaler 3 times last night.    Referring provider: No ref. provider found  HPI   47 year old male former smokerwith severe asthma and eosinophilia followed by Dr. Delton Coombes.   Tests: CT 01-01-18 - No findings to suggest interstitial lung disease. A few scattered pulmonary nodules measuring 4 mm or less, nonspecific but favored to be benign.on CT Images: From the [employer] 2017;  Mild air trapping indicative of small airways disease.  OV 03/18/18 - Acute Patient presents for sinus congestion/pain, swollen lymph nodes, shortness of breath and wheezing. Symptoms started Sunday and have progressively worsened. Denies any fever, but states that he feels like he has the flu. He has had chills. He got his second Nucala injection on Friday. He takes zyrtec, singulair, on Dulera. He uses albuterol about 4x a day.    No Known Allergies   There is no immunization history on file for this patient.  History reviewed. No pertinent past medical history.  Tobacco History: Social History   Tobacco Use  Smoking Status Former Smoker  . Packs/day: 0.25  . Years: 10.00  . Pack years: 2.50  . Types: Cigarettes  . Last attempt to quit: 2000  . Years since quitting: 19.6  Smokeless Tobacco Never Used   Counseling given: Yes   Outpatient Encounter Medications as of 03/18/2018  Medication Sig  . albuterol (PROVENTIL HFA;VENTOLIN HFA) 108 (90 Base) MCG/ACT inhaler Inhale 2 puffs into the lungs every 4 (four) hours as needed for wheezing or shortness of breath (cough, shortness of breath or wheezing.).  Marland Kitchen albuterol (PROVENTIL) (2.5 MG/3ML) 0.083% nebulizer solution Take 3 mLs (2.5 mg total) by nebulization every 4 (four) hours as needed for wheezing or shortness of breath.  . benzonatate  (TESSALON) 200 MG capsule TAKE ONE CAPSULE BY MOUTH THREE TIMES DAILY AS NEEDED FOR COUGH  . EPINEPHrine 0.3 mg/0.3 mL IJ SOAJ injection Inject one dose intramuscularly for allergic reaction. May repeat one dose if needed after 5-15 minutes. Proceed to the ER  . Mepolizumab (NUCALA [place]) Inject 1 mg into the skin every 28 (twenty-eight) days.   . mometasone-formoterol (DULERA) 100-5 MCG/ACT AERO Inhale 2 puffs into the lungs 2 (two) times daily.  . montelukast (SINGULAIR) 10 MG tablet Take 1 tablet (10 mg total) by mouth at bedtime.  Marland Kitchen amoxicillin (AMOXIL) 875 MG tablet Take 1 tablet (875 mg total) by mouth 2 (two) times daily for 10 days.  . predniSONE (DELTASONE) 10 MG tablet Take 3 tabs for 2 days, then 2 tabs for 2 days, then 1 tab for 2 days, then stop   Facility-Administered Encounter Medications as of 03/18/2018  Medication  . Mepolizumab SOLR 100 mg     Review of Systems  Review of Systems  Constitutional: Positive for chills. Negative for fever.  HENT: Positive for congestion, postnasal drip, sinus pressure, sinus pain and sore throat.   Respiratory: Positive for cough and shortness of breath.   Cardiovascular: Negative.   Gastrointestinal: Negative.   Allergic/Immunologic: Negative.   Neurological: Negative.   Psychiatric/Behavioral: Negative.        Physical Exam  BP (!) 134/96 (BP Location: Left Arm, Patient Position: Sitting, Cuff Size: Normal)   Pulse (!) 116   Temp 97.8 F (36.6 C)  Ht 5\' 10"  (1.778 m)   Wt 241 lb 3.2 oz (109.4 kg)   SpO2 98%   BMI 34.61 kg/m   Wt Readings from Last 5 Encounters:  03/18/18 241 lb 3.2 oz (109.4 kg)  03/09/18 243 lb (110.2 kg)  03/05/18 241 lb 1.6 oz (109.4 kg)  03/03/18 242 lb (109.8 kg)  02/24/18 237 lb 12.8 oz (107.9 kg)     Physical Exam  Constitutional: He is oriented to person, place, and time. He appears well-developed and well-nourished. No distress.  HENT:  Mouth/Throat: Posterior oropharyngeal erythema present.  No oropharyngeal exudate.  Cardiovascular: Normal rate and regular rhythm.  Pulmonary/Chest: Effort normal and breath sounds normal. No stridor. No respiratory distress. He has no wheezes. He has no rales.  Lymphadenopathy:    He has cervical adenopathy.  Neurological: He is alert and oriented to person, place, and time.  Skin: Skin is warm and dry.  Psychiatric: He has a normal mood and affect.  Nursing note and vitals reviewed.    Assessment & Plan:   Acute non-recurrent maxillary sinusitis Patient Instructions  I do not think this is a reaction to Nucala injection Flu test was negative Will order amoxicillin Will order prednisone taper May take mucinex Continue zyrtec, Singulair, and dulera Please call if symptoms worsen Please kep upcoming appointment with Dr. Delton Coombes already scheduled on 04/09/18     Allergic rhinitis Continue zyrtec and singulair     Ivonne Andrew, NP 03/18/2018

## 2018-03-20 ENCOUNTER — Ambulatory Visit (HOSPITAL_COMMUNITY)
Admission: RE | Admit: 2018-03-20 | Discharge: 2018-03-20 | Disposition: A | Discharge status: Home / Self Care | Payer: 59 | Source: Ambulatory Visit | Attending: Nurse Practitioner | Admitting: Nurse Practitioner

## 2018-03-20 ENCOUNTER — Ambulatory Visit (HOSPITAL_BASED_OUTPATIENT_CLINIC_OR_DEPARTMENT_OTHER)
Admission: RE | Admit: 2018-03-20 | Discharge: 2018-03-20 | Disposition: A | Discharge status: Home / Self Care | Payer: 59 | Source: Ambulatory Visit | Attending: Nurse Practitioner | Admitting: Nurse Practitioner

## 2018-03-20 ENCOUNTER — Encounter (HOSPITAL_COMMUNITY): Payer: Self-pay

## 2018-03-20 DIAGNOSIS — D721 Eosinophilia, unspecified: Secondary | ICD-10-CM

## 2018-03-20 DIAGNOSIS — R911 Solitary pulmonary nodule: Secondary | ICD-10-CM | POA: Insufficient documentation

## 2018-03-20 MED ORDER — IOPAMIDOL (ISOVUE-300) INJECTION 61%
100.0000 mL | Freq: Once | INTRAVENOUS | Status: AC | PRN
  Administered 2018-03-20: 100 mL via INTRAVENOUS

## 2018-03-20 NOTE — Progress Notes (Signed)
*  PRELIMINARY RESULTS* Echocardiogram 2D Echocardiogram has been performed.  Stacey Drain 03/20/2018, 9:54 AM

## 2018-03-29 ENCOUNTER — Other Ambulatory Visit: Payer: Self-pay | Admitting: Emergency Medicine

## 2018-04-02 ENCOUNTER — Encounter: Payer: Self-pay | Admitting: Nurse Practitioner

## 2018-04-02 ENCOUNTER — Ambulatory Visit (INDEPENDENT_AMBULATORY_CARE_PROVIDER_SITE_OTHER)
Admission: RE | Admit: 2018-04-02 | Discharge: 2018-04-02 | Disposition: A | Discharge status: Home / Self Care | Payer: 59 | Source: Ambulatory Visit | Attending: Nurse Practitioner | Admitting: Nurse Practitioner

## 2018-04-02 ENCOUNTER — Ambulatory Visit: Payer: 59 | Admitting: Nurse Practitioner

## 2018-04-02 VITALS — BP 136/96 | HR 82 | Ht 70.0 in | Wt 243.4 lb

## 2018-04-02 DIAGNOSIS — J4550 Severe persistent asthma, uncomplicated: Secondary | ICD-10-CM

## 2018-04-02 MED ORDER — LEVALBUTEROL HCL 0.63 MG/3ML IN NEBU
0.6300 mg | INHALATION_SOLUTION | Freq: Once | RESPIRATORY_TRACT | Status: AC
  Administered 2018-04-02: 0.63 mg via RESPIRATORY_TRACT

## 2018-04-02 MED ORDER — METHYLPREDNISOLONE ACETATE 80 MG/ML IJ SUSP
80.0000 mg | Freq: Once | INTRAMUSCULAR | Status: AC
  Administered 2018-04-02: 80 mg via INTRAMUSCULAR

## 2018-04-02 MED ORDER — PREDNISONE 20 MG PO TABS: 20.0000 mg | ORAL_TABLET | Freq: Every day | ORAL | 0 refills | Status: AC

## 2018-04-02 NOTE — Assessment & Plan Note (Signed)
Patient Instructions  Will start on prednisone daily 20 mg Will order chest x ray today and call with results depomedrol given in office today Continue dulera, and Proventil Continue Singulair Continue nucala injections Make take mucinex DepoMedrol given in office today Keep already scheduled appointment with Dr. Delton CoombesByrum for next week Please go to the ED if symptoms worsen

## 2018-04-02 NOTE — Progress Notes (Signed)
@Patient  ID: Jose SanfilippoFrederick Ross, male    DOB: 10/07/1970, 47 y.o.   MRN: [id number]  Chief Complaint  Patient presents with  . Asthma    Referring provider: No ref. provider found  HPI  47 year old former smoker with moderate to severe persistent asthma, allergic rhinitis, hyper-eosinophilia, and elevated IgE followed by Dr. Delton CoombesByrum. Health history includes contact dermatitis.   Synopsis from OV8/26/19: He is 47 years old with a minimal tobacco history (15 pack years), history of contact dermatitis.  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  Spirometry confirms mild obstruction with a positive bronchodilator response.  He has responded clinically to prednisone, benefits some from albuterol.  Evaluation has included an unrevealing CT scan of the chest with a negative ANCA making Churg-Strauss or eosinophilic granulomatosis unlikely.  Likewise he has had negative aspergillus IgE testing effectively ruling out ABPA.  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  Has seen Dr Dellis AnesGallagher (allergy), and surprisingly his skin testing was negative.  In light of this he was referred to hematology/oncology for further evaluation of his profound eosinophilia.  No evidence for parasitic infection on serological testing (or clinically).  Genetic testing for possible hypereosinophilic syndrome, myeloproliferative disorders has been sent.  Tryptase level reassuring without any evidence for mastocytosis. He is being considered for BM biopsy depending on where the workup leads.   Tests:  CT 01-01-18 - No findings to suggest interstitial lung disease. A few scattered pulmonary nodules measuring 4 mm or less, nonspecific but favored to be benign.on CT Images: From the Fleischner Society 2017;  Mild air trapping indicative of small airways  disease.  OV 04/02/18 - Acute wheezing/shortness of breath Patient presents for wheezing and shortness of breath. Symptoms started 2-3 weeks ago. He states that symptoms are progressively worsening. He has been compliant with Proventil, dulera, and Singulair. He started nucala injections in early August. He was seen by me on 03/18/18 and was given a round of amoxicillin and prednisone taper. At that time he was having sinus symptoms and states that his sinus congestion and pressure have resolved. His Wheezing and shortness of breath did get a little better, but states that wheezing has now returned since off prednisone.     No Known Allergies   There is no immunization history on file for this patient.  History reviewed. No pertinent past medical history.  Tobacco History: Social History   Tobacco Use  Smoking Status Former Smoker  . Packs/day: 0.25  . Years: 10.00  . Pack years: 2.50  . Types: Cigarettes  . Last attempt to quit: 2000  . Years since quitting: 19.7  Smokeless Tobacco Never Used   Counseling given: Yes   Outpatient Encounter Medications as of 04/02/2018  Medication Sig  . albuterol (PROVENTIL HFA;VENTOLIN HFA) 108 (90 Base) MCG/ACT inhaler Inhale 2 puffs into the lungs every 4 (four) hours as needed for wheezing or shortness of breath (cough, shortness of breath or wheezing.).  Marland Kitchen. albuterol (PROVENTIL) (2.5 MG/3ML) 0.083% nebulizer solution Take 3 mLs (2.5 mg total) by nebulization every 4 (four) hours as needed for wheezing or shortness of breath.  . benzonatate (TESSALON) 200 MG capsule TAKE ONE CAPSULE BY MOUTH THREE TIMES DAILY AS NEEDED FOR COUGH  . EPINEPHrine 0.3 mg/0.3 mL IJ SOAJ injection Inject one dose intramuscularly for allergic reaction. May repeat one  dose if needed after 5-15 minutes. Proceed to the ER  . Mepolizumab (NUCALA [place]) Inject 1 mg into the skin every 28 (twenty-eight) days.   . mometasone-formoterol (DULERA) 100-5 MCG/ACT AERO Inhale 2 puffs  into the lungs 2 (two) times daily.  . montelukast (SINGULAIR) 10 MG tablet Take 1 tablet (10 mg total) by mouth at bedtime.  Marland Kitchen NUCALA 100 MG/ML SOSY INJECT 100MG  SUBCUTANEOUSLY EVERY 4 WEEKS (GIVEN AT PRESCRIBERS OFFICE)  . predniSONE (DELTASONE) 20 MG tablet Take 1 tablet (20 mg total) by mouth daily with breakfast.  . [DISCONTINUED] predniSONE (DELTASONE) 10 MG tablet Take 3 tabs for 2 days, then 2 tabs for 2 days, then 1 tab for 2 days, then stop (Patient not taking: Reported on 04/02/2018)   Facility-Administered Encounter Medications as of 04/02/2018  Medication  . [COMPLETED] levalbuterol (XOPENEX) nebulizer solution 0.63 mg  . Mepolizumab SOLR 100 mg  . [COMPLETED] methylPREDNISolone acetate (DEPO-MEDROL) injection 80 mg     Review of Systems  Review of Systems  Constitutional: Negative for chills and fever.  HENT: Negative for congestion, postnasal drip and sinus pain.   Respiratory: Positive for shortness of breath and wheezing.   Cardiovascular: Negative for chest pain, palpitations and leg swelling.       Physical Exam  BP (!) 136/96 (BP Location: Right Arm, Patient Position: Sitting, Cuff Size: Normal)   Pulse 82   Ht 5\' 10"  (1.778 m)   Wt 243 lb 6.4 oz (110.4 kg)   SpO2 96%   BMI 34.92 kg/m   Wt Readings from Last 5 Encounters:  04/02/18 243 lb 6.4 oz (110.4 kg)  03/18/18 241 lb 3.2 oz (109.4 kg)  03/09/18 243 lb (110.2 kg)  03/05/18 241 lb 1.6 oz (109.4 kg)  03/03/18 242 lb (109.8 kg)     Physical Exam  Constitutional: He is oriented to person, place, and time. He appears well-developed and well-nourished. No distress.  Cardiovascular: Normal rate and regular rhythm.  Pulmonary/Chest: Effort normal. He has wheezes.  Musculoskeletal: He exhibits no edema.  Neurological: He is alert and oriented to person, place, and time.  Skin: Skin is warm and dry.  Psychiatric: He has a normal mood and affect.  Nursing note and vitals reviewed.    Imaging: Dg  Chest 2 View  Result Date: 04/02/2018 CLINICAL DATA:  Cough, wheezing, chest tightness EXAM: CHEST - 2 VIEW COMPARISON:  12/04/2017 FINDINGS: Mild peribronchial thickening. Heart and mediastinal contours are within normal limits. No focal opacities or effusions. No acute bony abnormality. IMPRESSION: Mild bronchitic changes. Electronically Signed   By: Charlett Nose M.D.   On: 04/02/2018 09:58   Ct Abdomen W Contrast  Result Date: 03/20/2018 CLINICAL DATA:  Eosinophilia. EXAM: CT ABDOMEN WITH CONTRAST TECHNIQUE: Multidetector CT imaging of the abdomen was performed using the standard protocol following bolus administration of intravenous contrast. CONTRAST:  ISOVUE-300 IOPAMIDOL (ISOVUE-300) INJECTION 61% COMPARISON:  None. FINDINGS: Lower chest: 6 mm left lower lobe lung nodule is partially obscured by motion artifact, image 78/5. Previously 4 mm. Hepatobiliary: 3 small low-density foci within both lobes of liver measure up to 6 mm. These are too small to reliably characterize. Gallbladder appears normal. No biliary dilatation. Pancreas: Unremarkable. No pancreatic ductal dilatation or surrounding inflammatory changes. Spleen: The spleen measures 10.5 by 6.5 by 12.6 cm (volume = 450 cm^3). Adrenals/Urinary Tract: Normal adrenal glands. Normal appearance of the right kidney. Left kidney cyst measures 1.2 cm. No mass or hydronephrosis identified bilaterally. Stomach/Bowel: Small hiatal hernia.  The visualized bowel loops within the abdomen are unremarkable. The appendix is visualized and appears normal. Vascular/Lymphatic: Normal appearance of the abdominal aorta. No enlarged lymph nodes. Other: No free fluid or fluid collections. Musculoskeletal: No acute or significant osseous findings. IMPRESSION: 1. No acute findings identified within the abdomen. 2. The spleen is upper limits of normal in size measuring 12.6 cm in length with a volume of 450 cc. 3. Left lower lobe pulmonary nodule, partially obscured by  motion artifact measures 6 mm. Non-contrast chest CT at 3-6 months is recommended. If the nodules are stable at time of repeat CT, then future CT at 18-24 months (from today's scan) is considered optional for low-risk patients, but is recommended for high-risk patients. This recommendation follows the consensus statement: Guidelines for Management of Incidental Pulmonary Nodules Detected on CT Images: From the Fleischner Society 2017; Radiology 2017; 284:228-243. Electronically Signed   By: Signa Kell M.D.   On: 03/20/2018 12:06     Assessment & Plan:   Asthma Patient Instructions  Will start on prednisone daily 20 mg Will order chest x ray today and call with results depomedrol given in office today Continue dulera, and Proventil Continue Singulair Continue nucala injections Make take mucinex DepoMedrol given in office today Keep already scheduled appointment with Dr. Delton Coombes for next week Please go to the ED if symptoms worsen        Ivonne Andrew, NP 04/02/2018

## 2018-04-02 NOTE — Patient Instructions (Addendum)
Will start on prednisone daily 20 mg Will order chest x ray today and call with results depomedrol given in office today Continue dulera, and Proventil Continue Singulair Continue nucala injections Make take mucinex DepoMedrol given in office today Keep already scheduled appointment with Dr. Delton CoombesByrum for next week Please go to the ED if symptoms worsen

## 2018-04-09 ENCOUNTER — Encounter: Payer: Self-pay | Admitting: Emergency Medicine

## 2018-04-09 ENCOUNTER — Ambulatory Visit: Payer: 59 | Admitting: Emergency Medicine

## 2018-04-09 ENCOUNTER — Encounter (HOSPITAL_COMMUNITY): Payer: Self-pay | Admitting: Hematology

## 2018-04-09 ENCOUNTER — Telehealth: Payer: Self-pay | Admitting: Emergency Medicine

## 2018-04-09 ENCOUNTER — Inpatient Hospital Stay (HOSPITAL_COMMUNITY): Payer: 59 | Attending: Hematology | Admitting: Hematology

## 2018-04-09 ENCOUNTER — Inpatient Hospital Stay (HOSPITAL_COMMUNITY): Payer: 59

## 2018-04-09 ENCOUNTER — Other Ambulatory Visit: Payer: Self-pay

## 2018-04-09 VITALS — BP 137/92 | HR 86 | Resp 22 | Wt 244.2 lb

## 2018-04-09 DIAGNOSIS — D721 Eosinophilia, unspecified: Secondary | ICD-10-CM

## 2018-04-09 DIAGNOSIS — Z87891 Personal history of nicotine dependence: Secondary | ICD-10-CM | POA: Insufficient documentation

## 2018-04-09 DIAGNOSIS — J4550 Severe persistent asthma, uncomplicated: Secondary | ICD-10-CM | POA: Diagnosis not present

## 2018-04-09 DIAGNOSIS — J4540 Moderate persistent asthma, uncomplicated: Secondary | ICD-10-CM

## 2018-04-09 DIAGNOSIS — K219 Gastro-esophageal reflux disease without esophagitis: Secondary | ICD-10-CM | POA: Diagnosis not present

## 2018-04-09 DIAGNOSIS — J301 Allergic rhinitis due to pollen: Secondary | ICD-10-CM

## 2018-04-09 DIAGNOSIS — Z79899 Other long term (current) drug therapy: Secondary | ICD-10-CM | POA: Diagnosis not present

## 2018-04-09 LAB — CBC WITH DIFFERENTIAL/PLATELET
BASOS ABS: 0.1 10*3/uL (ref 0.0–0.1)
BASOS PCT: 1 %
EOS ABS: 0.6 10*3/uL (ref 0.0–0.7)
EOS PCT: 5 %
HCT: 49.4 % (ref 39.0–52.0)
HEMOGLOBIN: 16.9 g/dL (ref 13.0–17.0)
Lymphocytes Relative: 17 %
Lymphs Abs: 2.1 10*3/uL (ref 0.7–4.0)
MCH: 32.8 pg (ref 26.0–34.0)
MCHC: 34.2 g/dL (ref 30.0–36.0)
MCV: 95.9 fL (ref 78.0–100.0)
Monocytes Absolute: 0.7 10*3/uL (ref 0.1–1.0)
Monocytes Relative: 6 %
NEUTROS PCT: 71 %
Neutro Abs: 8.6 10*3/uL — ABNORMAL HIGH (ref 1.7–7.7)
PLATELETS: 197 10*3/uL (ref 150–400)
RBC: 5.15 MIL/uL (ref 4.22–5.81)
RDW: 13.4 % (ref 11.5–15.5)
WBC: 12 10*3/uL — AB (ref 4.0–10.5)

## 2018-04-09 NOTE — Progress Notes (Signed)
Subjective:    Patient ID: Jose Ross, male    DOB: 1970-11-09, 47 y.o.   MRN: [id number]  Shortness of Breath  Associated symptoms include headaches and wheezing. Pertinent negatives include no ear pain, fever, leg swelling, rash, rhinorrhea, sore throat or vomiting. His past medical history is significant for asthma.  Asthma  He complains of cough, shortness of breath and wheezing. Associated symptoms include headaches and sneezing. Pertinent negatives include no ear pain, fever, postnasal drip, rhinorrhea, sore throat or trouble swallowing. His past medical history is significant for asthma.   47 year old former smoker (15 pk-yrs) with little PMH beyond contact dermatitis, here today for evaluation of dyspnea.  Was well until early April, began to have cough, noise with breathing / wheeze, then some evolving dyspnea w exertion. Cough was initially productive, now not. Has continued to have dyspnea, cough even at night awakening. Now has a lot of clear nasal nasal drainage. He does have some GERD sx several times a week.  He has been seen at Urgent Care and treated with azithro, pred, then again on 2 occasions. Has been using albuterol frequently. Then was treated with levaquin yesterday at another UC in [place].   ROV 12/23/17 --this follow-up visit for patient with a minimal tobacco history with dyspnea and clinical history consistent with asthma.  He has been treated for what sounds like recurrent exacerbations and has been found to have extremely high exhaled nitric oxide level, eosinophil count, IgE.  He was last seen in our office 5/31 by T Parrett. He is now on Dulera and Singulair, completed a pred taper 2 days ago. He is using albuterol about 2x a day. RAST testing > pending. His congestion and cough are better. He still has some dyspnea, chest tightness. CXR 5/23 > bronchitis changes. A CT chest is pending. His FENO today is 74 . 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  He has frequent exacerbations.  He has bony function testing on 12/29/2017 and I have reviewed.  This shows mild obstruction and a positive bronchodilator response, normal lung volumes, normal diffusion capacity.  We have managed him on Dulera, Singulair, Zyrtec, currently on flonase. He tried nasal saline - couldn't tolerate it.   We referred him for allergy evaluation, will be seen next month. He is having much more nasal gtt, associated dyspnea.  A CT scan of his chest done on 01/02/2018 was reviewed by me and shows no evidence for interstitial disease, some nonspecific scattered pulmonary nodules without a pattern consistent with opportunistic infection or atypical infection.  Some evidence for mild air trapping and small airways disease.   ROV 02/06/18 --follow-up visit for 47 year old man with asthma, eosinophilic phenotype with an elevated IgE.  He has mild obstruction with a positive bronchodilator response by spirometry.  He has been on Dulera, Singulair, Zyrtec and Flonase.  Allergy evaluation was arranged.  We have initiated the process of obtaining Nucala when you find out from time. He continues to have chest tightness, dyspnea, cough that wakes him form sleep. He does get relief from nebulized albuterol. He is supposed to see allergy later this month.   IgE 588 (01/06/2018 Eosinophil 13.2%, 1.4K per microliter (01/06/18)  ROV 03/09/18 --[name] presents today for follow-up.  He is 46 years old with a minimal tobacco history (15 pack years), history of contact dermatitis.  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  Spirometry confirms mild obstruction with a positive bronchodilator response.  He has responded clinically to prednisone, benefits some from albuterol.  Evaluation has included an  unrevealing CT scan of the chest with a negative ANCA making Churg-Strauss or eosinophilic granulomatosis unlikely.  Likewise he has had negative aspergillus IgE testing effectively ruling out ABPA.  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  I sent him for allergy evaluation, has seen Dr Ernst Bowler, and surprisingly his skin testing was negative.  In light of this he was referred to hematology/oncology for further evaluation of his profound eosinophilia.  No evidence for parasitic infection on serological testing (or clinically).  Genetic testing for possible hypereosinophilic syndrome, myeloproliferative disorders has been sent.  Tryptase level reassuring without any evidence for mastocytosis. He is being considered for BM biopsy depending on where the workup leads.   He does have persistent asthma, but there is now evidence that is may be secondary to some other primary eosinophilic process. I still believe it is appropriate to use Nucala, first dose was 02/12/18. He was treated with another pred taper, is currently off prednisone. When he came off anti-histamines for allergy testing he had significant patchy skin changes, is now back on zyrtec, singulair, on Dulera. He uses albuterol about 4x a day, does note a significant clinical change in breathing when he uses it. His wheeze appears to be better, still has severe exertional SOB.   ROV 04/09/18 --this is a follow-up visit for severe persistent asthma in the setting of hyper eosinophilia and an elevated IgE.  He has mild obstruction on his pulmonary function testing.  I evaluated him for possible ABPA, Churg-Strauss or eosinophilic granulomatosis without any evidence of any.  He has continued to be symptomatic even after starting on Nucala, he has had 2 doses.  His work-up for his hypereosinophilia has included cytogenetics which were normal. We started him on prednisone 64m last week because  he was having persistent wheeze, cough, dyspnea. He is on nexium qd. Remains on singulair, Dulera, using albuterol several times a day. On zyrtec.      Review of Systems  Constitutional: Negative for fever and unexpected weight change.  HENT: Positive for congestion, nosebleeds, sinus pressure and sneezing. Negative for dental problem, ear pain, postnasal drip, rhinorrhea, sore throat and trouble swallowing.   Eyes: Negative for redness and itching.  Respiratory: Positive for cough, chest tightness, shortness of breath and wheezing.   Cardiovascular: Negative for palpitations and leg swelling.  Gastrointestinal: Negative for nausea and vomiting.  Genitourinary: Negative for dysuria.  Musculoskeletal: Negative for joint swelling.  Skin: Negative for rash.  Allergic/Immunologic: Positive for environmental allergies. Negative for food allergies and immunocompromised state.  Neurological: Positive for headaches.  Hematological: Does not bruise/bleed easily.  Psychiatric/Behavioral: Negative for dysphoric mood. The patient is not nervous/anxious.         Objective:   Physical Exam Vitals:   04/09/18 1425  BP: 110/86  Pulse: 87  SpO2: 98%  Weight: 244 lb (110.7 kg)  Height: _0  (1.778 m)   Gen: Pleasant, well-nourished, mild resp distress,  normal affect  ENT: No lesions,  mouth clear,  oropharynx clear but crowded significant PND and nasal obstruction  Neck: No JVD, no stridor   Lungs: No use of accessory muscles, severe wheeze all fields.   Cardiovascular: RRR, heart  sounds normal, no murmur or gallops, no peripheral edema  Musculoskeletal: No deformities, no cyanosis or clubbing  Neuro: alert, non focal  Skin: Warm, no lesions or rash     Assessment & Plan:  Eosinophilia His counts have decreased on most recent CBC.  He has not had a bone marrow biopsy yet.  Cytogenetics and other testing have all been reassuring.  Asthma Moderate persistent asthma.  I have not  found any evidence for ABPA, Churg-Strauss, etc.  Based on conversation today I question whether there may also be some upper airway component.  He has episodes where he feels like air will move and is blocked in his upper chest.  I believe at this time we need to keep him on the prednisone while we give him a chance to see if there is any efficacy to be had from the [place].  Continue to control exacerbating factors including GERD .  If we are not making any headway then we may need to consider other possible diagnoses including vocal dysfunction, consider referral to a tertiary center for a second opinion.  We will continue prednisone 20 mg daily. Continue to get your Nucala injections monthly. Continue Dulera 2 puffs twice a day.  Rinse and gargle after using. Follow with Dr Lamonte Sakai in 2 months or sooner if you have any problems.  Allergic rhinitis  Continue Singulair 10 mg each evening. Continue Zyrtec 10 mg once daily.  GERD (gastroesophageal reflux disease) Continue your Nexium once daily.  Baltazar Apo, MD, PhD 04/09/2018, 2:59 PM [facility] [phone] or if no answer [phone]

## 2018-04-09 NOTE — Progress Notes (Signed)
Jose Ross, [place] [address]   CLINIC:  Medical Oncology/Hematology  PCP:  Patient, No Pcp Per No address on file None   REASON FOR VISIT: Follow-up for esoinophila  CURRENT THERAPY: work-up   INTERVAL HISTORY:  Jose Ross 47 y.o. male returns for routine follow-up for eosinophilia. Patient is here today with his girlfriend. Patient has SOB and cough. It comes and goes. He has problem even walking upstairs in his house due to SOB. His rash has improved however he still gets it from time to time.  His appetite and energy level is 100%. He denies any new pains. Denies any nausea, vomiting, or diarrhea.     REVIEW OF SYSTEMS:  Review of Systems  Respiratory: Positive for cough and shortness of breath.   Neurological: Positive for dizziness.  All other systems reviewed and are negative.    PAST MEDICAL/SURGICAL HISTORY:  History reviewed. No pertinent past medical history. History reviewed. No pertinent surgical history.   SOCIAL HISTORY:  Social History   Socioeconomic History  . Marital status: Single    Spouse name: Not on file  . Number of children: 2  . Years of education: Not on file  . Highest education level: Not on file  Occupational History  . Not on file  Social Needs  . Financial resource strain: Not hard at all  . Food insecurity:    Worry: Never true    Inability: Never true  . Transportation needs:    Medical: No    Non-medical: No  Tobacco Use  . Smoking status: Former Smoker    Packs/day: 0.25    Years: 10.00    Pack years: 2.50    Types: Cigarettes    Last attempt to quit: 2000    Years since quitting: 19.7  . Smokeless tobacco: Never Used  Substance and Sexual Activity  . Alcohol use: Yes    Comment: 4-5 drinks a week  . Drug use: Not Currently  . Sexual activity: Not on file  Lifestyle  . Physical activity:    Days per week: 3 days    Minutes per session: 30 min  . Stress: Only a little    Relationships  . Social connections:    Talks on phone: More than three times a week    Gets together: Three times a week    Attends religious service: Never    Active member of club or organization: No    Attends meetings of clubs or organizations: Never    Relationship status: Divorced  . Intimate partner violence:    Fear of current or ex partner: No    Emotionally abused: No    Physically abused: No    Forced sexual activity: No  Other Topics Concern  . Not on file  Social History Narrative  . Not on file    FAMILY HISTORY:  Family History  Problem Relation Age of Onset  . Asthma Mother   . Skin cancer Mother     CURRENT MEDICATIONS:  Outpatient Encounter Medications as of 04/09/2018  Medication Sig  . albuterol (PROVENTIL HFA;VENTOLIN HFA) 108 (90 Base) MCG/ACT inhaler Inhale 2 puffs into the lungs every 4 (four) hours as needed for wheezing or shortness of breath (cough, shortness of breath or wheezing.).  Marland Kitchen albuterol (PROVENTIL) (2.5 MG/3ML) 0.083% nebulizer solution Take 3 mLs (2.5 mg total) by nebulization every 4 (four) hours as needed for wheezing or shortness of breath.  . benzonatate (TESSALON)  200 MG capsule TAKE ONE CAPSULE BY MOUTH THREE TIMES DAILY AS NEEDED FOR COUGH  . EPINEPHrine 0.3 mg/0.3 mL IJ SOAJ injection Inject one dose intramuscularly for allergic reaction. May repeat one dose if needed after 5-15 minutes. Proceed to the ER  . mometasone-formoterol (DULERA) 100-5 MCG/ACT AERO Inhale 2 puffs into the lungs 2 (two) times daily.  . montelukast (SINGULAIR) 10 MG tablet Take 1 tablet (10 mg total) by mouth at bedtime.  Marland Kitchen NUCALA 100 MG/ML SOSY INJECT '100MG'$  SUBCUTANEOUSLY EVERY 4 WEEKS (GIVEN AT PRESCRIBERS OFFICE)  . predniSONE (DELTASONE) 20 MG tablet Take 1 tablet (20 mg total) by mouth daily with breakfast.  . SPIRIVA RESPIMAT 1.25 MCG/ACT AERS   . [DISCONTINUED] Mepolizumab (NUCALA [place]) Inject 1 mg into the skin every 28 (twenty-eight) days.     Facility-Administered Encounter Medications as of 04/09/2018  Medication  . Mepolizumab SOLR 100 mg    ALLERGIES:  No Known Allergies   PHYSICAL EXAM:  ECOG Performance status: 1  Vitals:   04/09/18 1119  BP: (!) 137/92  Pulse: 86  Resp: (!) 22  SpO2: 97%   Filed Weights   04/09/18 1119  Weight: 244 lb 3.2 oz (110.8 kg)    Physical Exam  Constitutional: He is oriented to person, place, and time. He appears well-developed and well-nourished.  Musculoskeletal: Normal range of motion.  Neurological: He is alert and oriented to person, place, and time.  Skin: Skin is warm and dry.  Psychiatric: He has a normal mood and affect. His behavior is normal. Judgment and thought content normal.     LABORATORY DATA:  I have reviewed the labs as listed.  CBC    Component Value Date/Time   WBC 12.0 (H) 04/09/2018 1235   RBC 5.15 04/09/2018 1235   HGB 16.9 04/09/2018 1235   HCT 49.4 04/09/2018 1235   PLT 197 04/09/2018 1235   MCV 95.9 04/09/2018 1235   MCH 32.8 04/09/2018 1235   MCHC 34.2 04/09/2018 1235   RDW 13.4 04/09/2018 1235   LYMPHSABS 2.1 04/09/2018 1235   MONOABS 0.7 04/09/2018 1235   EOSABS 0.6 04/09/2018 1235   BASOSABS 0.1 04/09/2018 1235   CMP Latest Ref Rng & Units 03/05/2018 12/04/2017  Glucose 70 - 99 mg/dL 95 98  BUN 6 - 20 mg/dL 24(H) 17  Creatinine 0.61 - 1.24 mg/dL 1.28(H) 1.29  Sodium 135 - 145 mmol/L 140 139  Potassium 3.5 - 5.1 mmol/L 4.9 4.6  Chloride 98 - 111 mmol/L 102 103  CO2 22 - 32 mmol/L 31 28  Calcium 8.9 - 10.3 mg/dL 9.0 9.7  Total Protein 6.5 - 8.1 g/dL 6.6 7.6  Total Bilirubin 0.3 - 1.2 mg/dL 0.7 0.4  Alkaline Phos 38 - 126 U/L 94 114  AST 15 - 41 U/L 15 13  ALT 0 - 44 U/L 24 14       DIAGNOSTIC IMAGING:  Have independently reviewed the results of the CT scan of the abdomen dated 03/20/2018 which shows spleen is in the upper limits of normal.  I have also reviewed his echocardiogram results discussed with  him.     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  Most recent absolute eosinophil count was 1400 on 01/06/2018.  Etiology includes reactive versus clonal. -Elevated IgE level on 2 occasions of 850 and 588. -Dr. Ernst Bowler has sent assays for FIP1L1 and PDGFRA fusion.  Strongyloidiasis antibody was negative.  LDH was  in the upper limit of normal. - I have reviewed his high-resolution CT scan of the chest which did not reveal any pulmonary involvement.  He does not have any gastrointestinal symptoms. - FGFR1 and PDGFR beta on peripheral blood was negative.  I could not find the results of PDGFR A and FIP1L1.  I will send them today.  We will also send BCR/ABL by FISH. - CT scan of the abdomen on 03/20/2018 shows spleen size in the upper limits of normal.  Echocardiogram shows ejection fraction of 55 to 60% with mild thickening of the leaflets.  He had normal tryptase levels and B12 was normal. -I have reviewed his CBC with differential today.  Eosinophil count is normal.  He has mildly elevated white count secondary to prednisone. - We have delayed his bone marrow biopsy as his eosinophil count has normalized in the last 2 occasions. -We will follow-up with him in 1 to 2 weeks to discuss the results of the above.    2.  Moderate persistent asthma: - He  is on prednisone 20 mg daily.  He is also continuing Nucala injections monthly.  He also uses Dulera 2 puffs.        Orders placed this encounter:  Orders Placed This Encounter  Procedures  . CBC with Differential      Derek Jack, MD [place] [phone]

## 2018-04-09 NOTE — Assessment & Plan Note (Signed)
Moderate persistent asthma.  I have not found any evidence for ABPA, Churg-Strauss, etc.  Based on conversation today I question whether there may also be some upper airway component.  He has episodes where he feels like air will move and is blocked in his upper chest.  I believe at this time we need to keep him on the prednisone while we give him a chance to see if there is any efficacy to be had from the Nucala.  Continue to control exacerbating factors including GERD .  If we are not making any headway then we may need to consider other possible diagnoses including vocal dysfunction, consider referral to a tertiary center for a second opinion.  We will continue prednisone 20 mg daily. Continue to get your Nucala injections monthly. Continue Dulera 2 puffs twice a day.  Rinse and gargle after using. Follow with Dr Delton Coombes in 2 months or sooner if you have any problems.

## 2018-04-09 NOTE — Assessment & Plan Note (Signed)
  Continue Singulair 10 mg each evening. Continue Zyrtec 10 mg once daily.

## 2018-04-09 NOTE — Assessment & Plan Note (Addendum)
1.  Eosinophilia: - Elevated absolute eosinophil count of 5800 recorded on 12/04/2017.  Most recent absolute eosinophil count was 1400 on 01/06/2018.  Etiology includes reactive versus clonal. -Elevated IgE level on 2 occasions of 850 and 588. -Dr. Ernst Bowler has sent assays for FIP1L1 and PDGFRA fusion.  Strongyloidiasis antibody was negative.  LDH was in the upper limit of normal. - I have reviewed his high-resolution CT scan of the chest which did not reveal any pulmonary involvement.  He does not have any gastrointestinal symptoms. - FGFR1 and PDGFR beta on peripheral blood was negative.  I could not find the results of PDGFR A and FIP1L1.  I will send them today.  We will also send BCR/ABL by FISH. - CT scan of the abdomen on 03/20/2018 shows spleen size in the upper limits of normal.  Echocardiogram shows ejection fraction of 55 to 60% with mild thickening of the leaflets.  He had normal tryptase levels and B12 was normal. -I have reviewed his CBC with differential today.  Eosinophil count is normal.  He has mildly elevated white count secondary to prednisone. - We have delayed his bone marrow biopsy as his eosinophil count has normalized in the last 2 occasions. -We will follow-up with him in 1 to 2 weeks to discuss the results of the above.    2.  Moderate persistent asthma: - He  is on prednisone 20 mg daily.  He is also continuing Nucala injections monthly.  He also uses Dulera 2 puffs.

## 2018-04-09 NOTE — Assessment & Plan Note (Signed)
His counts have decreased on most recent CBC.  He has not had a bone marrow biopsy yet.  Cytogenetics and other testing have all been reassuring.

## 2018-04-09 NOTE — Assessment & Plan Note (Signed)
Continue your Nexium once daily.  

## 2018-04-09 NOTE — Patient Instructions (Signed)
We will continue prednisone 20 mg daily. Continue to get your Nucala injections monthly. Continue Dulera 2 puffs twice a day.  Rinse and gargle after using. Continue your Nexium once daily. Continue Singulair 10 mg each evening. Continue Zyrtec 10 mg once daily. Follow with Dr Delton Coombes in 2 months or sooner if you have any problems.

## 2018-04-09 NOTE — Telephone Encounter (Signed)
1 vial Arrival Date:04/09/18 Lot #: [id number] Exp Date:09/2019

## 2018-04-09 NOTE — Patient Instructions (Signed)
[facility] at [facility] Discharge Instructions     Thank you for choosing [facility] at [facility] to provide your oncology and hematology care.  To afford each patient quality time with our provider, please arrive at least 15 minutes before your scheduled appointment time.   If you have a lab appointment with the Cancer Center please come in thru the  Main Entrance and check in at the main information desk  You need to re-schedule your appointment should you arrive 10 or more minutes late.  We strive to give you quality time with our providers, and arriving late affects you and other patients whose appointments are after yours.  Also, if you no show three or more times for appointments you may be dismissed from the clinic at the providers discretion.     Again, thank you for choosing [facility].  Our hope is that these requests will decrease the amount of time that you wait before being seen by our physicians.       _____________________________________________________________  Should you have questions after your visit to [facility], please contact our office at [phone] between the hours of 8:00 a.m. and 4:30 p.m.  Voicemails left after 4:00 p.m. will not be returned until the following business day.  For prescription refill requests, have your pharmacy contact our office and allow 72 hours.    Cancer Center Support Programs:   > Cancer Support Group  2nd Tuesday of the month 1pm-2pm, Journey Room    

## 2018-04-10 DIAGNOSIS — D721 Eosinophilia: Secondary | ICD-10-CM | POA: Diagnosis not present

## 2018-04-13 ENCOUNTER — Ambulatory Visit (INDEPENDENT_AMBULATORY_CARE_PROVIDER_SITE_OTHER): Payer: 59

## 2018-04-13 DIAGNOSIS — J4550 Severe persistent asthma, uncomplicated: Secondary | ICD-10-CM | POA: Diagnosis not present

## 2018-04-13 MED ORDER — MEPOLIZUMAB 100 MG ~~LOC~~ SOLR
100.0000 mg | Freq: Once | SUBCUTANEOUS | Status: AC
  Administered 2018-04-13: 100 mg via SUBCUTANEOUS

## 2018-04-22 ENCOUNTER — Encounter (HOSPITAL_COMMUNITY): Payer: Self-pay | Admitting: Hematology

## 2018-04-28 ENCOUNTER — Encounter (HOSPITAL_COMMUNITY): Payer: Self-pay | Admitting: Hematology

## 2018-04-28 ENCOUNTER — Other Ambulatory Visit: Payer: Self-pay

## 2018-04-28 ENCOUNTER — Inpatient Hospital Stay (HOSPITAL_COMMUNITY): Payer: 59 | Attending: Hematology | Admitting: Hematology

## 2018-04-28 VITALS — BP 127/105 | HR 72 | Resp 20 | Wt 247.5 lb

## 2018-04-28 DIAGNOSIS — Z87891 Personal history of nicotine dependence: Secondary | ICD-10-CM | POA: Diagnosis not present

## 2018-04-28 DIAGNOSIS — Z79899 Other long term (current) drug therapy: Secondary | ICD-10-CM | POA: Diagnosis not present

## 2018-04-28 DIAGNOSIS — D721 Eosinophilia, unspecified: Secondary | ICD-10-CM

## 2018-04-28 DIAGNOSIS — J4540 Moderate persistent asthma, uncomplicated: Secondary | ICD-10-CM

## 2018-04-28 DIAGNOSIS — R05 Cough: Secondary | ICD-10-CM | POA: Diagnosis not present

## 2018-04-28 NOTE — Progress Notes (Signed)
Jose Ross, [place] [address]   CLINIC:  Medical Oncology/Hematology  PCP:  Patient, No Pcp Per No address on file None   REASON FOR VISIT: Follow-up for esoinophila  CURRENT THERAPY: observation   INTERVAL HISTORY:  Jose Ross 47 y.o. male returns for routine follow-up for esoinophila. Patient is here today with family. He reports his breathing has improved. He is still needing his inhalers to make it through the day however he is able to do more activities and use them less often. He recently had a sinus infection which makes his breathing worse but he has finished his antibiotics and his doing much better. He still has a cough remaining from the infection. He denies any new pains. Denies any skin rashes. He reports his appetite and energy level at 100%.     REVIEW OF SYSTEMS:  Review of Systems  Respiratory: Positive for cough and shortness of breath.   All other systems reviewed and are negative.    PAST MEDICAL/SURGICAL HISTORY:  History reviewed. No pertinent past medical history. History reviewed. No pertinent surgical history.   SOCIAL HISTORY:  Social History   Socioeconomic History  . Marital status: Single    Spouse name: Not on file  . Number of children: 2  . Years of education: Not on file  . Highest education level: Not on file  Occupational History  . Not on file  Social Needs  . Financial resource strain: Not hard at all  . Food insecurity:    Worry: Never true    Inability: Never true  . Transportation needs:    Medical: No    Non-medical: No  Tobacco Use  . Smoking status: Former Smoker    Packs/day: 0.25    Years: 10.00    Pack years: 2.50    Types: Cigarettes    Last attempt to quit: 2000    Years since quitting: 19.8  . Smokeless tobacco: Never Used  Substance and Sexual Activity  . Alcohol use: Yes    Comment: 4-5 drinks a week  . Drug use: Not Currently  . Sexual activity: Not on file    Lifestyle  . Physical activity:    Days per week: 3 days    Minutes per session: 30 min  . Stress: Only a little  Relationships  . Social connections:    Talks on phone: More than three times a week    Gets together: Three times a week    Attends religious service: Never    Active member of club or organization: No    Attends meetings of clubs or organizations: Never    Relationship status: Divorced  . Intimate partner violence:    Fear of current or ex partner: No    Emotionally abused: No    Physically abused: No    Forced sexual activity: No  Other Topics Concern  . Not on file  Social History Narrative  . Not on file    FAMILY HISTORY:  Family History  Problem Relation Age of Onset  . Asthma Mother   . Skin cancer Mother     CURRENT MEDICATIONS:  Outpatient Encounter Medications as of 04/28/2018  Medication Sig  . albuterol (PROVENTIL HFA;VENTOLIN HFA) 108 (90 Base) MCG/ACT inhaler Inhale 2 puffs into the lungs every 4 (four) hours as needed for wheezing or shortness of breath (cough, shortness of breath or wheezing.).  Marland Kitchen albuterol (PROVENTIL) (2.5 MG/3ML) 0.083% nebulizer solution Take 3 mLs (  2.5 mg total) by nebulization every 4 (four) hours as needed for wheezing or shortness of breath.  . benzonatate (TESSALON) 200 MG capsule TAKE ONE CAPSULE BY MOUTH THREE TIMES DAILY AS NEEDED FOR COUGH  . EPINEPHrine 0.3 mg/0.3 mL IJ SOAJ injection Inject one dose intramuscularly for allergic reaction. May repeat one dose if needed after 5-15 minutes. Proceed to the ER  . mometasone-formoterol (DULERA) 100-5 MCG/ACT AERO Inhale 2 puffs into the lungs 2 (two) times daily.  . montelukast (SINGULAIR) 10 MG tablet Take 1 tablet (10 mg total) by mouth at bedtime.  . predniSONE (DELTASONE) 20 MG tablet Take 1 tablet (20 mg total) by mouth daily with breakfast.  . SPIRIVA RESPIMAT 1.25 MCG/ACT AERS    No facility-administered encounter medications on file as of 04/28/2018.      ALLERGIES:  No Known Allergies   PHYSICAL EXAM:  ECOG Performance status: 1  Vitals:   04/28/18 0925  BP: (!) 127/105  Pulse: 72  Resp: 20  SpO2: 98%   Filed Weights   04/28/18 0925  Weight: 247 lb 8 oz (112.3 kg)    Physical Exam  Constitutional: He is oriented to person, place, and time. He appears well-developed and well-nourished.  Musculoskeletal: Normal range of motion.  Neurological: He is alert and oriented to person, place, and time.  Skin: Skin is warm and dry.  Psychiatric: He has a normal mood and affect. His behavior is normal. Judgment and thought content normal.     LABORATORY DATA:  I have reviewed the labs as listed.  CBC    Component Value Date/Time   WBC 12.0 (H) 04/09/2018 1235   RBC 5.15 04/09/2018 1235   HGB 16.9 04/09/2018 1235   HCT 49.4 04/09/2018 1235   PLT 197 04/09/2018 1235   MCV 95.9 04/09/2018 1235   MCH 32.8 04/09/2018 1235   MCHC 34.2 04/09/2018 1235   RDW 13.4 04/09/2018 1235   LYMPHSABS 2.1 04/09/2018 1235   MONOABS 0.7 04/09/2018 1235   EOSABS 0.6 04/09/2018 1235   BASOSABS 0.1 04/09/2018 1235   CMP Latest Ref Rng & Units 03/05/2018 12/04/2017  Glucose 70 - 99 mg/dL 95 98  BUN 6 - 20 mg/dL 24(H) 17  Creatinine 0.61 - 1.24 mg/dL 1.28(H) 1.29  Sodium 135 - 145 mmol/L 140 139  Potassium 3.5 - 5.1 mmol/L 4.9 4.6  Chloride 98 - 111 mmol/L 102 103  CO2 22 - 32 mmol/L 31 28  Calcium 8.9 - 10.3 mg/dL 9.0 9.7  Total Protein 6.5 - 8.1 g/dL 6.6 7.6  Total Bilirubin 0.3 - 1.2 mg/dL 0.7 0.4  Alkaline Phos 38 - 126 U/L 94 114  AST 15 - 41 U/L 15 13  ALT 0 - 44 U/L 24 14       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  Most recent absolute eosinophil count was 1400 on 01/06/2018.  Etiology includes reactive versus clonal. -Elevated IgE level on 2 occasions of 850 and 588. -Dr. Ernst Bowler has sent assays for FIP1L1 and PDGFRA fusion.   Strongyloidiasis antibody was negative.  LDH was in the upper limit of normal. - I have reviewed his high-resolution CT scan of the chest which did not reveal any pulmonary involvement.  He does not have any gastrointestinal symptoms. - FGFR1 and PDGFR beta on peripheral blood was negative. - CT scan of the abdomen on 03/20/2018 shows spleen size in the upper limits of normal.  Echocardiogram shows ejection fraction of 55 to 60% with mild thickening of the leaflets.  He had normal tryptase levels and B12 was normal. -Last CBC from 04/09/2018 was normal in terms of differential.  White count was mildly elevated secondary to steroids. -We discussed the results of PDGFR A and FIP1L1 which were negative.  Jak 2 mutation was also negative. - At this time it does not appear like he has any primary eosinophilic disorders.  I think his elevated eosinophil count on 2 occasions was related to his respiratory symptoms.  Hence I am not considering bone marrow biopsy at this time. -I will reevaluate him in 2 months with a repeat CBC with differential.  2.  Moderate persistent asthma: - His breathing is much better today.  He is continuing prednisone 20 mg daily.  He also takes Nucala injections monthly.  He is using Dulera 2 puffs.          Orders placed this encounter:  Orders Placed This Encounter  Procedures  . CBC with Differential/Platelet  . Comprehensive metabolic panel      Derek Jack, MD [place] [phone]

## 2018-04-28 NOTE — Patient Instructions (Signed)
[facility] at [facility] Discharge Instructions  Follow up in 2 months with labs   Thank you for choosing [facility] at [facility] to provide your oncology and hematology care.  To afford each patient quality time with our provider, please arrive at least 15 minutes before your scheduled appointment time.   If you have a lab appointment with the Cancer Center please come in thru the  Main Entrance and check in at the main information desk  You need to re-schedule your appointment should you arrive 10 or more minutes late.  We strive to give you quality time with our providers, and arriving late affects you and other patients whose appointments are after yours.  Also, if you no show three or more times for appointments you may be dismissed from the clinic at the providers discretion.     Again, thank you for choosing [facility].  Our hope is that these requests will decrease the amount of time that you wait before being seen by our physicians.       _____________________________________________________________  Should you have questions after your visit to  Cancer Center, please contact our office at [phone] between the hours of 8:00 a.m. and 4:30 p.m.  Voicemails left after 4:00 p.m. will not be returned until the following business day.  For prescription refill requests, have your pharmacy contact our office and allow 72 hours.    Cancer Center Support Programs:   > Cancer Support Group  2nd Tuesday of the month 1pm-2pm, Journey Room    

## 2018-04-28 NOTE — Assessment & Plan Note (Signed)
1.  Eosinophilia: - Elevated absolute eosinophil count of 5800 recorded on 12/04/2017.  Most recent absolute eosinophil count was 1400 on 01/06/2018.  Etiology includes reactive versus clonal. -Elevated IgE level on 2 occasions of 850 and 588. -Dr. Ernst Bowler has sent assays for FIP1L1 and PDGFRA fusion.  Strongyloidiasis antibody was negative.  LDH was in the upper limit of normal. - I have reviewed his high-resolution CT scan of the chest which did not reveal any pulmonary involvement.  He does not have any gastrointestinal symptoms. - FGFR1 and PDGFR beta on peripheral blood was negative. - CT scan of the abdomen on 03/20/2018 shows spleen size in the upper limits of normal.  Echocardiogram shows ejection fraction of 55 to 60% with mild thickening of the leaflets.  He had normal tryptase levels and B12 was normal. -Last CBC from 04/09/2018 was normal in terms of differential.  White count was mildly elevated secondary to steroids. -We discussed the results of PDGFR A and FIP1L1 which were negative.  Jak 2 mutation was also negative. - At this time it does not appear like he has any primary eosinophilic disorders.  I think his elevated eosinophil count on 2 occasions was related to his respiratory symptoms.  Hence I am not considering bone marrow biopsy at this time. -I will reevaluate him in 2 months with a repeat CBC with differential.  2.  Moderate persistent asthma: - His breathing is much better today.  He is continuing prednisone 20 mg daily.  He also takes Nucala injections monthly.  He is using Dulera 2 puffs.

## 2018-05-07 ENCOUNTER — Telehealth: Payer: Self-pay | Admitting: Emergency Medicine

## 2018-05-07 NOTE — Telephone Encounter (Signed)
Called pharmacy back, to confirm shipment. Done. Shipment will be here 05/08/18. 1 Vial Order Date: pt. Ordered 05/07/18 Shipping Date: 05/07/18 I confirmed shipment, it is coming tomorrow. Nothing further needed.

## 2018-05-08 ENCOUNTER — Telehealth: Payer: Self-pay | Admitting: Emergency Medicine

## 2018-05-08 NOTE — Telephone Encounter (Signed)
1 Vial Order Date: 05/08/18 Shipping Date: 05/11/18

## 2018-05-12 ENCOUNTER — Encounter: Payer: Self-pay | Admitting: Emergency Medicine

## 2018-05-12 ENCOUNTER — Ambulatory Visit (INDEPENDENT_AMBULATORY_CARE_PROVIDER_SITE_OTHER): Payer: 59

## 2018-05-12 ENCOUNTER — Ambulatory Visit: Payer: 59 | Admitting: Emergency Medicine

## 2018-05-12 VITALS — BP 120/80 | HR 90 | Ht 70.0 in | Wt 246.0 lb

## 2018-05-12 DIAGNOSIS — J310 Chronic rhinitis: Secondary | ICD-10-CM | POA: Diagnosis not present

## 2018-05-12 DIAGNOSIS — J4550 Severe persistent asthma, uncomplicated: Secondary | ICD-10-CM | POA: Diagnosis not present

## 2018-05-12 DIAGNOSIS — J301 Allergic rhinitis due to pollen: Secondary | ICD-10-CM

## 2018-05-12 DIAGNOSIS — D721 Eosinophilia, unspecified: Secondary | ICD-10-CM

## 2018-05-12 DIAGNOSIS — J329 Chronic sinusitis, unspecified: Secondary | ICD-10-CM

## 2018-05-12 MED ORDER — MEPOLIZUMAB 100 MG ~~LOC~~ SOLR
100.0000 mg | Freq: Once | SUBCUTANEOUS | Status: AC
  Administered 2018-05-12: 100 mg via SUBCUTANEOUS

## 2018-05-12 MED ORDER — PREDNISONE 10 MG PO TABS: 10.0000 mg | ORAL_TABLET | Freq: Every day | ORAL | 5 refills | Status: DC

## 2018-05-12 NOTE — Progress Notes (Signed)
Subjective:    Patient ID: Jose Ross, male    DOB: Oct 30, 1970, 47 y.o.   MRN: [id number]  Asthma  He complains of cough, shortness of breath and wheezing. Associated symptoms include headaches and sneezing. Pertinent negatives include no ear pain, fever, postnasal drip, rhinorrhea, sore throat or trouble swallowing. His past medical history is significant for asthma.  Shortness of Breath  Associated symptoms include headaches and wheezing. Pertinent negatives include no ear pain, fever, leg swelling, rash, rhinorrhea, sore throat or vomiting. His past medical history is significant for asthma.   47 year old former smoker (15 pk-yrs) with little PMH beyond contact dermatitis, here today for evaluation of dyspnea.  Was well until early April, began to have cough, noise with breathing / wheeze, then some evolving dyspnea w exertion. Cough was initially productive, now not. Has continued to have dyspnea, cough even at night awakening. Now has a lot of clear nasal nasal drainage. He does have some GERD sx several times a week.  He has been seen at Urgent Care and treated with azithro, pred, then again on 2 occasions. Has been using albuterol frequently. Then was treated with levaquin yesterday at another UC in [place].   ROV 12/23/17 --this follow-up visit for patient with a minimal tobacco history with dyspnea and clinical history consistent with asthma.  He has been treated for what sounds like recurrent exacerbations and has been found to have extremely high exhaled nitric oxide level, eosinophil count, IgE.  He was last seen in our office 5/31 by T Parrett. He is now on Dulera and Singulair, completed a pred taper 2 days ago. He is using albuterol about 2x a day. RAST testing > pending. His congestion and cough are better. He still has some dyspnea, chest tightness. CXR 5/23 > bronchitis changes. A CT chest is pending. His FENO today is 74 . 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  He has frequent exacerbations.  He has bony function testing on 12/29/2017 and I have reviewed.  This shows mild obstruction and a positive bronchodilator response, normal lung volumes, normal diffusion capacity.  We have managed him on Dulera, Singulair, Zyrtec, currently on flonase. He tried nasal saline - couldn't tolerate it.   We referred him for allergy evaluation, will be seen next month. He is having much more nasal gtt, associated dyspnea.  A CT scan of his chest done on 01/02/2018 was reviewed by me and shows no evidence for interstitial disease, some nonspecific scattered pulmonary nodules without a pattern consistent with opportunistic infection or atypical infection.  Some evidence for mild air trapping and small airways disease.   ROV 02/06/18 --follow-up visit for 47 year old man with asthma, eosinophilic phenotype with an elevated IgE.  He has mild obstruction with a positive bronchodilator response by spirometry.  He has been on Dulera, Singulair, Zyrtec and Flonase.  Allergy evaluation was arranged.  We have initiated the process of obtaining Nucala when you find out from time. He continues to have chest tightness, dyspnea, cough that wakes him form sleep. He does get relief from nebulized albuterol. He is supposed to see allergy later this month.   IgE 588 (01/06/2018 Eosinophil 13.2%, 1.4K per microliter (01/06/18)  ROV 03/09/18 --Mr. Jose Ross presents today for follow-up.  He is 47 years old with a minimal tobacco history (15 pack years), history of contact dermatitis.  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  Spirometry confirms mild obstruction with a positive bronchodilator response.  He has responded clinically to prednisone, benefits some from albuterol.  Evaluation has included an  unrevealing CT scan of the chest with a negative ANCA making Churg-Strauss or eosinophilic granulomatosis unlikely.  Likewise he has had negative aspergillus IgE testing effectively ruling out ABPA.  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  I sent him for allergy evaluation, has seen Dr Dellis Anes, and surprisingly his skin testing was negative.  In light of this he was referred to hematology/oncology for further evaluation of his profound eosinophilia.  No evidence for parasitic infection on serological testing (or clinically).  Genetic testing for possible hypereosinophilic syndrome, myeloproliferative disorders has been sent.  Tryptase level reassuring without any evidence for mastocytosis. He is being considered for BM biopsy depending on where the workup leads.   He does have persistent asthma, but there is now evidence that is may be secondary to some other primary eosinophilic process. I still believe it is appropriate to use Nucala, first dose was 02/12/18. He was treated with another pred taper, is currently off prednisone. When he came off anti-histamines for allergy testing he had significant patchy skin changes, is now back on zyrtec, singulair, on Dulera. He uses albuterol about 4x a day, does note a significant clinical change in breathing when he uses it. His wheeze appears to be better, still has severe exertional SOB.   ROV 04/09/18 --this is a follow-up visit for severe persistent asthma in the setting of hyper eosinophilia and an elevated IgE.  He has mild obstruction on his pulmonary function testing.  I evaluated him for possible ABPA, Churg-Strauss or eosinophilic granulomatosis without any evidence of any.  He has continued to be symptomatic even after starting on Nucala, he has had 2 doses.  His work-up for his hypereosinophilia has included cytogenetics which were normal. We started him on prednisone 20mg  last week because  he was having persistent wheeze, cough, dyspnea. He is on nexium qd. Remains on singulair, Dulera, using albuterol several times a day. On zyrtec.   ROV 05/12/18 --Mr. 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  His obstruction on PFT is mild but his symptoms are persistent and very disruptive. Allergy w/u without any specific antigens to target.  He has been unable to work.  No evidence to date of Churg-Strauss, eosinophilic granulomatosis, ABPA.  He is now on Nucala and has completed 4 doses. Since last visit he was treated w abx for a possible acute sinusitis. He reports that his breathing may be a bit better, but still freq albuterol use, freq cough and wheeze. He is on zyrtec - minimal response to nasal steroid.     Review of Systems  Constitutional: Negative for fever and unexpected weight change.  HENT: Positive for congestion, nosebleeds, sinus pressure and sneezing. Negative for dental problem, ear pain, postnasal drip, rhinorrhea, sore throat and trouble swallowing.   Eyes: Negative for redness and itching.  Respiratory: Positive for cough, chest tightness, shortness of breath and wheezing.   Cardiovascular: Negative for palpitations and leg swelling.  Gastrointestinal: Negative for nausea and vomiting.  Genitourinary: Negative for dysuria.  Musculoskeletal: Negative for joint swelling.  Skin: Negative for rash.  Allergic/Immunologic: Positive for environmental allergies. Negative for food allergies  and immunocompromised state.  Neurological: Positive for headaches.  Hematological: Does not bruise/bleed easily.  Psychiatric/Behavioral: Negative for dysphoric mood. The patient is not nervous/anxious.         Objective:   Physical Exam Vitals:   05/12/18 1145  BP: 120/80  Pulse: 90  SpO2: 95%  Weight: 246 lb (111.6 kg)  Height: 5\' 10"  (1.778 m)   Gen: Pleasant, well-nourished,  mild resp distress,  normal affect  ENT: No lesions,  mouth clear,  oropharynx clear but crowded significant PND and nasal obstruction  Neck: No JVD, no stridor   Lungs: No use of accessory muscles, severe wheeze all fields.   Cardiovascular: RRR, heart sounds normal, no murmur or gallops, no peripheral edema  Musculoskeletal: No deformities, no cyanosis or clubbing  Neuro: alert, non focal  Skin: Warm, no lesions or rash     Assessment & Plan:  Asthma Continue daily severe symptoms.  I believe we are maximally treating.  I have not detected any evidence for primary upper airway contribution (VCD) but I suppose this is a possibility.  He may need an alternative biologic but before committing to this I think he needs a second opinion at a tertiary center.  I'd like to send him to [facility] to be evaluated.  He is on prednisone we will initiate a taper that he is completed for doses of Nucala.  Please continue your inhaled medication as you have been taking them. Continue your Zyrtec once daily. Continue your Nucala injections per your usual schedule. We will decrease prednisone to 10 mg daily. We will refer you to [facility] for evaluation of complicated asthma.  Follow with Dr Delton Coombes in 3 months or sooner if you have any problems.  Chronic rhinitis We will perform a CT scan of your sinuses to evaluate for possible chronic sinusitis as a contributor to your asthma and upper airway symptoms.  Allergic rhinitis On zyrtec. Has not responded to nasal steroid (currently on systemic). No targets for immunotherapy on allergy eval   Levy Pupa, MD, PhD 05/12/2018, 12:22 PM  Pulmonary and Critical Care [phone] or if no answer [phone]

## 2018-05-12 NOTE — Assessment & Plan Note (Signed)
On zyrtec. Has not responded to nasal steroid (currently on systemic). No targets for immunotherapy on allergy eval

## 2018-05-12 NOTE — Assessment & Plan Note (Signed)
We will perform a CT scan of your sinuses to evaluate for possible chronic sinusitis as a contributor to your asthma and upper airway symptoms.

## 2018-05-12 NOTE — Assessment & Plan Note (Addendum)
Continue daily severe symptoms.  I believe we are maximally treating.  I have not detected any evidence for primary upper airway contribution (VCD) but I suppose this is a possibility.  He may need an alternative biologic but before committing to this I think he needs a second opinion at a tertiary center.  I'd like to send him to [facility] to be evaluated.  He is on prednisone we will initiate a taper that he is completed for doses of Nucala.  Please continue your inhaled medication as you have been taking them. Continue your Zyrtec once daily. Continue your Nucala injections per your usual schedule. We will decrease prednisone to 10 mg daily. We will refer you to [facility] for evaluation of complicated asthma.  Follow with Dr Delton Coombes in 3 months or sooner if you have any problems.

## 2018-05-12 NOTE — Progress Notes (Signed)
Documented by Boone Master CMA based on hand-written Nucala documentation sheet completed by [facility] CMA, who administered the medication.   Patient answered the following questions per the hand-written documentation on the Nucala Injection sheet completed by Dimas Millin CMA, who administered the medication:  Have you had a change in your insurance?  NO. Have you been in the hospital in the past 10 days?  NO. Do you have a fever or cough? YES.  Dry. Have you ever had a reaction to a past Nucala injection?  NO.

## 2018-05-12 NOTE — Telephone Encounter (Signed)
1 vial Arrival Date:05/12/18 Lot #: [id number] Exp Date:09/2019

## 2018-05-12 NOTE — Patient Instructions (Signed)
Please continue your inhaled medication as you have been taking them. Continue your Zyrtec once daily. Continue your Nucala injections per your usual schedule. We will decrease prednisone to 10 mg daily. We will perform a CT scan of your sinuses to evaluate for possible chronic sinusitis as a contributor to your asthma and upper airway symptoms. We will refer you to [facility] for evaluation of complicated asthma.  Follow with Jose Ross in 3 months or sooner if you have any problems.

## 2018-05-18 ENCOUNTER — Ambulatory Visit (INDEPENDENT_AMBULATORY_CARE_PROVIDER_SITE_OTHER)
Admission: RE | Admit: 2018-05-18 | Discharge: 2018-05-18 | Disposition: A | Discharge status: Home / Self Care | Payer: 59 | Source: Ambulatory Visit | Attending: Emergency Medicine | Admitting: Emergency Medicine

## 2018-05-18 DIAGNOSIS — J329 Chronic sinusitis, unspecified: Secondary | ICD-10-CM | POA: Diagnosis not present

## 2018-05-27 ENCOUNTER — Telehealth: Payer: Self-pay | Admitting: Emergency Medicine

## 2018-05-27 DIAGNOSIS — J329 Chronic sinusitis, unspecified: Secondary | ICD-10-CM

## 2018-05-27 DIAGNOSIS — D721 Eosinophilia, unspecified: Secondary | ICD-10-CM

## 2018-05-27 NOTE — Telephone Encounter (Signed)
Called patient unable to reach left message to give us a call back.   RB please advise, thank you.

## 2018-06-01 NOTE — Telephone Encounter (Signed)
Pt is calling back [phone]

## 2018-06-01 NOTE — Telephone Encounter (Signed)
Spoke with patient. He stated that he was [facility] by Dr. Delton CoombesByrum. He was seen by Dr. Ashley RoyaltyMatthews on 05/25/18 and she is wanting RB to refer the patient to ENT and GI. Patient is asking for these referrals to be placed with providers in [place].   I reviewed patient's note from [facility]. It appeared that Dr. Jerolyn CenterMathews had already placed the referrals but per the patient, these referrals were cancelled and he was advised to ask RB to place the referrals.   RB, please advise if you are ok with referring him to ENT and GI based off of Dr. Carey BullocksMathew's recommendations. Thanks!

## 2018-06-01 NOTE — Telephone Encounter (Signed)
OK with me to place, but you may need to look at the [facility] referrals (that were cancelled) to insure that we know their reasons for referral

## 2018-06-01 NOTE — Telephone Encounter (Signed)
Orders have been placed for pt to be referred to GI and ENT. Nothing further needed.

## 2018-06-01 NOTE — Telephone Encounter (Signed)
Attempted to call pt but unable to reach him. Left message for pt to return call. 

## 2018-06-03 ENCOUNTER — Encounter: Payer: Self-pay | Admitting: *Deleted

## 2018-06-03 ENCOUNTER — Ambulatory Visit: Payer: 59 | Admitting: Emergency Medicine

## 2018-06-04 ENCOUNTER — Telehealth: Payer: Self-pay | Admitting: *Deleted

## 2018-06-04 ENCOUNTER — Encounter: Payer: Self-pay | Admitting: Internal Medicine

## 2018-06-04 ENCOUNTER — Ambulatory Visit (INDEPENDENT_AMBULATORY_CARE_PROVIDER_SITE_OTHER): Payer: 59 | Admitting: Internal Medicine

## 2018-06-04 VITALS — BP 130/90 | HR 76 | Ht 70.0 in | Wt 254.8 lb

## 2018-06-04 DIAGNOSIS — D721 Eosinophilia, unspecified: Secondary | ICD-10-CM

## 2018-06-04 DIAGNOSIS — K219 Gastro-esophageal reflux disease without esophagitis: Secondary | ICD-10-CM

## 2018-06-04 MED ORDER — ESOMEPRAZOLE MAGNESIUM 20 MG PO CPDR: 20.0000 mg | DELAYED_RELEASE_CAPSULE | Freq: Two times a day (BID) | ORAL | 2 refills | Status: DC

## 2018-06-04 NOTE — Progress Notes (Signed)
Patient ID: Jose Ross, male   DOB: 1970-09-17, 47 y.o.   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 Ashley Royalty ([facility]) to evaluate GERD and consider eosinophilic esophagitis.  He is here alone today.  He reports that symptoms started in May and he was diagnosed with eosinophilia.  He has been seen by multiple doctors including pulmonology, hematology/oncology, [facility] and has an ENT visit scheduled.  Symptoms of eosinophilia started with a chest cold/congestion but also developing asthma with severe wheezing and dyspnea.  He has been taking prednisone for the better part of 6 months.  He is currently taking prednisone 40 mg daily.  With this he is gained about 45 pounds.  He is also using Dulera, Singulair and Nucala.  Pulmonary symptoms have improved but he has remained out of work and on disability.  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  He denies dysphagia or odynophagia.  Reflux symptoms did worsen in the last 6 months and he was started on Nexium which he is taking 20 mg daily.  For the most part this completely controls his heartburn symptom.  Occasional regurgitation/breakthrough symptoms.  No abdominal pain.  No change in bowel habit.  No diarrhea or constipation.  No weight loss.  No early satiety.  Again no dysphagia and odynophagia.  He does notice at night when lying down fullness in his throat which leads to throat clearing and can trigger coughing episodes.  Past Medical History:  Diagnosis Date  . Asthma   . Eosinophilia   . Pulmonary nodules     History reviewed. No pertinent surgical history.  Outpatient Medications Prior to Visit  Medication Sig Dispense Refill  . albuterol (PROVENTIL HFA;VENTOLIN HFA) 108 (90 Base) MCG/ACT inhaler Inhale 2 puffs into the lungs every 4  (four) hours as needed for wheezing or shortness of breath (cough, shortness of breath or wheezing.). 1 Inhaler 5  . albuterol (PROVENTIL) (2.5 MG/3ML) 0.083% nebulizer solution Take 3 mLs (2.5 mg total) by nebulization every 4 (four) hours as needed for wheezing or shortness of breath. 150 mL 5  . benzonatate (TESSALON) 200 MG capsule TAKE ONE CAPSULE BY MOUTH THREE TIMES DAILY AS NEEDED FOR COUGH 45 capsule 1  . EPINEPHrine 0.3 mg/0.3 mL IJ SOAJ injection Inject one dose intramuscularly for allergic reaction. May repeat one dose if needed after 5-15 minutes. Proceed to the ER  11  . mometasone-formoterol (DULERA) 100-5 MCG/ACT AERO Inhale 2 puffs into the lungs 2 (two) times daily. 1 Inhaler 5  . montelukast (SINGULAIR) 10 MG tablet Take 1 tablet (10 mg total) by mouth at bedtime. 30 tablet 5  . predniSONE (DELTASONE) 10 MG tablet Take 1 tablet (10 mg total) by mouth daily with breakfast. 30 tablet 5  . SPIRIVA RESPIMAT 1.25 MCG/ACT AERS      No facility-administered medications prior to visit.     No Known Allergies  Family History  Problem Relation Age of Onset  . Asthma Mother   . Skin cancer Mother     Social History   Tobacco Use  . Smoking status: Former Smoker    Packs/day: 0.25    Years: 10.00    Pack years: 2.50    Types: Cigarettes    Last attempt to quit: 2000    Years since quitting: 19.9  .  Smokeless tobacco: Never Used  Substance Use Topics  . Alcohol use: Yes    Comment: 4-5 drinks a week  . Drug use: Not Currently    ROS: As per history of present illness, otherwise negative  BP 130/90   Pulse 76   Ht 5\' 10"  (1.778 m)   Wt 254 lb 12.8 oz (115.6 kg)   BMI 36.56 kg/m  Constitutional: Well-developed and well-nourished. No distress. HEENT: Normocephalic and atraumatic. Oropharynx is clear and moist. Conjunctivae are normal.  No scleral icterus. Neck: Neck supple. Trachea midline. Cardiovascular: Normal rate, regular rhythm and intact distal pulses. No  M/R/G Pulmonary/chest: Effort normal and end expiratory wheezing, no rales or rhonchi. Abdominal: Soft, nontender, nondistended. Bowel sounds active throughout. There are no masses palpable. No hepatosplenomegaly. Extremities: no clubbing, cyanosis, or edema Neurological: Alert and oriented to person place and time. Skin: Skin is warm and dry.  Psychiatric: Normal mood and affect. Behavior is normal.  RELEVANT LABS AND IMAGING: CBC    Component Value Date/Time   WBC 12.0 (H) 04/09/2018 1235   RBC 5.15 04/09/2018 1235   HGB 16.9 04/09/2018 1235   HCT 49.4 04/09/2018 1235   PLT 197 04/09/2018 1235   MCV 95.9 04/09/2018 1235   MCH 32.8 04/09/2018 1235   MCHC 34.2 04/09/2018 1235   RDW 13.4 04/09/2018 1235   LYMPHSABS 2.1 04/09/2018 1235   MONOABS 0.7 04/09/2018 1235   EOSABS 0.6 04/09/2018 1235   BASOSABS 0.1 04/09/2018 1235    CMP     Component Value Date/Time   NA 140 03/05/2018 1532   K 4.9 03/05/2018 1532   CL 102 03/05/2018 1532   CO2 31 03/05/2018 1532   GLUCOSE 95 03/05/2018 1532   BUN 24 (H) 03/05/2018 1532   CREATININE 1.28 (H) 03/05/2018 1532   CALCIUM 9.0 03/05/2018 1532   PROT 6.6 03/05/2018 1532   ALBUMIN 3.7 03/05/2018 1532   AST 15 03/05/2018 1532   ALT 24 03/05/2018 1532   ALKPHOS 94 03/05/2018 1532   BILITOT 0.7 03/05/2018 1532   GFRNONAA >60 03/05/2018 1532   GFRAA >60 03/05/2018 1532   CT CHEST WITHOUT CONTRAST   TECHNIQUE: Multidetector CT imaging of the chest was performed following the standard protocol without intravenous contrast. High resolution imaging of the lungs, as well as inspiratory and expiratory imaging, was performed.   COMPARISON:  None.   FINDINGS: Cardiovascular: Heart size is normal. There is no significant pericardial fluid, thickening or pericardial calcification. No atherosclerotic calcifications noted in the thoracic aorta or the coronary arteries.   Mediastinum/Nodes: No pathologically enlarged mediastinal or  hilar lymph nodes. Please note that accurate exclusion of hilar adenopathy is limited on noncontrast CT scans. Esophagus is unremarkable in appearance. No axillary lymphadenopathy.   Lungs/Pleura: High-resolution images demonstrate no significant regions of ground-glass attenuation, subpleural reticulation, parenchymal banding, traction bronchiectasis or frank honeycombing. Inspiratory and expiratory imaging demonstrates mild air trapping indicative of small airways disease. No acute consolidative airspace disease. No pleural effusions. 4 mm subpleural nodule in the right middle lobe associated with the minor fissure (axial image 77 of series 3), nonspecific but statistically likely a subpleural lymph node. 4 mm subpleural nodule in the posterior aspect of the right lower lobe (axial image 85 of series 3), also favored to represent a subpleural lymph node. Tiny calcified granuloma in the apex of the right upper lobe. No other larger more suspicious appearing pulmonary nodules or masses are noted.   Upper Abdomen: Several tiny subcentimeter low-attenuation lesions  are noted in the liver, incompletely characterized on today's noncontrast CT examination, but statistically likely to represent cysts.   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 There are no aggressive appearing lytic or blastic lesions noted in the visualized portions of the skeleton.   IMPRESSION: 1. No findings to suggest interstitial lung disease. 2. A few scattered pulmonary nodules measuring 4 mm or less, nonspecific but favored to be benign. No follow-up needed if patient is low-risk (and has no known or suspected primary neoplasm). Non-contrast chest CT can be considered in 12 months if patient is high-risk. This recommendation follows the  consensus statement: Guidelines for Management of Incidental Pulmonary Nodules Detected on CT Images: From the Fleischner Society 2017; Radiology 2017; 284:228-243. 3. Mild air trapping indicative of small airways disease.     Electronically Signed   By: Trudie Reed M.D.   On: 01/02/2018 16:04   CT ABDOMEN WITH CONTRAST   TECHNIQUE: Multidetector CT imaging of the abdomen was performed using the standard protocol following bolus administration of intravenous contrast.   CONTRAST:  ISOVUE-300 IOPAMIDOL (ISOVUE-300) INJECTION 61%   COMPARISON:  None.   FINDINGS: Lower chest: 6 mm left lower lobe lung nodule is partially obscured by motion artifact, image 78/5. Previously 4 mm.   Hepatobiliary: 3 small low-density foci within both lobes of liver measure up to 6 mm. These are too small to reliably characterize. Gallbladder appears normal. No biliary dilatation.   Pancreas: Unremarkable. No pancreatic ductal dilatation or surrounding inflammatory changes.   Spleen: The spleen measures 10.5 by 6.5 by 12.6 cm (volume = 450 cm^3).   Adrenals/Urinary Tract: Normal adrenal glands.   Normal appearance of the right kidney. Left kidney cyst measures 1.2 cm. No mass or hydronephrosis identified bilaterally.   Stomach/Bowel: Small hiatal hernia. The visualized bowel loops within the abdomen are unremarkable. The appendix is visualized and appears normal.   Vascular/Lymphatic: Normal appearance of the abdominal aorta. No enlarged lymph nodes.   Other: No free fluid or fluid collections.   Musculoskeletal: No acute or significant osseous findings.   IMPRESSION: 1. No acute findings identified within the abdomen. 2. The spleen is upper limits of normal in size measuring 12.6 cm in length with a volume of 450 cc. 3. Left lower lobe pulmonary nodule, partially obscured by motion artifact measures 6 mm. Non-contrast chest CT at 3-6 months is recommended. If the nodules  are stable at time of repeat CT, then future CT at 18-24 months (from today's scan) is considered optional for low-risk patients, but is recommended for high-risk patients. This recommendation follows the consensus statement: Guidelines for Management of Incidental Pulmonary Nodules Detected on CT Images: From the Fleischner Society 2017; Radiology 2017; 284:228-243.     Electronically Signed   By: Signa Kell M.D.   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 Ashley Royalty ([facility]) to evaluate GERD and consider eosinophilic esophagitis.   1. GERD/?EoE --he does have reflux symptoms which are now mostly controlled on Nexium 20 mg daily.  He has not having esophageal pain or dysphagia which I would expect if he had eosinophilic esophagitis.  Some of his nocturnal throat clearing and cough could be uncontrolled reflux and for this reason I am going to increase his Nexium to 20 mg  twice daily before meals.  Upper endoscopy was considered but on high-dose prednisone I would not expect biopsies to be high yield to reveal eosinophilia within the esophagus, stomach, or small bowel (if it were ever present).  If reflux symptoms persist and do not respond to twice a day PPI and we will consider upper endoscopy.  Also as prednisone is weaned down if reflux symptoms worsen or he develop dysphagia then upper endoscopy would definitely be warranted to exclude eosinophilic esophagitis but also eosinophilic gastritis or enteritis.  I would expect more GI symptoms such as nausea, vomiting, weight loss, diarrhea, abdominal pain if he had eosinophilic gastritis or enteritis.  Also it is likely that some of his reflux is worsened with weight gain associated with prednisone therapy.  Return in about 2 months, sooner if needed   Cc: Dr. Ashley RoyaltyMatthews, [facility] Dr. Delton CoombesByrum

## 2018-06-04 NOTE — Telephone Encounter (Signed)
Patient's Nexium 20 mg twice daily has been denied.   PA Case ID: [id number] - Rx #: [id number] Need help? Call us at [phone]  Outcome: Denied   Please advise. Does he need to purchase over the counter? Try omeprazole? Another PPI?

## 2018-06-04 NOTE — Patient Instructions (Addendum)
We have sent the following medications to your pharmacy for you to pick up at your convenience: Nexium 20 mg twice daily before meals  Follow up with Dr Rhea BeltonPyrtle in 2 months.  If you are age 47 or older, your body mass index should be between 23-30. Your Body mass index is 36.56 kg/m. If this is out of the aforementioned range listed, please consider follow up with your Primary Care Provider.  If you are age 47 or younger, your body mass index should be between 19-25. Your Body mass index is 36.56 kg/m. If this is out of the aformentioned range listed, please consider follow up with your Primary Care Provider.

## 2018-06-06 NOTE — Telephone Encounter (Signed)
Can try omeprazole 20 mg BID-AC Or pantoprazole 20 mg BID-AC Thanks

## 2018-06-08 MED ORDER — OMEPRAZOLE 20 MG PO CPDR: 20.0000 mg | DELAYED_RELEASE_CAPSULE | Freq: Two times a day (BID) | ORAL | 1 refills | Status: AC

## 2018-06-08 NOTE — Telephone Encounter (Signed)
Called patient to let him know prescription for omeprazole 20 mg bid has been sent to his pharmacy in place of the Nexium. Left message for patient that prescription was sent but if he has questions to contact our office.

## 2018-06-16 ENCOUNTER — Telehealth: Payer: Self-pay | Admitting: Emergency Medicine

## 2018-06-16 NOTE — Telephone Encounter (Addendum)
Returned pt's call. Tried to call [employer] to call in nucala rx, I couldn't get through. Will try again.

## 2018-06-16 NOTE — Telephone Encounter (Signed)
ATC patient-no answer. Will forward message to RB and Lillia AbedLindsay to advise on dates to be out of work if okay to write letter. Thanks.

## 2018-06-17 ENCOUNTER — Telehealth: Payer: Self-pay | Admitting: Emergency Medicine

## 2018-06-17 NOTE — Telephone Encounter (Signed)
Lillia AbedLindsay, please advise on this. Thanks!

## 2018-06-17 NOTE — Telephone Encounter (Signed)
I was able to get through to [employer] today. Pt. Is overdue for his inj.. Rep was able to get his verification pushed through. Pt's Nucala  will be del. 06/18/18. Pt is coming in at 1:30. Nothing further needed.

## 2018-06-17 NOTE — Telephone Encounter (Signed)
A note was not written. We received a form his employer that we have filled out several times updating his status about not being released to work yet. This was faxed back on 06/15/18.

## 2018-06-17 NOTE — Telephone Encounter (Signed)
Called and spoke with pt letting him know that a form was filled out by RB and faxed back to his employer 06/15/18. Pt expressed understanding and asked me if he was still needing to go to [employer] to be able to get his records released to his employer, and I stated to pt that that was correct. I gave him the phone number to [employer] for him to call to see if he had to bring anything with him when he came to them.  Nothing further needed.

## 2018-06-17 NOTE — Telephone Encounter (Signed)
1 Vial Order Date: 06/17/18 Shipping Date: 06/17/18 

## 2018-06-17 NOTE — Telephone Encounter (Signed)
I believe I completed this for him at the beginning of the week - it was in my sign-folder. We will need to get Jose Ross to helkp me track it down.

## 2018-06-18 ENCOUNTER — Telehealth: Payer: Self-pay | Admitting: Emergency Medicine

## 2018-06-18 ENCOUNTER — Ambulatory Visit: Payer: 59

## 2018-06-18 NOTE — Telephone Encounter (Signed)
I just saw patient checking him and he advised that Bonita QuinLinda from [employer] was able to reach him and he will be going back over there to pay $25.00 fee.  He stated that Bonita QuinLinda told him that she is sending over a fax that has to be completed TODAY for patient's disability to continue..he states will end if not completed and sent in today.

## 2018-06-18 NOTE — Telephone Encounter (Signed)
Dr. Delton CoombesByrum is not back in the office until 06/30/18.  Attempted to call Bonita QuinLinda with [employer] but unable to reach her. Left message for Bonita QuinLinda to return call. Also called Tirsa with [employer] but unable to reach her. Left message also for Tirsa to return call.

## 2018-06-18 NOTE — Telephone Encounter (Signed)
Jose Ross is going to try to call him now and let him know but if unable to reach him, asks that we let him know this.  Jose Ross's CB is [phone] or Jose Ross at [phone].

## 2018-06-18 NOTE — Telephone Encounter (Signed)
[employer] put a hold on pt's Nucala. He benefits had not been verified. That has been done now. Rep said another order had been placed, his nucala will be here 06/19/18. I am going to the pt when it comes in. 1 Vial Order Date: 06/18/18 Shipping Date: 06/18/18

## 2018-06-18 NOTE — Telephone Encounter (Signed)
Tirsa returned call. I explained to her that Dr. 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 I explained to her that a form was filled out by Dr.Byrum and CMA Lillia AbedLindsay and was faxed to his employer 06/14/18.  Per Keane Scrapeirsa, she the form is not needed until 12/30 but due to the holidays coming up, we should try to get the form completed soon.  I stated to her that when RB returned to the office 12/17, we would have him fill out the form then so we could fax it back for pt. Tirsa expressed understanding and stated they would go ahead and fax the form before then so we would have it. Will leave encounter open until all has been able to be taken care of for pt.

## 2018-06-19 ENCOUNTER — Ambulatory Visit (INDEPENDENT_AMBULATORY_CARE_PROVIDER_SITE_OTHER): Payer: 59

## 2018-06-19 ENCOUNTER — Telehealth: Payer: Self-pay | Admitting: Emergency Medicine

## 2018-06-19 DIAGNOSIS — J4550 Severe persistent asthma, uncomplicated: Secondary | ICD-10-CM

## 2018-06-19 MED ORDER — ALBUTEROL SULFATE HFA 108 (90 BASE) MCG/ACT IN AERS: 2.0000 | INHALATION_SPRAY | RESPIRATORY_TRACT | 5 refills | Status: DC | PRN

## 2018-06-19 MED ORDER — BENRALIZUMAB 30 MG/ML ~~LOC~~ SOSY
30.0000 mg | PREFILLED_SYRINGE | Freq: Once | SUBCUTANEOUS | Status: AC
  Administered 2018-06-19: 30 mg via SUBCUTANEOUS

## 2018-06-19 NOTE — Telephone Encounter (Signed)
Spoke with pt and advised rx sent to pharmacy. Nothing further is needed.   

## 2018-06-19 NOTE — Progress Notes (Signed)
Documentation of medication administration and charges of Nucala have been completed by Dyron Kawano, CMA based on the hand written Nucala documentation sheet completed by Tammy Scott, who administered the medication.  

## 2018-06-19 NOTE — Telephone Encounter (Signed)
1 vial Arrival Date:06/19/18 Lot #: [id number] Exp Date:10/2019

## 2018-06-22 NOTE — Telephone Encounter (Signed)
Will also route this message to [place] for follow up on paperwork for patient

## 2018-06-23 NOTE — Telephone Encounter (Signed)
Please advise on this form once it has been completed. Thank you.

## 2018-06-24 ENCOUNTER — Inpatient Hospital Stay (HOSPITAL_COMMUNITY): Payer: 59 | Attending: Hematology

## 2018-06-24 DIAGNOSIS — J4540 Moderate persistent asthma, uncomplicated: Secondary | ICD-10-CM | POA: Diagnosis not present

## 2018-06-24 DIAGNOSIS — D721 Eosinophilia, unspecified: Secondary | ICD-10-CM

## 2018-06-24 DIAGNOSIS — Z87891 Personal history of nicotine dependence: Secondary | ICD-10-CM | POA: Diagnosis not present

## 2018-06-24 LAB — CBC WITH DIFFERENTIAL/PLATELET
ABS IMMATURE GRANULOCYTES: 0.18 10*3/uL — AB (ref 0.00–0.07)
BASOS ABS: 0.1 10*3/uL (ref 0.0–0.1)
Basophils Relative: 1 %
Eosinophils Absolute: 0.2 10*3/uL (ref 0.0–0.5)
Eosinophils Relative: 2 %
HCT: 51.2 % (ref 39.0–52.0)
Hemoglobin: 16.7 g/dL (ref 13.0–17.0)
Immature Granulocytes: 2 %
LYMPHS PCT: 32 %
Lymphs Abs: 3.7 10*3/uL (ref 0.7–4.0)
MCH: 31.2 pg (ref 26.0–34.0)
MCHC: 32.6 g/dL (ref 30.0–36.0)
MCV: 95.5 fL (ref 80.0–100.0)
MONO ABS: 0.9 10*3/uL (ref 0.1–1.0)
Monocytes Relative: 7 %
NEUTROS ABS: 6.8 10*3/uL (ref 1.7–7.7)
Neutrophils Relative %: 56 %
Platelets: 212 10*3/uL (ref 150–400)
RBC: 5.36 MIL/uL (ref 4.22–5.81)
RDW: 13 % (ref 11.5–15.5)
WBC: 11.8 10*3/uL — AB (ref 4.0–10.5)
nRBC: 0 % (ref 0.0–0.2)

## 2018-06-24 LAB — COMPREHENSIVE METABOLIC PANEL
ALT: 29 U/L (ref 0–44)
ANION GAP: 10 (ref 5–15)
AST: 20 U/L (ref 15–41)
Albumin: 3.8 g/dL (ref 3.5–5.0)
Alkaline Phosphatase: 84 U/L (ref 38–126)
BILIRUBIN TOTAL: 0.7 mg/dL (ref 0.3–1.2)
BUN: 17 mg/dL (ref 6–20)
CALCIUM: 9 mg/dL (ref 8.9–10.3)
CO2: 26 mmol/L (ref 22–32)
Chloride: 104 mmol/L (ref 98–111)
Creatinine, Ser: 1.28 mg/dL — ABNORMAL HIGH (ref 0.61–1.24)
GFR calc Af Amer: >60 mL/min (ref >60)
GFR calc non Af Amer: >60 mL/min (ref >60)
Glucose, Bld: 100 mg/dL — ABNORMAL HIGH (ref 70–99)
Potassium: 3.6 mmol/L (ref 3.5–5.1)
Sodium: 140 mmol/L (ref 135–145)
TOTAL PROTEIN: 7 g/dL (ref 6.5–8.1)

## 2018-06-26 NOTE — Telephone Encounter (Signed)
Call made to [employer], spoke with Tirsa, informed Keane Scrapeirsa we never received the form, she stated she would re-fax. Fax number confirmed. Form is not due until the 30th and she is aware Byrum will not be back until the 17th. Will await fax.

## 2018-07-01 ENCOUNTER — Inpatient Hospital Stay (HOSPITAL_BASED_OUTPATIENT_CLINIC_OR_DEPARTMENT_OTHER): Payer: 59 | Admitting: Hematology

## 2018-07-01 ENCOUNTER — Encounter (HOSPITAL_COMMUNITY): Payer: Self-pay | Admitting: Hematology

## 2018-07-01 VITALS — BP 144/97 | HR 95 | Resp 18 | Wt 258.0 lb

## 2018-07-01 DIAGNOSIS — R0602 Shortness of breath: Secondary | ICD-10-CM | POA: Diagnosis not present

## 2018-07-01 DIAGNOSIS — J4540 Moderate persistent asthma, uncomplicated: Secondary | ICD-10-CM | POA: Diagnosis not present

## 2018-07-01 DIAGNOSIS — Z87891 Personal history of nicotine dependence: Secondary | ICD-10-CM | POA: Diagnosis not present

## 2018-07-01 DIAGNOSIS — D721 Eosinophilia, unspecified: Secondary | ICD-10-CM

## 2018-07-01 NOTE — Assessment & Plan Note (Signed)
1.  Eosinophilia: - Elevated absolute eosinophil count of 5800 recorded on 12/04/2017.  Most recent dated absolute eosinophil count was 1400 on 01/06/2018.   -Elevated IgE level on 2 occasions of 850 and 588. - I have reviewed his high-resolution CT scan of the chest which did not reveal any pulmonary involvement.  He does not have any gastrointestinal symptoms. - FGFR1 and PDGFR beta on peripheral blood was negative.  Strongyloides antibody was negative.  LDH was in the upper limit of normal. - CT scan of the abdomen on 03/20/2018 shows spleen size in the upper limits of normal.  Echocardiogram shows ejection fraction of 55 to 60% with mild thickening of the leaflets.  He had normal tryptase levels and B12 was normal. - PDGFR A and FIP1L1 was negative.  Jak 2 mutation was also negative. - As there was no convincing evidence of primary eosinophilic disorders, I did not recommend bone marrow aspiration and biopsy. - His eosinophil count has been normal in the last 4 months.  He has mildly elevated white count secondary to prednisone use. -He is currently on prednisone 20 mg daily and has gained about 40 pounds.  He was also evaluated by [facility]. 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  His Nexium was increased to twice a day.  They thought doing EGD and biopsy will be low yield given patient is on prednisone. -I will see him back in 4 months for follow-up.  If his eosinophils continue to be normal, I might consider discharging him from the clinic.  2.  Moderate persistent asthma: - He reports that his breathing is better as long as he takes prednisone.  He is also continuing Nucala injections monthly.  He is using Dulera 2 puffs.     

## 2018-07-01 NOTE — Telephone Encounter (Signed)
Bonita QuinLinda returned my call. Per linda, they have not received pt's disability paperwork back from our office. I stated to Bonita QuinLinda that I would try to see if I could find the paperwork and would call her back with an update.   Looked in [employer]'s folders as well as in his office and I could not find the forms.  I did locate the forms in media tab in pt's chart. Printed form and had RB sign form. Called Bonita QuinLinda letting her know I found a form in the media tab and asked her if this was the correct form.  Per Bonita QuinLinda, this was the correct form. Bonita QuinLinda stated to me to fax it in her attn to her fax number of [phone].  I did fax pt's forms and received a confirmation that the fax went through. Nothing further needed.

## 2018-07-01 NOTE — Telephone Encounter (Signed)
I filled out the paperwork, remember signing it. We will have to find the form, a copy of it. If unable then I will need another copy.

## 2018-07-01 NOTE — Progress Notes (Signed)
[place], [place] [address]   CLINIC:  Medical Oncology/Hematology  PCP:  Patient, No Pcp Per No address on file None   REASON FOR VISIT: Follow-up for esoinophila  CURRENT THERAPY: Observation   INTERVAL HISTORY:  Jose Ross 47 y.o. male returns for routine follow-up for esoinophila. He is here with his wife. He saw an allergy doctor at [facility] and he put him on prednisone. This has helped him the most so far. He is slowly tapering them down and he feels his breathing getting slightly worse. His eye water constantly. He feels it is from one of the new medications he is taking. Denies any mouth sores. Denies any skin rash. Denies any new cough or hemoptysis. He reports his appetite at 100% and his energy level at 75%.     REVIEW OF SYSTEMS:  Review of Systems  Respiratory: Positive for shortness of breath.   All other systems reviewed and are negative.    PAST MEDICAL/SURGICAL HISTORY:  Past Medical History:  Diagnosis Date  . Asthma   . Eosinophilia   . Pulmonary nodules    History reviewed. No pertinent surgical history.   SOCIAL HISTORY:  Social History   Socioeconomic History  . Marital status: Single    Spouse name: Not on file  . Number of children: 2  . Years of education: Not on file  . Highest education level: Not on file  Occupational History  . Not on file  Social Needs  . Financial resource strain: Not hard at all  . Food insecurity:    Worry: Never true    Inability: Never true  . Transportation needs:    Medical: No    Non-medical: No  Tobacco Use  . Smoking status: Former Smoker    Packs/day: 0.25    Years: 10.00    Pack years: 2.50    Types: Cigarettes    Last attempt to quit: 2000    Years since quitting: 19.9  . Smokeless tobacco: Never Used  Substance and Sexual Activity  . Alcohol use: Yes    Comment: 4-5 drinks a week  . Drug use: Not Currently  . Sexual activity: Not on file  Lifestyle    . Physical activity:    Days per week: 3 days    Minutes per session: 30 min  . Stress: Only a little  Relationships  . Social connections:    Talks on phone: More than three times a week    Gets together: Three times a week    Attends religious service: Never    Active member of club or organization: No    Attends meetings of clubs or organizations: Never    Relationship status: Divorced  . Intimate partner violence:    Fear of current or ex partner: No    Emotionally abused: No    Physically abused: No    Forced sexual activity: No  Other Topics Concern  . Not on file  Social History Narrative  . Not on file    FAMILY HISTORY:  Family History  Problem Relation Age of Onset  . Asthma Mother   . Skin cancer Mother     CURRENT MEDICATIONS:  Outpatient Encounter Medications as of 07/01/2018  Medication Sig  . [DISCONTINUED] albuterol (PROVENTIL) (2.5 MG/3ML) 0.083% nebulizer solution Inhale into the lungs.  . [DISCONTINUED] albuterol (VENTOLIN HFA) 108 (90 Base) MCG/ACT inhaler   . [DISCONTINUED] amoxicillin-clavulanate (AUGMENTIN) 500-125 MG tablet Take by mouth.  Marland Kitchen  albuterol (PROVENTIL HFA;VENTOLIN HFA) 108 (90 Base) MCG/ACT inhaler Inhale 2 puffs into the lungs every 4 (four) hours as needed for wheezing or shortness of breath (cough, shortness of breath or wheezing.).  Marland Kitchen albuterol (PROVENTIL) (2.5 MG/3ML) 0.083% nebulizer solution Take 3 mLs (2.5 mg total) by nebulization every 4 (four) hours as needed for wheezing or shortness of breath.  Marland Kitchen amoxicillin-clavulanate (AUGMENTIN) 500-125 MG tablet Take 1 tablet by mouth 2 (two) times daily with a meal.  . benzonatate (TESSALON) 200 MG capsule TAKE ONE CAPSULE BY MOUTH THREE TIMES DAILY AS NEEDED FOR COUGH  . EPINEPHrine 0.3 mg/0.3 mL IJ SOAJ injection Inject one dose intramuscularly for allergic reaction. May repeat one dose if needed after 5-15 minutes. Proceed to the ER  . Mepolizumab 100 MG/ML SOAJ Inject into the skin.   . mometasone-formoterol (DULERA) 100-5 MCG/ACT AERO Inhale 2 puffs into the lungs 2 (two) times daily.  . montelukast (SINGULAIR) 10 MG tablet Take 1 tablet (10 mg total) by mouth at bedtime.  Marland Kitchen omeprazole (PRILOSEC) 20 MG capsule Take 1 capsule (20 mg total) by mouth 2 (two) times daily before a meal.  . predniSONE (DELTASONE) 10 MG tablet Take 1 tablet (10 mg total) by mouth daily with breakfast.  . SPIRIVA RESPIMAT 1.25 MCG/ACT AERS    No facility-administered encounter medications on file as of 07/01/2018.     ALLERGIES:  No Known Allergies   PHYSICAL EXAM:  ECOG Performance status: 1  Vitals:   07/01/18 0758  BP: (!) 144/97  Pulse: 95  Resp: 18  SpO2: 95%   Filed Weights   07/01/18 0758  Weight: 258 lb (117 kg)    Physical Exam Constitutional:      Appearance: Normal appearance. He is normal weight.  Musculoskeletal: Normal range of motion.  Skin:    General: Skin is warm and dry.  Neurological:     Mental Status: He is alert and oriented to person, place, and time. Mental status is at baseline.  Psychiatric:        Mood and Affect: Mood normal.        Behavior: Behavior normal.        Thought Content: Thought content normal.        Judgment: Judgment normal.      LABORATORY DATA:  I have reviewed the labs as listed.  CBC    Component Value Date/Time   WBC 11.8 (H) 06/24/2018 1301   RBC 5.36 06/24/2018 1301   HGB 16.7 06/24/2018 1301   HCT 51.2 06/24/2018 1301   PLT 212 06/24/2018 1301   MCV 95.5 06/24/2018 1301   MCH 31.2 06/24/2018 1301   MCHC 32.6 06/24/2018 1301   RDW 13.0 06/24/2018 1301   LYMPHSABS 3.7 06/24/2018 1301   MONOABS 0.9 06/24/2018 1301   EOSABS 0.2 06/24/2018 1301   BASOSABS 0.1 06/24/2018 1301   CMP Latest Ref Rng & Units 06/24/2018 03/05/2018 12/04/2017  Glucose 70 - 99 mg/dL 100(H) 95 98  BUN 6 - 20 mg/dL 17 24(H) 17  Creatinine 0.61 - 1.24 mg/dL 1.28(H) 1.28(H) 1.29  Sodium 135 - 145 mmol/L 140 140 139  Potassium 3.5 -  5.1 mmol/L 3.6 4.9 4.6  Chloride 98 - 111 mmol/L 104 102 103  CO2 22 - 32 mmol/L '26 31 28  '$ Calcium 8.9 - 10.3 mg/dL 9.0 9.0 9.7  Total Protein 6.5 - 8.1 g/dL 7.0 6.6 7.6  Total Bilirubin 0.3 - 1.2 mg/dL 0.7 0.7 0.4  Alkaline Phos 38 - 126  U/L 84 94 114  AST 15 - 41 U/L '20 15 13  '$ ALT 0 - 44 U/L '29 24 14       '$ DIAGNOSTIC IMAGING:  I have independently reviewed the scans and discussed with the patient.   I have reviewed Francene Finders, NP's note and agree with the documentation.  I personally performed a face-to-face visit, made revisions and my assessment and plan is as follows.    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  Most recent dated absolute eosinophil count was 1400 on 01/06/2018.   -Elevated IgE level on 2 occasions of 850 and 588. - I have reviewed his high-resolution CT scan of the chest which did not reveal any pulmonary involvement.  He does not have any gastrointestinal symptoms. - FGFR1 and PDGFR beta on peripheral blood was negative.  Strongyloides antibody was negative.  LDH was in the upper limit of normal. - CT scan of the abdomen on 03/20/2018 shows spleen size in the upper limits of normal.  Echocardiogram shows ejection fraction of 55 to 60% with mild thickening of the leaflets.  He had normal tryptase levels and B12 was normal. - PDGFR A and FIP1L1 was negative.  Jak 2 mutation was also negative. - As there was no convincing evidence of primary eosinophilic disorders, I did not recommend bone marrow aspiration and biopsy. - His eosinophil count has been normal in the last 4 months.  He has mildly elevated white count secondary to prednisone use. -He is currently on prednisone 20 mg daily and has gained about 40 pounds.  He was also evaluated by [facility] allergy clinic. 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  His Nexium was increased to twice a day.  They thought doing  EGD and biopsy will be low yield given patient is on prednisone. -I will see him back in 4 months for follow-up.  If his eosinophils continue to be normal, I might consider discharging him from the clinic.  2.  Moderate persistent asthma: - He reports that his breathing is better as long as he takes prednisone.  He is also continuing Nucala injections monthly.  He is using Dulera 2 puffs.          Orders placed this encounter:  Orders Placed This Encounter  Procedures  . CBC with Differential/Platelet  . Comprehensive metabolic panel      Derek Jack, MD [place] [phone]

## 2018-07-01 NOTE — Telephone Encounter (Signed)
Attempted to call [employer] to speak with Bonita QuinLinda but unable to reach her. Left message for Bonita QuinLinda to return call so that way we can see if all was taken care of in regards to pt's disability paperwork.

## 2018-07-01 NOTE — Telephone Encounter (Signed)
Dr. Delton CoombesByrum, please advise if you have filled out the form for pt. Thanks!

## 2018-07-01 NOTE — Patient Instructions (Signed)
[facility] at [facility] Discharge Instructions     Thank you for choosing [facility] at [facility] to provide your oncology and hematology care.  To afford each patient quality time with our provider, please arrive at least 15 minutes before your scheduled appointment time.   If you have a lab appointment with the Cancer Center please come in thru the  Main Entrance and check in at the main information desk  You need to re-schedule your appointment should you arrive 10 or more minutes late.  We strive to give you quality time with our providers, and arriving late affects you and other patients whose appointments are after yours.  Also, if you no show three or more times for appointments you may be dismissed from the clinic at the providers discretion.     Again, thank you for choosing [facility].  Our hope is that these requests will decrease the amount of time that you wait before being seen by our physicians.       _____________________________________________________________  Should you have questions after your visit to [facility], please contact our office at [phone] between the hours of 8:00 a.m. and 4:30 p.m.  Voicemails left after 4:00 p.m. will not be returned until the following business day.  For prescription refill requests, have your pharmacy contact our office and allow 72 hours.    Cancer Center Support Programs:   > Cancer Support Group  2nd Tuesday of the month 1pm-2pm, Journey Room    

## 2018-07-03 ENCOUNTER — Telehealth: Payer: Self-pay | Admitting: Emergency Medicine

## 2018-07-09 NOTE — Telephone Encounter (Signed)
RB is out of the office until 07/13/2018, will f/u at this time.

## 2018-07-10 ENCOUNTER — Telehealth: Payer: Self-pay | Admitting: Emergency Medicine

## 2018-07-10 NOTE — Telephone Encounter (Signed)
PFS: 1 Order Date: 07/10/18 Ship Date: 07/14/18

## 2018-07-13 ENCOUNTER — Ambulatory Visit (INDEPENDENT_AMBULATORY_CARE_PROVIDER_SITE_OTHER): Payer: 59 | Admitting: Emergency Medicine

## 2018-07-13 ENCOUNTER — Encounter: Payer: Self-pay | Admitting: Emergency Medicine

## 2018-07-13 DIAGNOSIS — J4550 Severe persistent asthma, uncomplicated: Secondary | ICD-10-CM | POA: Diagnosis not present

## 2018-07-13 NOTE — Telephone Encounter (Signed)
Dr. Delton CoombesByrum, please advise if you have received this from [employer] that needs to be completed. Thanks!

## 2018-07-13 NOTE — Progress Notes (Signed)
Subjective:    Patient ID: Jose SanfilippoFrederick Ross, male    DOB: 08/12/1970, 47 y.o.   MRN: [id number]  Asthma  He complains of cough, shortness of breath and wheezing. Associated symptoms include headaches and sneezing. Pertinent negatives include no ear pain, fever, postnasal drip, rhinorrhea, sore throat or trouble swallowing. His past medical history is significant for asthma.  Shortness of Breath  Associated symptoms include headaches and wheezing. Pertinent negatives include no ear pain, fever, leg swelling, rash, rhinorrhea, sore throat or vomiting. His past medical history is significant for asthma.   ROV 03/09/18 --Mr. Jose Ross presents today for follow-up.  He is 47 years old with a minimal tobacco history (15 pack years), history of contact dermatitis.  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  Spirometry confirms mild obstruction with a positive bronchodilator response.  He has responded clinically to prednisone, benefits some from albuterol.  Evaluation has included an unrevealing CT scan of the chest with a negative ANCA making Churg-Strauss or eosinophilic granulomatosis unlikely.  Likewise he has had negative aspergillus IgE testing effectively ruling out ABPA.  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  I sent him for allergy evaluation, has seen Dr Dellis AnesGallagher, and surprisingly his skin testing was negative.  In light of this he was referred to hematology/oncology for further evaluation of his profound eosinophilia.  No evidence for parasitic infection on serological testing (or clinically).  Genetic testing for possible hypereosinophilic syndrome, myeloproliferative disorders has been sent.  Tryptase level reassuring without any evidence for mastocytosis. He is being considered for BM biopsy depending on where the workup  leads.   He does have persistent asthma, but there is now evidence that is may be secondary to some other primary eosinophilic process. I still believe it is appropriate to use Nucala, first dose was 02/12/18. He was treated with another pred taper, is currently off prednisone. When he came off anti-histamines for allergy testing he had significant patchy skin changes, is now back on zyrtec, singulair, on Dulera. He uses albuterol about 4x a day, does note a significant clinical change in breathing when he uses it. His wheeze appears to be better, still has severe exertional SOB.   ROV 04/09/18 --this is a follow-up visit for severe persistent asthma in the setting of hyper eosinophilia and an elevated IgE.  He has mild obstruction on his pulmonary function testing.  I evaluated him for possible ABPA, Churg-Strauss or eosinophilic granulomatosis without any evidence of any.  He has continued to be symptomatic even after starting on Nucala, he has had 2 doses.  His work-up for his hypereosinophilia has included cytogenetics which were normal. We started him on prednisone 20mg  last week because he was having persistent wheeze, cough, dyspnea. He is on nexium qd. Remains on singulair, Dulera, using albuterol several times a day. On zyrtec.   ROV 05/12/18 --Mr. Jose Ross follows up for severe persistent asthma, hyper-eosinophilia (reassuring cytogenetic work-up, BM Bx deferred for now), elevated IgE.  His obstruction on PFT is mild but his symptoms are persistent and very disruptive. Allergy w/u without any specific antigens to target.  He has been unable to work.  No evidence to date of Churg-Strauss, eosinophilic granulomatosis, ABPA.  He is now on Nucala and has completed 4 doses. Since last visit he was treated w abx for a possible acute sinusitis. He  reports that his breathing may be a bit better, but still freq albuterol use, freq cough and wheeze. He is on zyrtec - minimal response to nasal steroid.   ROV  07/13/18 --this a follow-up visit for moderate persistent asthma with allergic rhinitis, history of dermatitis and a hypereosinophilic phenotype with an elevated IgE.  Work-up as described above.  He has required chronic steroids, is on Nucala. No side effects. He isn't sure that it is making an impact.  [facility] and his pred was increased to 40mg , then tapered. He is currently on pred 20mg . Has been seen by ENT as well, treated for chronic sinusitis, still dealing with eye drainage. Maintenance is now Trelegy. Using his albuterol 3-4x a day. [facility]. He is on short term disability - is unable to do his job as things stand.    Review of Systems  Constitutional: Negative for fever and unexpected weight change.  HENT: Positive for congestion, nosebleeds, sinus pressure and sneezing. Negative for dental problem, ear pain, postnasal drip, rhinorrhea, sore throat and trouble swallowing.   Eyes: Negative for redness and itching.  Respiratory: Positive for cough, chest tightness, shortness of breath and wheezing.   Cardiovascular: Negative for palpitations and leg swelling.  Gastrointestinal: Negative for nausea and vomiting.  Genitourinary: Negative for dysuria.  Musculoskeletal: Negative for joint swelling.  Skin: Negative for rash.  Allergic/Immunologic: Positive for environmental allergies. Negative for food allergies and immunocompromised state.  Neurological: Positive for headaches.  Hematological: Does not bruise/bleed easily.  Psychiatric/Behavioral: Negative for dysphoric mood. The patient is not nervous/anxious.         Objective:   Physical Exam Vitals:   07/13/18 1200  BP: 130/74  Pulse: 85  SpO2: 96%  Weight: 259 lb 3.2 oz (117.6 kg)  Height: 5\' 10"  (1.778 m)   Gen: Pleasant, well-nourished, mild resp distress,  normal affect, developing some cushingoid facies  ENT: No lesions,  mouth clear,  oropharynx clear but crowded   Neck: No JVD,  definite upper airway noise on expiration, especially forced expiration   Lungs: No use of accessory muscles, no wheezing currently.   Cardiovascular: RRR, heart sounds normal, no murmur or gallops, no peripheral edema  Musculoskeletal: No deformities, no cyanosis or clubbing  Neuro: alert, non focal  Skin: Warm, no lesions or rash     Assessment & Plan:  Asthma Moderate persistent asthma that has never been fully controlled even on daily prednisone.  He is currently on 20 mg daily.  There may be some overlying upper airway obstruction as I do hear some expiratory stridor in absence of wheeze today.  He has definitely had true wheezing on examination in the past and overall the presentation is consistent with asthma, eosinophilia, elevated IgE.  Unclear if he is getting much response to the Nucala but I think we should continue it.  We are attempting to aggressively treat any potential modifiable contributors.  He is going to see [facility] allergy for reevaluation (previous skin testing negative).  His GERD is treated and he has seen gastroenterology.  I have been completing forms to keep him out of work on his short-term disability while we have tried to get this process under control.  As of yet we have been unable to stabilize him to a place where he can go back to work.  I will fill out another letter for him today to keep him out until we see him again in a  month.  I asked him to start to think about what our longer range plan is going to be-we may need to start considering alternative work, long-term disability from his current job.  I support his continued follow-up at [facility].  We will continue to give him his Nucala and more frequent follow-up here.  May need to coordinate with [facility] any further work-up for possible contribution of upper airway disease.  Levy Pupa, MD, PhD 07/13/2018, 1:56 PM [facility] [phone] or if no answer [phone]

## 2018-07-13 NOTE — Telephone Encounter (Signed)
Patient was seen today by RB. He received a note to stay out of work for at least a month or until his next follow up. Could not locate [employer] forms.

## 2018-07-13 NOTE — Patient Instructions (Addendum)
Please continue Trelegy once daily.  Rinse and gargle after using. Keep albuterol available to use 2 puffs up to every 4 hours if needed for shortness of breath, chest tightness, wheezing.  Continue prednisone at 20 mg daily.  Plan for taper as per recommendations [facility]. We will continue your Nucala treatments as ordered. You will probably need to see an ophthalmologist since you are having visual changes on the prednisone. I do not believe that you can return to work given the persistence and severity of your symptoms.  We will continue to support your short-term disability.  You will need to consider whether returning is a real option or whether we need to work on long-term change, long-term disability. Follow-up with APP in 1 month Follow with Dr Delton CoombesByrum in 2 months or sooner if you have any problems.

## 2018-07-13 NOTE — Telephone Encounter (Signed)
Found the forms. RB signed the forms back on 12/20. I have the forms and [facility] to [facility] tomorrow.

## 2018-07-13 NOTE — Assessment & Plan Note (Signed)
Moderate persistent asthma that has never been fully controlled even on daily prednisone.  He is currently on 20 mg daily.  There may be some overlying upper airway obstruction as I do hear some expiratory stridor in absence of wheeze today.  He has definitely had true wheezing on examination in the past and overall the presentation is consistent with asthma, eosinophilia, elevated IgE.  Unclear if he is getting much response to the Nucala but I think we should continue it.  We are attempting to aggressively treat any potential modifiable contributors.  He is going to see [facility] allergy for reevaluation (previous skin testing negative).  His GERD is treated and he has seen gastroenterology.  I have been completing forms to keep him out of work on his short-term disability while we have tried to get this process under control.  As of yet we have been unable to stabilize him to a place where he can go back to work.  I will fill out another letter for him today to keep him out until we see him again in a month.  I asked him to start to think about what our longer range plan is going to be-we may need to start considering alternative work, long-term disability from his current job.  I [facility].  We will continue to give him his Nucala and more frequent follow-up here.  May need to coordinate with [facility] any further work-up for possible contribution of upper airway disease.

## 2018-07-14 NOTE — Telephone Encounter (Signed)
Rec'd completed form - fwd to [employer] via interoffice mail - pr  

## 2018-07-14 NOTE — Telephone Encounter (Signed)
Forms have [facility].

## 2018-07-14 NOTE — Telephone Encounter (Signed)
1 vial Arrival Date:07/14/18 Lot #: [id number] Exp Date:10/2019

## 2018-07-16 ENCOUNTER — Telehealth: Payer: Self-pay | Admitting: Emergency Medicine

## 2018-07-16 ENCOUNTER — Ambulatory Visit (INDEPENDENT_AMBULATORY_CARE_PROVIDER_SITE_OTHER): Payer: 59

## 2018-07-16 DIAGNOSIS — D721 Eosinophilia, unspecified: Secondary | ICD-10-CM

## 2018-07-16 NOTE — Telephone Encounter (Signed)
Called pt to let him know yes, his Virginia Crews is here. I made an appt. For him for 2:00 today. Nothing further needed.

## 2018-07-17 MED ORDER — MEPOLIZUMAB 100 MG ~~LOC~~ SOLR
100.0000 mg | Freq: Once | SUBCUTANEOUS | Status: AC
  Administered 2018-07-17: 100 mg via SUBCUTANEOUS

## 2018-07-17 NOTE — Progress Notes (Signed)
Documented by Jessica Jones CMA based on hand-written Nucala documentation sheet completed by Tammy Scott CMA, who administered the medication.  

## 2018-08-10 ENCOUNTER — Other Ambulatory Visit: Payer: Self-pay | Admitting: Internal Medicine

## 2018-08-13 ENCOUNTER — Ambulatory Visit: Payer: 59 | Admitting: Adult Health

## 2018-08-13 ENCOUNTER — Ambulatory Visit (INDEPENDENT_AMBULATORY_CARE_PROVIDER_SITE_OTHER): Payer: 59

## 2018-08-13 ENCOUNTER — Encounter: Payer: Self-pay | Admitting: Adult Health

## 2018-08-13 DIAGNOSIS — J4550 Severe persistent asthma, uncomplicated: Secondary | ICD-10-CM | POA: Diagnosis not present

## 2018-08-13 DIAGNOSIS — R0602 Shortness of breath: Secondary | ICD-10-CM

## 2018-08-13 DIAGNOSIS — D721 Eosinophilia, unspecified: Secondary | ICD-10-CM

## 2018-08-13 MED ORDER — MEPOLIZUMAB 100 MG ~~LOC~~ SOLR
100.0000 mg | Freq: Once | SUBCUTANEOUS | Status: AC
  Administered 2018-08-13: 100 mg via SUBCUTANEOUS

## 2018-08-13 NOTE — Progress Notes (Signed)
$'@Patient'T$  ID: Jose Ross, male    DOB: [birth year], 48 y.o.   MRN: [id number]  Chief Complaint  Patient presents with  . Follow-up    Asthma     Referring provider: No ref. provider found  HPI: 48 year old male former smoker seen for pulmonary consult for shortness of breath and wheezing on Dec 02, 2017 found to have significant allergic asthma  TEST  12/04/17 Exhaled nitric oxide testing today was very high at 152ppb. 11/2017 >Lab work showed very high eosinophil count.  With eosinophils at 5800.  IgE very high at 850. ANCA neg , Aspergillus panel neg   Elevated absolute eosinophil count of 5800 recorded on 12/04/2017.  absolute eosinophil count was 1400 on 01/06/2018.   -Elevated IgE level on 2 occasions of 850 and 588. -  high-resolution CT scan of the chest which did not reveal any pulmonary involvement.  - FGFR1 and PDGFR beta on peripheral blood was negative.  Strongyloides antibody was negative.  LDH was in the upper limit of normal. - CT scan of the abdomen on 03/20/2018 shows spleen size in the upper limits of normal.  Echocardiogram shows ejection fraction of 55 to 60% with mild thickening of the leaflets.  He had normal tryptase levels and B12 was normal. - PDGFR A and FIP1L1 was negative.  Jak 2 mutation was also negative. -GI referral GI for possible eosinophilic esophagitis.  His Nexium was increased to twice a day.  They thought doing EGD and biopsy will be low yield given patient is on prednisone.  Hematology referral Eosinophilia -workup as above  , eosinophils improved so deferred Bone marrow biopsy ENT referral 2019  [facility] referral 2019 -Dr. Zigmund Daniel  [facility] referral 08/2018    08/13/2018 Follow up : Severe persistent Asthma, Eosinophilia  Patient presents for a 1 month follow up . Patient has severe persistent asthma with eosinophilia and chronic sinusitis  with high symptom burden  He has underwent an extensive workup with Hematology , Allergy that  has been unrevealing for primary eosinophilic disorders. He is on aggressive asthma regimen with TRELEGY and Singulair . He has been on NUCALA for almost 6 months and chronic steroids , currently at prednisone '10mg'$  . He continues to be short of breath with activity , intermittent cough and wheezing . Has not seen a lot of improvement with NUCALA . Eosinophils have decreased . He has not been able to return to work due to symptoms .  Weight has went up >25lbs in last 6 months . He is not able to exercise or be active.  He has upcoming office visit at [facility] next week.  Continue to have sinus drainage and thick mucus at times.  No chest pain , orthopnea edema or fever.     No Known Allergies   There is no immunization history on file for this patient.  Past Medical History:  Diagnosis Date  . Asthma   . Eosinophilia   . Pulmonary nodules     Tobacco History: Social History   Tobacco Use  Smoking Status Former Smoker  . Packs/day: 0.25  . Years: 10.00  . Pack years: 2.50  . Types: Cigarettes  . Last attempt to quit: 2000  . Years since quitting: 20.0  Smokeless Tobacco Never Used   Counseling given: Not Answered   Outpatient Medications Prior to Visit  Medication Sig Dispense Refill  . albuterol (PROVENTIL HFA;VENTOLIN HFA) 108 (90 Base) MCG/ACT inhaler Inhale 2 puffs into the lungs every  4 (four) hours as needed for wheezing or shortness of breath (cough, shortness of breath or wheezing.). 1 Inhaler 5  . albuterol (PROVENTIL) (2.5 MG/3ML) 0.083% nebulizer solution Take 3 mLs (2.5 mg total) by nebulization every 4 (four) hours as needed for wheezing or shortness of breath. 150 mL 5  . amoxicillin-clavulanate (AUGMENTIN) 500-125 MG tablet Take 1 tablet by mouth 2 (two) times daily with a meal.  0  . benzonatate (TESSALON) 200 MG capsule TAKE ONE CAPSULE BY MOUTH THREE TIMES DAILY AS NEEDED FOR COUGH 45 capsule 1  . EPINEPHrine 0.3 mg/0.3 mL IJ SOAJ injection Inject one dose  intramuscularly for allergic reaction. May repeat one dose if needed after 5-15 minutes. Proceed to the ER  11  . Fluticasone-Umeclidin-Vilant 100-62.5-25 MCG/INH AEPB Inhale into the lungs.    . Mepolizumab 100 MG/ML SOAJ Inject into the skin.    . montelukast (SINGULAIR) 10 MG tablet Take 1 tablet (10 mg total) by mouth at bedtime. 30 tablet 5  . omeprazole (PRILOSEC) 20 MG capsule Take 1 capsule (20 mg total) by mouth 2 (two) times daily before a meal. 60 capsule 1  . predniSONE (DELTASONE) 10 MG tablet Take 1 tablet (10 mg total) by mouth daily with breakfast. (Patient taking differently: Take 20 mg by mouth daily with breakfast. ) 30 tablet 5   No facility-administered medications prior to visit.      Review of Systems:   Constitutional:   No  weight loss, night sweats,  Fevers, chills,  +fatigue, or  lassitude.  HEENT:   No headaches,  Difficulty swallowing,  Tooth/dental problems, or  Sore throat,                No sneezing, itching, ear ache,  +nasal congestion, post nasal drip,   CV:  No chest pain,  Orthopnea, PND, swelling in lower extremities, anasarca, dizziness, palpitations, syncope.   GI  No heartburn, indigestion, abdominal pain, nausea, vomiting, diarrhea, change in bowel habits, loss of appetite, bloody stools.   Resp:  .  No chest wall deformity  Skin: no rash or lesions.  GU: no dysuria, change in color of urine, no urgency or frequency.  No flank pain, no hematuria   MS:  No joint pain or swelling.  No decreased range of motion.  No back pain.    Physical Exam  BP 124/80 (BP Location: Left Arm, Cuff Size: Normal)   Pulse 87   Ht '5\' 10"'$  (1.778 m)   Wt 262 lb 3.2 oz (118.9 kg)   SpO2 97%   BMI 37.62 kg/m   GEN: A/Ox3; pleasant , NAD, obese    HEENT:  [place]/AT,  EACs-clear, TMs-wnl, NOSE-clear, THROAT-clear, no lesions, no postnasal drip or exudate noted.   NECK:  Supple w/ fair ROM; no JVD; normal carotid impulses w/o bruits; no thyromegaly or nodules  palpated; no lymphadenopathy.    RESP  Few trace exp wheezes on forced exp, talks in full sentences . no accessory muscle use, no dullness to percussion  CARD:  RRR, no m/r/g, no peripheral edema, pulses intact, no cyanosis or clubbing.  GI:   Soft & nt; nml bowel sounds; no organomegaly or masses detected.   Musco: Warm bil, no deformities or joint swelling noted.   Neuro: alert, no focal deficits noted.    Skin: Warm, no lesions or rashes    Lab Results:  CBC    Component Value Date/Time   WBC 11.8 (H) 06/24/2018 1301   RBC 5.36  06/24/2018 1301   HGB 16.7 06/24/2018 1301   HCT 51.2 06/24/2018 1301   PLT 212 06/24/2018 1301   MCV 95.5 06/24/2018 1301   MCH 31.2 06/24/2018 1301   MCHC 32.6 06/24/2018 1301   RDW 13.0 06/24/2018 1301   LYMPHSABS 3.7 06/24/2018 1301   MONOABS 0.9 06/24/2018 1301   EOSABS 0.2 06/24/2018 1301   BASOSABS 0.1 06/24/2018 1301    BMET    Component Value Date/Time   NA 140 06/24/2018 1301   K 3.6 06/24/2018 1301   CL 104 06/24/2018 1301   CO2 26 06/24/2018 1301   GLUCOSE 100 (H) 06/24/2018 1301   BUN 17 06/24/2018 1301   CREATININE 1.28 (H) 06/24/2018 1301   CALCIUM 9.0 06/24/2018 1301   GFRNONAA >60 06/24/2018 1301   GFRAA >60 06/24/2018 1301    BNP No results found for: BNP  ProBNP    Component Value Date/Time   PROBNP 14.0 12/04/2017 1548    Imaging: No results found.  Benralizumab SOSY 30 mg    Date Action Dose Route User   06/19/2018 1647 Given by Other 30 mg Subcutaneous (Left Arm) Nicki Reaper, Brianda Beitler B    Mepolizumab SOLR 100 mg    Date Action Dose Route User   07/17/2018 1718 Given by Other 100 mg Subcutaneous (Right Arm) Scott, Samya Siciliano B    Mepolizumab SOLR 100 mg    Date Action Dose Route User   08/13/2018 1255 Given 100 mg Subcutaneous (Left Arm) Nicki Reaper, Loveah Like B      PFT Results Latest Ref Rng & Units 12/29/2017  FVC-Pre L 4.52  FVC-Predicted Pre % 86  FVC-Post L 4.93  FVC-Predicted Post % 94  Pre FEV1/FVC % % 74    Post FEV1/FCV % % 77  FEV1-Pre L 3.34  FEV1-Predicted Pre % 81  FEV1-Post L 3.78  DLCO UNC% % 95  DLCO COR %Predicted % 106  TLC L 6.39  TLC % Predicted % 90  RV % Predicted % 92    Lab Results  Component Value Date   NITRICOXIDE 17 12/12/2017        Assessment & Plan:   Asthma Severe persistent Asthma with high symptom burden on aggressive treatment regimen including TRELEGY , Singulair and NUCALA and chronic steroids  No siginificant improvement on NUCALA , will need to decide on changing to new biologic  He has new consult with [facility] next week, once done , will look at recs .   Plan  Patient Instructions  Continue on TRELEGY , 1 puff daily , rinse after use  NO MINTS .  Mucinex DM Twice daily  As needed  Cough/congestion  Begin Saline nasal rinses Twice daily  .  Remain on Prednisone '10mg'$  daily for now . Follow up with [facility] for further recs.  Follow up Joseph next week .  Continue on [place]ng for now , once seen by [facility] then will decide on new therapy .  Continue on Prilosec '20mg'$  daily before meal .  Activity as tolerated.  Follow up with Dr. Lamonte Sakai  In 1 month and As needed   Please contact office for sooner follow up if symptoms do not improve or worsen or seek emergency care       Dyspnea Suspect is due to severe asthma , morbid obesity and deconditioning  Activity as tolerated. Weight loss  Would benefit from pulmonary rehab consider referral going forward.   Eosinophilia Extensive workup w/ no evidence of primary eosinophilc disorder  Cont on steroids for now would like to cont to decrease . Await consult next week  Cont on Nucala for now . [facility] [place] would not be better option      Rexene Edison, NP 08/13/2018

## 2018-08-13 NOTE — Assessment & Plan Note (Signed)
Suspect is due to severe asthma , morbid obesity and deconditioning  Activity as tolerated. Weight loss  Would benefit from pulmonary rehab consider referral going forward.

## 2018-08-13 NOTE — Assessment & Plan Note (Signed)
Extensive workup w/ no evidence of primary eosinophilc disorder  Cont on steroids for now would like to cont to decrease . Await consult next week  Cont on Nucala for now . [facility] Dupixent would not be better option

## 2018-08-13 NOTE — Assessment & Plan Note (Signed)
Severe persistent Asthma with high symptom burden on aggressive treatment regimen including TRELEGY , Singulair and NUCALA and chronic steroids  No siginificant improvement on NUCALA , will need to decide on changing to new biologic  He has new consult with [facility] next week, once done , will look at recs .   Plan  Patient Instructions  Continue on TRELEGY , 1 puff daily , rinse after use  NO MINTS .  Mucinex DM Twice daily  As needed  Cough/congestion  Begin Saline nasal rinses Twice daily  .  Remain on Prednisone 10mg  daily for now . Follow up with [facility] for further recs.  Follow up [facility] next week .  Continue on NUCALA for now , once seen by [facility] then will decide on new therapy .  Continue on Prilosec 20mg  daily before meal .  Activity as tolerated.  Follow up with Dr. Delton CoombesByrum  In 1 month and As needed   Please contact office for sooner follow up if symptoms do not improve or worsen or seek emergency care

## 2018-08-13 NOTE — Patient Instructions (Signed)
Continue on TRELEGY , 1 puff daily , rinse after use  NO MINTS .  Mucinex DM Twice daily  As needed  Cough/congestion  Begin Saline nasal rinses Twice daily  .  Remain on Prednisone 10mg  daily for now . Follow up with [facility] for further recs.  Follow up [facility] next week .  Continue on NUCALA for now , once seen by [facility] then will decide on new therapy .  Continue on Prilosec 20mg  daily before meal .  Activity as tolerated.  Follow up with Dr. Delton Coombes  In 1 month and As needed   Please contact office for sooner follow up if symptoms do not improve or worsen or seek emergency care

## 2018-09-09 ENCOUNTER — Telehealth: Payer: Self-pay | Admitting: Emergency Medicine

## 2018-09-09 NOTE — Telephone Encounter (Signed)
Called to order Nucala, rep said pt needs P/a.  I called [employer] and did a verbal rx. It was approved ref.#: [id number] Eff. Dates: 09/09/2018 - 09/10/2019. I called [employer] back and gave rep P/A #, she said she didn't need the dates.

## 2018-09-14 ENCOUNTER — Ambulatory Visit: Payer: 59 | Admitting: Emergency Medicine

## 2018-09-15 ENCOUNTER — Encounter: Payer: Self-pay | Admitting: Emergency Medicine

## 2018-09-15 ENCOUNTER — Ambulatory Visit (INDEPENDENT_AMBULATORY_CARE_PROVIDER_SITE_OTHER): Payer: 59 | Admitting: Emergency Medicine

## 2018-09-15 DIAGNOSIS — J4550 Severe persistent asthma, uncomplicated: Secondary | ICD-10-CM

## 2018-09-15 DIAGNOSIS — J301 Allergic rhinitis due to pollen: Secondary | ICD-10-CM | POA: Diagnosis not present

## 2018-09-15 MED ORDER — MEPOLIZUMAB 100 MG/ML ~~LOC~~ SOAJ: 100.0000 mg | SUBCUTANEOUS | 5 refills | Status: DC

## 2018-09-15 MED ORDER — PREDNISONE 5 MG PO TABS: 5.0000 mg | ORAL_TABLET | Freq: Every day | ORAL | 5 refills | Status: DC

## 2018-09-15 NOTE — Progress Notes (Signed)
Subjective:    Patient ID: Jose Ross, male    DOB: 08/12/1970, 48 y.o.   MRN: [id number]  Asthma  He complains of cough, shortness of breath and wheezing. Associated symptoms include headaches and sneezing. Pertinent negatives include no ear pain, fever, postnasal drip, rhinorrhea, sore throat or trouble swallowing. His past medical history is significant for asthma.  Shortness of Breath  Associated symptoms include headaches and wheezing. Pertinent negatives include no ear pain, fever, leg swelling, rash, rhinorrhea, sore throat or vomiting. His past medical history is significant for asthma.   ROV 03/09/18 --Jose Ross presents today for follow-up.  He is 48 years old with a minimal tobacco history (15 pack years), history of contact dermatitis.  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  Spirometry confirms mild obstruction with a positive bronchodilator response.  He has responded clinically to prednisone, benefits some from albuterol.  Evaluation has included an unrevealing CT scan of the chest with a negative ANCA making Churg-Strauss or eosinophilic granulomatosis unlikely.  Likewise he has had negative aspergillus IgE testing effectively ruling out ABPA.  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  I sent him for allergy evaluation, has seen Dr Dellis AnesGallagher, and surprisingly his skin testing was negative.  In light of this he was referred to hematology/oncology for further evaluation of his profound eosinophilia.  No evidence for parasitic infection on serological testing (or clinically).  Genetic testing for possible hypereosinophilic syndrome, myeloproliferative disorders has been sent.  Tryptase level reassuring without any evidence for mastocytosis. He is being considered for BM biopsy depending on where the workup  leads.   He does have persistent asthma, but there is now evidence that is may be secondary to some other primary eosinophilic process. I still believe it is appropriate to use Nucala, first dose was 02/12/18. He was treated with another pred taper, is currently off prednisone. When he came off anti-histamines for allergy testing he had significant patchy skin changes, is now back on zyrtec, singulair, on Dulera. He uses albuterol about 4x a day, does note a significant clinical change in breathing when he uses it. His wheeze appears to be better, still has severe exertional SOB.   ROV 04/09/18 --this is a follow-up visit for severe persistent asthma in the setting of hyper eosinophilia and an elevated IgE.  He has mild obstruction on his pulmonary function testing.  I evaluated him for possible ABPA, Churg-Strauss or eosinophilic granulomatosis without any evidence of any.  He has continued to be symptomatic even after starting on Nucala, he has had 2 doses.  His work-up for his hypereosinophilia has included cytogenetics which were normal. We started him on prednisone 20mg  last week because he was having persistent wheeze, cough, dyspnea. He is on nexium qd. Remains on singulair, Dulera, using albuterol several times a day. On zyrtec.   ROV 05/12/18 --Jose Ross follows up for severe persistent asthma, hyper-eosinophilia (reassuring cytogenetic work-up, BM Bx deferred for now), elevated IgE.  His obstruction on PFT is mild but his symptoms are persistent and very disruptive. Allergy w/u without any specific antigens to target.  He has been unable to work.  No evidence to date of Churg-Strauss, eosinophilic granulomatosis, ABPA.  He is now on Nucala and has completed 4 doses. Since last visit he was treated w abx for a possible acute sinusitis. He  reports that his breathing may be a bit better, but still freq albuterol use, freq cough and wheeze. He is on zyrtec - minimal response to nasal steroid.   ROV  07/13/18 --this a follow-up visit for moderate persistent asthma with allergic rhinitis, history of dermatitis and a hypereosinophilic phenotype with an elevated IgE.  Work-up as described above.  He has required chronic steroids, is on Nucala. No side effects. He isn't sure that it is making an impact.  Referred him to [facility] and his pred was increased to 40mg , then tapered. He is currently on pred 20mg . Has been seen by ENT as well, treated for chronic sinusitis, still dealing with eye drainage. Maintenance is now Trelegy. Using his albuterol 3-4x a day. He is going to see allergy [facility]. He is on short term disability - is unable to do his job as things stand.   ROV 09/15/2018 --Jose Ross is 48, returns today for further evaluation and care for his persistent asthma, allergic rhinitis with an associated hypereosinophilic phenotype and elevated IgE.  He is also been evaluated for systemic hypereosinophilia, evaluation reassuring to date.  No evidence of Churg-Strauss, eosinophilic granulomatosis, ABPA.  We have managed him on aggressive antihistamine and Nasal steroid therapy, leukotriene antagonist.  Skin testing was surprisingly negative, not on immunotherapy.  He has been seen by Dr. Lazarus Salines with ENT, treated for possible acute/chronic sinusitis, imaging confirms pansinusitis. Considering sinus surgery.  Bronchodilators changed to Trelegy which he is tolerating.  Remains on prednisone 10 mg daily (last 2 months), Nucala.  Plans are in place for him to try a change to Dupixent.he is concerned about the cost > it will cost him $300 more a month.    Review of Systems  Constitutional: Negative for fever and unexpected weight change.  HENT: Positive for congestion, nosebleeds, sinus pressure and sneezing. Negative for dental problem, ear pain, postnasal drip, rhinorrhea, sore throat and trouble swallowing.   Eyes: Negative for redness and itching.  Respiratory: Positive for cough, chest tightness, shortness of  breath and wheezing.   Cardiovascular: Negative for palpitations and leg swelling.  Gastrointestinal: Negative for nausea and vomiting.  Genitourinary: Negative for dysuria.  Musculoskeletal: Negative for joint swelling.  Skin: Negative for rash.  Allergic/Immunologic: Positive for environmental allergies. Negative for food allergies and immunocompromised state.  Neurological: Positive for headaches.  Hematological: Does not bruise/bleed easily.  Psychiatric/Behavioral: Negative for dysphoric mood. The patient is not nervous/anxious.         Objective:   Physical Exam Vitals:   09/15/18 1332  BP: 120/80  Pulse: 75  SpO2: 98%  Weight: 260 lb (117.9 kg)  Height: 5\' 10"  (1.778 m)   Gen: Pleasant, well-nourished, mild resp distress,  normal affect, developing some cushingoid facies  ENT: No lesions,  mouth clear,  oropharynx clear but crowded   Neck: No JVD, no stridor today   Lungs: No use of accessory muscles, no wheeze or crackles.   Cardiovascular: RRR, heart sounds normal, no murmur or gallops, no peripheral edema  Musculoskeletal: No deformities, no cyanosis or clubbing  Neuro: alert, non focal  Skin: Warm, no lesions or rash     Assessment & Plan:  Asthma Please continue Trelegy once daily.  Reminded to rinse and gargle after using. We will decrease your prednisone to 5 mg daily.  Please call our office if you have flaring symptoms after we make this change. Keep your albuterol available to use either 2 puffs or 1 nebulizer treatment up to every  4 hours if needed for shortness of breath, chest tightness, wheezing. We will continue Nucala injections for now.  Agree with investigating a possible change to Dupixent if this is feasible based on cost. We will discuss your chronic sinus inflammation with Dr. Lazarus Salines. Follow with Dr Delton Coombes or Andrey Campanile, NP, in 2-3 weeks to review your status  Allergic rhinitis Please continue your Singulair 10 mg each evening Continue  fluticasone nasal spray, 2 sprays each nostril once daily.  Levy Pupa, MD, PhD 09/15/2018, 1:57 PM [facility] [phone] or if no answer [phone]

## 2018-09-15 NOTE — Assessment & Plan Note (Signed)
Please continue Trelegy once daily.  Reminded to rinse and gargle after using. We will decrease your prednisone to 5 mg daily.  Please call our office if you have flaring symptoms after we make this change. Keep your albuterol available to use either 2 puffs or 1 nebulizer treatment up to every 4 hours if needed for shortness of breath, chest tightness, wheezing. We will continue Nucala injections for now.  Agree with investigating a possible change to Dupixent if this is feasible based on cost. We will discuss your chronic sinus inflammation with Dr. Lazarus Salines. Follow with Dr Delton Coombes or Andrey Campanile, NP, in 2-3 weeks to review your status

## 2018-09-15 NOTE — Assessment & Plan Note (Signed)
Please continue your Singulair 10 mg each evening Continue fluticasone nasal spray, 2 sprays each nostril once daily.

## 2018-09-15 NOTE — Patient Instructions (Addendum)
Please continue Trelegy once daily.  Reminded to rinse and gargle after using. We will decrease your prednisone to 5 mg daily.  Please call our office if you have flaring symptoms after we make this change. Keep your albuterol available to use either 2 puffs or 1 nebulizer treatment up to every 4 hours if needed for shortness of breath, chest tightness, wheezing. Please continue your Singulair 10 mg each evening Continue fluticasone nasal spray, 2 sprays each nostril once daily. We will continue Nucala injections for now.  Agree with investigating a possible change to Dupixent if this is feasible based on cost. We will discuss your chronic sinus inflammation with Dr. Lazarus Salines. Follow with Dr Delton Coombes or Andrey Campanile, NP, in 2-3 weeks to review your status

## 2018-09-15 NOTE — Telephone Encounter (Signed)
[place] nor the pt's ins. Faxed a copy of the P/A. I did a verbal. I have all the needed info., rep said I needed to call pt's ins. Co. To find out if pt is with another pharmacy. (This piece of info. Would have been on the approval. This could have been done. Pt would have his med.Marland Kitchen

## 2018-09-28 ENCOUNTER — Telehealth: Payer: Self-pay | Admitting: Emergency Medicine

## 2018-09-29 ENCOUNTER — Ambulatory Visit: Payer: 59 | Admitting: Emergency Medicine

## 2018-09-29 ENCOUNTER — Encounter: Payer: Self-pay | Admitting: Emergency Medicine

## 2018-09-29 ENCOUNTER — Other Ambulatory Visit: Payer: Self-pay

## 2018-09-29 ENCOUNTER — Ambulatory Visit (INDEPENDENT_AMBULATORY_CARE_PROVIDER_SITE_OTHER): Payer: 59

## 2018-09-29 DIAGNOSIS — J4550 Severe persistent asthma, uncomplicated: Secondary | ICD-10-CM | POA: Diagnosis not present

## 2018-09-29 DIAGNOSIS — J301 Allergic rhinitis due to pollen: Secondary | ICD-10-CM | POA: Diagnosis not present

## 2018-09-29 MED ORDER — MEPOLIZUMAB 100 MG ~~LOC~~ SOLR
100.0000 mg | Freq: Once | SUBCUTANEOUS | Status: AC
  Administered 2018-09-29: 100 mg via SUBCUTANEOUS

## 2018-09-29 NOTE — Patient Instructions (Addendum)
Please continue Trelegy 1 inhalation daily.  Rinse and gargle after using. Keep your albuterol available to use 2 puffs if needed for shortness of breath, chest tightness, wheezing. Please increase your prednisone back to 10 mg daily until we follow-up to make adjustments. Please continue Singulair as you have been taking it We will get your Nucala reinitiated.  We will need to clarify with pharmacy whether your refill went through as appropriate. Dr. Delton Coombes will discuss your sinus disease with Dr. Lazarus Salines Follow with Dr Delton Coombes in 1 month

## 2018-09-29 NOTE — Telephone Encounter (Signed)
Call received from Linton Rump [facility], [phone].  Amy stated Dr Lazarus Salines wanted to speak with Dr Delton Coombes.   Dr [phone]  Message routed to Dr Delton Coombes

## 2018-09-29 NOTE — Assessment & Plan Note (Signed)
Please continue Trelegy 1 inhalation daily.  Rinse and gargle after using. Keep your albuterol available to use 2 puffs if needed for shortness of breath, chest tightness, wheezing. Please increase your prednisone back to 10 mg daily until we follow-up to make adjustments. Please continue Singulair as you have been taking it We will get your Nucala reinitiated.  We will need to clarify with pharmacy whether your refill went through as appropriate. Dr. Salif Tay will discuss your sinus disease with Dr. Wolicki Follow with Dr Idriss Quackenbush in 1 month    

## 2018-09-29 NOTE — Telephone Encounter (Signed)
I was able to speak with Dr Lazarus Salines. We all agree that we would like to defer any sinus surgery unless we get any evidence that sinus disease is driving the pt's asthma, making it harder to control.

## 2018-09-29 NOTE — Progress Notes (Signed)
Subjective:    Patient ID: Jose Ross, male    DOB: 1970-11-16, 48 y.o.   MRN: [id number]  Asthma  He complains of cough, shortness of breath and wheezing. Associated symptoms include headaches and sneezing. Pertinent negatives include no ear pain, fever, postnasal drip, rhinorrhea, sore throat or trouble swallowing. His past medical history is significant for asthma.  Shortness of Breath  Associated symptoms include headaches and wheezing. Pertinent negatives include no ear pain, fever, leg swelling, rash, rhinorrhea, sore throat or vomiting. His past medical history is significant for asthma.   ROV 03/09/18 --Jose Ross presents today for follow-up.  He is 48 years old with a minimal tobacco history (15 pack years), history of contact dermatitis.  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  Spirometry confirms mild obstruction with a positive bronchodilator response.  He has responded clinically to prednisone, benefits some from albuterol.  Evaluation has included an unrevealing CT scan of the chest with a negative ANCA making Churg-Strauss or eosinophilic granulomatosis unlikely.  Likewise he has had negative aspergillus IgE testing effectively ruling out ABPA.  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  I sent him for allergy evaluation, has seen Dr Dellis Anes, and surprisingly his skin testing was negative.  In light of this he was referred to hematology/oncology for further evaluation of his profound eosinophilia.  No evidence for parasitic infection on serological testing (or clinically).  Genetic testing for possible hypereosinophilic syndrome, myeloproliferative disorders has been sent.  Tryptase level reassuring without any evidence for mastocytosis. He is being considered for BM biopsy depending on where the workup  leads.   He does have persistent asthma, but there is now evidence that is may be secondary to some other primary eosinophilic process. I still believe it is appropriate to use Nucala, first dose was 02/12/18. He was treated with another pred taper, is currently off prednisone. When he came off anti-histamines for allergy testing he had significant patchy skin changes, is now back on zyrtec, singulair, on Dulera. He uses albuterol about 4x a day, does note a significant clinical change in breathing when he uses it. His wheeze appears to be better, still has severe exertional SOB.   ROV 04/09/18 --this is a follow-up visit for severe persistent asthma in the setting of hyper eosinophilia and an elevated IgE.  He has mild obstruction on his pulmonary function testing.  I evaluated him for possible ABPA, Churg-Strauss or eosinophilic granulomatosis without any evidence of any.  He has continued to be symptomatic even after starting on Nucala, he has had 2 doses.  His work-up for his hypereosinophilia has included cytogenetics which were normal. We started him on prednisone 20mg  last week because he was having persistent wheeze, cough, dyspnea. He is on nexium qd. Remains on singulair, Dulera, using albuterol several times a day. On zyrtec.   ROV 05/12/18 --Jose Ross follows up for severe persistent asthma, hyper-eosinophilia (reassuring cytogenetic work-up, BM Bx deferred for now), elevated IgE.  His obstruction on PFT is mild but his symptoms are persistent and very disruptive. Allergy w/u without any specific antigens to target.  He has been unable to work.  No evidence to date of Churg-Strauss, eosinophilic granulomatosis, ABPA.  He is now on Nucala and has completed 4 doses. Since last visit he was treated w abx for a possible acute sinusitis. He  reports that his breathing may be a bit better, but still freq albuterol use, freq cough and wheeze. He is on zyrtec - minimal response to nasal steroid.   ROV  07/13/18 --this a follow-up visit for moderate persistent asthma with allergic rhinitis, history of dermatitis and a hypereosinophilic phenotype with an elevated IgE.  Work-up as described above.  He has required chronic steroids, is on Nucala. No side effects. He isn't sure that it is making an impact.  Referred him to [facility] and his pred was increased to , then tapered. He is currently on pred . Has been seen by ENT as well, treated for chronic sinusitis, still dealing with eye drainage. Maintenance is now Trelegy. Using his albuterol 3-4x a day. He is going to see allergy at [facility]. He is on short term disability - is unable to do his job as things stand.   ROV 09/15/2018 --Jose Ross is 48, returns today for further evaluation and care for his persistent asthma, allergic rhinitis with an associated hypereosinophilic phenotype and elevated IgE.  He is also been evaluated for systemic hypereosinophilia, evaluation reassuring to date.  No evidence of Churg-Strauss, eosinophilic granulomatosis, ABPA.  We have managed him on aggressive antihistamine and Nasal steroid therapy, leukotriene antagonist.  Skin testing was surprisingly negative, not on immunotherapy.  He has been seen by Dr. Lazarus Salines with ENT, treated for possible acute/chronic sinusitis, imaging confirms pansinusitis. Considering sinus surgery.  Bronchodilators changed to Trelegy which he is tolerating.  Remains on prednisone 10 mg daily (last 2 months), Nucala.  Plans are in place for him to try a change to Dupixent.he is concerned about the cost > it will cost him $300 more a month.   ROV 09/29/2018 --Jose Ross follows up today for his persistent asthma, allergic rhinitis and hypereosinophilic phenotype.  As detailed above we decreased his prednisone to 5 mg daily 2 weeks ago.  He has continued Nucala (unclear the Dupixent is going to be feasible due to cost, initiated by [facility]) but he is 10 days late because apparently it fell off of his med list - I believe  we refilled this 2 weeks ago. I still need to speak with Dr Lazarus Salines regarding the role of his sinus disease in his asthma management. He is having more wheeze, more tightness and SOB.    Review of Systems  Constitutional: Negative for fever and unexpected weight change.  HENT: Positive for congestion, nosebleeds, sinus pressure and sneezing. Negative for dental problem, ear pain, postnasal drip, rhinorrhea, sore throat and trouble swallowing.   Eyes: Negative for redness and itching.  Respiratory: Positive for cough, chest tightness, shortness of breath and wheezing.   Cardiovascular: Negative for palpitations and leg swelling.  Gastrointestinal: Negative for nausea and vomiting.  Genitourinary: Negative for dysuria.  Musculoskeletal: Negative for joint swelling.  Skin: Negative for rash.  Allergic/Immunologic: Positive for environmental allergies. Negative for food allergies and immunocompromised state.  Neurological: Positive for headaches.  Hematological: Does not bruise/bleed easily.  Psychiatric/Behavioral: Negative for dysphoric mood. The patient is not nervous/anxious.         Objective:   Physical Exam Vitals:   09/29/18 1110  BP: (!) 148/102  Pulse: 83  SpO2: 100%  Weight: 265 lb (120.2 kg)   Gen: Pleasant, well-nourished, mild resp distress,  normal affect, developing some cushingoid facies, gaining wt  ENT: No lesions,  mouth clear,  oropharynx clear but crowded   Neck: No JVD, no stridor   Lungs: No use of accessory muscles, more  coarse today on both insp and exp, few exp wheezes   Cardiovascular: RRR, heart sounds normal, no murmur or gallops, no peripheral edema  Musculoskeletal: No deformities, no cyanosis or clubbing  Neuro: alert, non focal  Skin: Warm, no lesions or rash     Assessment & Plan:  Asthma Please continue Trelegy 1 inhalation daily.  Rinse and gargle after using. Keep your albuterol available to use 2 puffs if needed for shortness of  breath, chest tightness, wheezing. Please increase your prednisone back to 10 mg daily until we follow-up to make adjustments. Please continue Singulair as you have been taking it We will get your Nucala reinitiated.  We will need to clarify with pharmacy whether your refill went through as appropriate. Dr. Delton Coombes will discuss your sinus disease with Dr. Lazarus Salines Follow with Dr Delton Coombes in 1 month  Allergic rhinitis Please continue Singulair as you have been taking it  Levy Pupa, MD, PhD 09/29/2018, 11:39 AM [facility] [phone] or if no answer [phone]

## 2018-09-29 NOTE — Assessment & Plan Note (Signed)
Please continue Singulair as you have been taking it

## 2018-09-30 NOTE — Progress Notes (Signed)
Charge was put in as buy & bill, Should have been Pt. Supplied.

## 2018-10-14 MED ORDER — MEPOLIZUMAB 100 MG ~~LOC~~ SOLR
100.0000 mg | Freq: Once | SUBCUTANEOUS | Status: AC
  Administered 2018-09-29: 100 mg via SUBCUTANEOUS

## 2018-10-14 NOTE — Addendum Note (Signed)
Addended by: Dimas Millin B on: 10/14/2018 12:18 PM   Modules accepted: Orders

## 2018-10-26 ENCOUNTER — Other Ambulatory Visit (HOSPITAL_COMMUNITY): Payer: 59

## 2018-10-27 MED ORDER — MEPOLIZUMAB 100 MG ~~LOC~~ SOLR
100.0000 mg | Freq: Once | SUBCUTANEOUS | Status: AC
  Administered 2018-09-29: 100 mg via SUBCUTANEOUS

## 2018-10-27 NOTE — Addendum Note (Signed)
Addended by: Dimas Millin B on: 10/27/2018 12:32 PM   Modules accepted: Orders

## 2018-10-28 NOTE — Telephone Encounter (Signed)
Pt's pharm is [place] [phone]. I called to order his Nucala, rep said pt needs a new script. Once I was finally able to speak to the pharmacist (after 5 transfers) she told me pt has 2 refills. Pharmacist went ahead and took a new verbal rx. It has to go thru [facility] (takes up to 3 to 5 days) either way. I asked her to please expedite the process. She said to call back Fri, I'm going to call 10/29/2018 (late morning) to see if med. Can be del. 10/30/2018.  Pt has missed an injection.

## 2018-10-29 ENCOUNTER — Encounter: Payer: Self-pay | Admitting: Emergency Medicine

## 2018-10-30 ENCOUNTER — Ambulatory Visit: Payer: 59 | Admitting: Emergency Medicine

## 2018-11-02 ENCOUNTER — Ambulatory Visit (HOSPITAL_COMMUNITY): Payer: 59 | Admitting: Hematology

## 2018-11-02 NOTE — Telephone Encounter (Deleted)
I called [facility] back to see if pt's Nucala was ready to order yet. Pharm had to verify pt's [employer] [facility] benefits. Pt's Nucala is coming in 11/04/2018.  Dupixent Order: 300mg  #1 prefilled syringe Ordered Date: Not sure Expected shipping date from pharmacy: 11/03/2018 Ordered by: Rep said someone up front. [facility]

## 2018-11-04 NOTE — Telephone Encounter (Signed)
Nucala Order: 100mg  #1 Vial Order Date: 11/03/2018 Expected shipping Date: 11/04/2018 Ordered by: [employer] [facility]    Nucala Shipment Received: 100mg  #1 vial Medication arrival date:11/04/2018 Lot #: [id number] Medication exp date:05/2020 Received by: TBS

## 2018-11-05 ENCOUNTER — Other Ambulatory Visit: Payer: Self-pay

## 2018-11-05 ENCOUNTER — Ambulatory Visit (INDEPENDENT_AMBULATORY_CARE_PROVIDER_SITE_OTHER): Payer: 59 | Admitting: *Deleted

## 2018-11-05 DIAGNOSIS — J4550 Severe persistent asthma, uncomplicated: Secondary | ICD-10-CM

## 2018-11-05 MED ORDER — MEPOLIZUMAB 100 MG ~~LOC~~ SOLR
100.0000 mg | Freq: Once | SUBCUTANEOUS | Status: AC
  Administered 2018-11-05: 100 mg via SUBCUTANEOUS

## 2018-11-05 NOTE — Progress Notes (Signed)
Have you been hospitalized within the last 10 days?  No Do you have a fever?  No Do you have a cough?  No Do you have a headache or sore throat? No  

## 2018-11-10 ENCOUNTER — Ambulatory Visit: Payer: 59 | Admitting: Nurse Practitioner

## 2018-11-10 ENCOUNTER — Telehealth: Payer: Self-pay | Admitting: Emergency Medicine

## 2018-11-10 ENCOUNTER — Ambulatory Visit: Payer: 59 | Admitting: Adult Health

## 2018-11-10 ENCOUNTER — Other Ambulatory Visit: Payer: Self-pay

## 2018-11-10 ENCOUNTER — Other Ambulatory Visit (HOSPITAL_COMMUNITY): Payer: 59

## 2018-11-10 ENCOUNTER — Encounter: Payer: Self-pay | Admitting: Adult Health

## 2018-11-10 DIAGNOSIS — J310 Chronic rhinitis: Secondary | ICD-10-CM | POA: Diagnosis not present

## 2018-11-10 DIAGNOSIS — K219 Gastro-esophageal reflux disease without esophagitis: Secondary | ICD-10-CM

## 2018-11-10 DIAGNOSIS — J4550 Severe persistent asthma, uncomplicated: Secondary | ICD-10-CM

## 2018-11-10 MED ORDER — PREDNISONE 5 MG PO TABS: ORAL_TABLET | ORAL | 1 refills | Status: DC

## 2018-11-10 NOTE — Telephone Encounter (Signed)
Received return to work form via [employer] from [employer]. Will fwd to Jess, for Tammy Parrett to complete -pr

## 2018-11-10 NOTE — Addendum Note (Signed)
Addended by: Boone Master E on: 11/10/2018 11:07 AM   Modules accepted: Orders

## 2018-11-10 NOTE — Assessment & Plan Note (Signed)
Severe persistent asthma with hyper -eosinophilic phenotype Symptoms currently stable on his aggressive regimen.  Will attempt to decrease prednisone very slowly. If continues to have significant symptom burden and unable to wean off of steroids will need to look further into changing to alternative such as Dupixent and financial assistance  Plan  Patient Instructions  Continue on TRELEGY , 1 puff daily , rinse after use  NO MINTS .  Mucinex DM Twice daily  As needed  Cough/congestion  Saline nasal rinses Twice daily As needed   Decrease Prednisone 10mg  daily alternate 5mg  daily  Continue on NUCALA for now .  Continue on Prilosec 20mg  daily before meal .  Continue on Singulair 10mg  daily  Continue on Zyrtec 10mg  At bedtime  .  Flonase Nasal daily As needed   Activity as tolerated. Advance as able  Work on [employer] .  Follow up with Dr. Delton Coombes  Or Gimena Buick NP In 1 month and As needed   Please contact office for sooner follow up if symptoms do not improve or worsen or seek emergency care

## 2018-11-10 NOTE — Patient Instructions (Addendum)
Continue on TRELEGY , 1 puff daily , rinse after use  NO MINTS .  Mucinex DM Twice daily  As needed  Cough/congestion  Saline nasal rinses Twice daily As needed   Decrease Prednisone 10mg  daily alternate 5mg  daily  Continue on NUCALA for now .  Continue on Prilosec 20mg  daily before meal .  Continue on Singulair 10mg  daily  Continue on Zyrtec 10mg  At bedtime  .  Flonase Nasal daily As needed   Activity as tolerated. Advance as able  Work on [employer] .  Follow up with Dr. Delton Coombes  Or Pasqualino Witherspoon NP In 1 month and As needed   Please contact office for sooner follow up if symptoms do not improve or worsen or seek emergency care

## 2018-11-10 NOTE — Telephone Encounter (Signed)
No forms received from [employer] Will continue to watch for these  Called [employer] @ [phone] and spoke with Bonita Quin to inquire about forms - forms were couriered over on 4.23.2020 to Dr Kavin Leech attn.  Checked with Lillia Abed who has not received forms for Dr Delton Coombes Will check with Rodell Perna

## 2018-11-10 NOTE — Telephone Encounter (Signed)
Will route message to nurse to see if she has received any forms re: patient returning to work. No paper work located [employer] regarding patient.   TP/Jessica please advise, if you have any paper work from [employer].

## 2018-11-10 NOTE — Assessment & Plan Note (Signed)
Continue on Prilosec and GERD diet

## 2018-11-10 NOTE — Assessment & Plan Note (Signed)
Continue on current regimen, trigger prevention

## 2018-11-10 NOTE — Assessment & Plan Note (Signed)
Encouraged on healthy diet and weight loss

## 2018-11-10 NOTE — Telephone Encounter (Signed)
Called and spoke with Patient.  Explained forms were being sent to office by courier from [employer], and someone will contact him.   Will route message to [facility]

## 2018-11-10 NOTE — Progress Notes (Signed)
$'@Patient'r$  ID: Jose Ross, male    DOB: [birth year], 48 y.o.   MRN: [id number]  Chief Complaint  Patient presents with  . Follow-up    Asthma     Referring provider: No ref. provider found  HPI: 48 year old male former smoker seen for pulmonary consult for shortness of breath and wheezing on May 21, 2036fund to have significant allergic asthma  TEST  5/23/19Exhaled nitric oxide testing today was very high at 152ppb. 11/2017 >Lab work showed very high eosinophil count. With eosinophils at 5800.IgE very high at 850. ANCA neg , Aspergillus panel neg  Elevated absolute eosinophil count of 5800 recorded on 12/04/2017. absolute eosinophil count was 1400 on 01/06/2018.  -Elevated IgE level on 2 occasions of 850 and 588. -  high-resolution CT scan of the chest which did not reveal any pulmonary involvement.  - FGFR1 and PDGFR beta on peripheral blood was negative.Strongyloides antibody was negative. LDH was in the upper limit of normal. - CT scan of the abdomen on 03/20/2018 shows spleen size in the upper limits of normal. Echocardiogram shows ejection fraction of 55 to 60% with mild thickening of the leaflets. He had normal tryptase levels and B12 was normal. - PDGFR A and FIP1L1wasnegative. Jak 2 mutation was also negative. -GI referral GI for possible eosinophilic esophagitis. His Nexium was increased to twice a day. They thought doing EGD and biopsy will be low yield given patient is on prednisone.  Hematology referral Eosinophilia -workup as above  , eosinophils improved so deferred Bone marrow biopsy ENT referral 2019  [facility] referral 2019 -Dr. MZigmund Daniel [facility] referral 08/2018    11/10/2018 Follow up : Severe Persistent Asthma, Eosinophilia  Patient returns for a 6 week follow up .  Patient has severe persistent asthma, allergic rhinitis, hyper eosinophilic phenotype.  He has had an aggressive work-up for hypereosinophilia.  There was no evidence of  Churg-Strauss, eosinophilic granulomatosis, ABPA.  As above.  He has been managed aggressively on Zyrtec daily, Flonase, Singulair, Nucala (began February 12, 2018), Trelegy and chronic steroids.  Baseline prednisone dose is 5 mg daily.  Over the last 2 months patient was having increased symptom burden.  He was increased to prednisone 10 mg daily.  He has significant decreased activity intolerance.  He has not been able to work for several months.  He has intermittent wheezing and cough and shortness of breath.  Weight has increased 40 pounds over the last 6 months.  Has been evaluated by ENT for chronic sinusitis.  [facility]  He has been [place] been a limiting factor. Patient says since last visit he is doing okay.  Breathing seems to be slightly improved.  He continues to have intermittent cough and wheezing.  Has shortness of breath with activity.  We discussed a healthy diet and light exercise to begin with to help with weight loss.  Patient education on potential steroid side effects. He denies any chest pain orthopnea PND or increased leg swelling.  Uses albuterol on average 1-2 times a day.  Says that his sinus symptoms have actually been improved over the last month.  Nasal congestion is decreased  Remains out of work .  COVID-19 precautions discussed.Girlfriend works at [employer].  He says she is extremely careful.    No Known Allergies   There is no immunization history on file for this patient.  Past Medical History:  Diagnosis Date  . Asthma   . Eosinophilia   . Pulmonary nodules     Tobacco History: Social History   Tobacco Use  Smoking Status Former Smoker  . Packs/day: 0.25  . Years: 10.00  . Pack years: 2.50  . Types: Cigarettes  . Last attempt to quit: 2000  . Years since quitting: 20.3  Smokeless Tobacco Never Used   Counseling given: Not  Answered   Outpatient Medications Prior to Visit  Medication Sig Dispense Refill  . albuterol (PROVENTIL HFA;VENTOLIN HFA) 108 (90 Base) MCG/ACT inhaler Inhale 2 puffs into the lungs every 4 (four) hours as needed for wheezing or shortness of breath (cough, shortness of breath or wheezing.). 1 Inhaler 5  . albuterol (PROVENTIL) (2.5 MG/3ML) 0.083% nebulizer solution Take 3 mLs (2.5 mg total) by nebulization every 4 (four) hours as needed for wheezing or shortness of breath. 150 mL 5  . benzonatate (TESSALON) 200 MG capsule TAKE ONE CAPSULE BY MOUTH THREE TIMES DAILY AS NEEDED FOR COUGH 45 capsule 1  . EPINEPHrine 0.3 mg/0.3 mL IJ SOAJ injection Inject one dose intramuscularly for allergic reaction. May repeat one dose if needed after 5-15 minutes. Proceed to the ER  11  . Fluticasone-Umeclidin-Vilant 100-62.5-25 MCG/INH AEPB Inhale into the lungs.    . Mepolizumab 100 MG/ML SOAJ Inject 100 mg into the skin every 28 (twenty-eight) days. 1 mL 5  . montelukast (SINGULAIR) 10 MG tablet Take 1 tablet (10 mg total) by mouth at bedtime. 30 tablet 5  . omeprazole (PRILOSEC) 20 MG capsule Take 1 capsule (20 mg total) by mouth 2 (two) times daily before a meal. 60 capsule 1  . predniSONE (DELTASONE) 5 MG tablet Take 1 tablet (5 mg total) by mouth daily with breakfast. 30 tablet 5   No facility-administered medications prior to visit.      Review of Systems:   Constitutional:   No  weight loss, night sweats,  Fevers, chills,  +fatigue, or  lassitude.  HEENT:   No headaches,  Difficulty swallowing,  Tooth/dental problems, or  Sore throat,                No sneezing, itching, ear ache,  +nasal congestion, post nasal drip,   CV:  No chest pain,  Orthopnea, PND, swelling in lower extremities, anasarca, dizziness, palpitations, syncope.   GI  No heartburn, indigestion, abdominal pain, nausea, vomiting, diarrhea, change in bowel habits, loss of appetite, bloody stools.   Resp:    No chest wall  deformity  Skin: no rash or lesions.  GU: no dysuria, change in color of urine, no urgency or frequency.  No flank pain, no hematuria   MS:  No joint pain or swelling.  No decreased range of motion.  No back pain.    Physical Exam  BP 136/68 (BP Location: Left Arm, Cuff Size: Normal)   Pulse 93   Temp 98 F (36.7 C) (Oral)   Ht '5\' 10"'$  (1.778 m)   Wt 271 lb 12.8 oz (123.3 kg)   SpO2 100%   BMI 39.00 kg/m   GEN: A/Ox3; pleasant , NAD, obese   HEENT:  [place]/AT,   , NOSE-clear, THROAT-clear, no lesions, no postnasal drip or exudate noted.   NECK:  Supple w/ fair ROM; no JVD; normal carotid impulses w/o bruits; no thyromegaly or nodules palpated; no lymphadenopathy.    RESP few trace expiratory wheezes on forced expiration otherwise clear.  Speaks in full sentences  no  accessory muscle use, no dullness to percussion  CARD:  RRR, no m/r/g, no peripheral edema, pulses intact, no cyanosis or clubbing.  GI:   Soft & nt; nml bowel sounds; no organomegaly or masses detected.   Musco: Warm bil, no deformities or joint swelling noted.   Neuro: alert, no focal deficits noted.    Skin: Warm, no lesions or rashes    Lab Results:  CBC    Component Value Date/Time   WBC 11.8 (H) 06/24/2018 1301   RBC 5.36 06/24/2018 1301   HGB 16.7 06/24/2018 1301   HCT 51.2 06/24/2018 1301   PLT 212 06/24/2018 1301   MCV 95.5 06/24/2018 1301   MCH 31.2 06/24/2018 1301   MCHC 32.6 06/24/2018 1301   RDW 13.0 06/24/2018 1301   LYMPHSABS 3.7 06/24/2018 1301   MONOABS 0.9 06/24/2018 1301   EOSABS 0.2 06/24/2018 1301   BASOSABS 0.1 06/24/2018 1301    BMET    Component Value Date/Time   NA 140 06/24/2018 1301   K 3.6 06/24/2018 1301   CL 104 06/24/2018 1301   CO2 26 06/24/2018 1301   GLUCOSE 100 (H) 06/24/2018 1301   BUN 17 06/24/2018 1301   CREATININE 1.28 (H) 06/24/2018 1301   CALCIUM 9.0 06/24/2018 1301   GFRNONAA >60 06/24/2018 1301   GFRAA >60 06/24/2018 1301    BNP No results  found for: BNP  ProBNP    Component Value Date/Time   PROBNP 14.0 12/04/2017 1548    Imaging: No results found.  Mepolizumab SOLR 100 mg    Date Action Dose Route User   09/29/2018 1214 Given 100 mg Subcutaneous (Right Arm) [facility], Lendy Dittrich B    Mepolizumab SOLR 100 mg    Date Action Dose Route User   09/29/2018 1216 Given 100 mg Subcutaneous (Right Arm) Scott, Capri Veals B    Mepolizumab SOLR 100 mg    Date Action Dose Route User   09/29/2018 1227 Given 100 mg Subcutaneous (Right Arm) Scott, Gregori Abril B    Mepolizumab SOLR 100 mg    Date Action Dose Route User   11/05/2018 1221 Given 100 mg Subcutaneous (Left Arm) Nicki Re[facility], Kemaya Dorner B      PFT Results Latest Ref Rng & Units 12/29/2017  FVC-Pre L 4.52  FVC-Predicted Pre % 86  FVC-Post L 4.93  FVC-Predicted Post % 94  Pre FEV1/FVC % % 74  Post FEV1/FCV % % 77  FEV1-Pre L 3.34  FEV1-Predicted Pre % 81  FEV1-Post L 3.78  DLCO UNC% % 95  DLCO COR %Predicted % 106  TLC L 6.39  TLC % Predicted % 90  RV % Predicted % 92    Lab Results  Component Value Date   NITRICOXIDE 17 12/12/2017        Assessment & Plan:   Asthma Severe persistent asthma with hyper -eosinophilic phenotype Symptoms currently stable on his aggressive regimen.  Will attempt to decrease prednisone very slowly. If continues to have significant symptom burden and unable to wean off of steroids will need to look further into changing to alternative such as Dupixent and financial assistance  Plan  Patient Instructions  Continue on TRELEGY , 1 puff daily , rinse after use  NO MINTS .  Mucinex DM Twice daily  As needed  Cough/congestion  Saline nasal rinses Twice daily As needed   Decrease Prednisone '10mg'$  daily alternate '5mg'$  daily  Continue on NUCALA for now .  Continue on Prilosec '20mg'$  daily before meal .  Continue on Singulair '10mg'$   daily  Continue on Zyrtec '10mg'$  At bedtime  .  Flonase Nasal daily As needed   Activity as tolerated. Advance as able  Work on  [employer] .  Follow up with Dr. Lamonte Sakai  Or Toshie Demelo NP In 1 month and As needed   Please contact office for sooner follow up if symptoms do not improve or worsen or seek emergency care       Chronic rhinitis Continue on current regimen, trigger prevention  GERD (gastroesophageal reflux disease) Continue on Prilosec and GERD diet  Morbid obesity ([place]) Encouraged on healthy diet and weight loss     Rexene Edison, NP 11/10/2018

## 2018-11-10 NOTE — Telephone Encounter (Signed)
Pt calling back about these forms [phone]

## 2018-11-11 ENCOUNTER — Inpatient Hospital Stay (HOSPITAL_COMMUNITY): Payer: 59 | Admitting: Hematology

## 2018-11-11 ENCOUNTER — Encounter (HOSPITAL_COMMUNITY): Payer: Self-pay | Admitting: Hematology

## 2018-11-11 ENCOUNTER — Inpatient Hospital Stay (HOSPITAL_COMMUNITY): Payer: 59 | Attending: Nurse Practitioner

## 2018-11-11 VITALS — BP 146/102 | HR 101 | Temp 97.5°F | Resp 18 | Wt 271.6 lb

## 2018-11-11 DIAGNOSIS — D721 Eosinophilia, unspecified: Secondary | ICD-10-CM

## 2018-11-11 DIAGNOSIS — D751 Secondary polycythemia: Secondary | ICD-10-CM | POA: Diagnosis not present

## 2018-11-11 DIAGNOSIS — D649 Anemia, unspecified: Secondary | ICD-10-CM

## 2018-11-11 DIAGNOSIS — R609 Edema, unspecified: Secondary | ICD-10-CM | POA: Diagnosis not present

## 2018-11-11 LAB — COMPREHENSIVE METABOLIC PANEL WITH GFR
ALT: 31 U/L (ref 0–44)
AST: 20 U/L (ref 15–41)
Albumin: 3.9 g/dL (ref 3.5–5.0)
Alkaline Phosphatase: 106 U/L (ref 38–126)
Anion gap: 11 (ref 5–15)
BUN: 16 mg/dL (ref 6–20)
CO2: 26 mmol/L (ref 22–32)
Calcium: 8.9 mg/dL (ref 8.9–10.3)
Chloride: 103 mmol/L (ref 98–111)
Creatinine, Ser: 1.17 mg/dL (ref 0.61–1.24)
GFR calc Af Amer: >60 mL/min
GFR calc non Af Amer: >60 mL/min
Glucose, Bld: 102 mg/dL — ABNORMAL HIGH (ref 70–99)
Potassium: 3.9 mmol/L (ref 3.5–5.1)
Sodium: 140 mmol/L (ref 135–145)
Total Bilirubin: 0.5 mg/dL (ref 0.3–1.2)
Total Protein: 7 g/dL (ref 6.5–8.1)

## 2018-11-11 LAB — CBC WITH DIFFERENTIAL/PLATELET
Abs Immature Granulocytes: 0.09 10*3/uL — ABNORMAL HIGH (ref 0.00–0.07)
Basophils Absolute: 0.1 10*3/uL (ref 0.0–0.1)
Basophils Relative: 1 %
Eosinophils Absolute: 0.1 10*3/uL (ref 0.0–0.5)
Eosinophils Relative: 1 %
HCT: 51.4 % (ref 39.0–52.0)
Hemoglobin: 16.8 g/dL (ref 13.0–17.0)
Immature Granulocytes: 1 %
Lymphocytes Relative: 29 %
Lymphs Abs: 2.7 10*3/uL (ref 0.7–4.0)
MCH: 30.8 pg (ref 26.0–34.0)
MCHC: 32.7 g/dL (ref 30.0–36.0)
MCV: 94.3 fL (ref 80.0–100.0)
Monocytes Absolute: 0.7 10*3/uL (ref 0.1–1.0)
Monocytes Relative: 8 %
Neutro Abs: 5.5 10*3/uL (ref 1.7–7.7)
Neutrophils Relative %: 60 %
Platelets: 187 10*3/uL (ref 150–400)
RBC: 5.45 MIL/uL (ref 4.22–5.81)
RDW: 12.5 % (ref 11.5–15.5)
WBC: 9.2 10*3/uL (ref 4.0–10.5)
nRBC: 0 % (ref 0.0–0.2)

## 2018-11-11 NOTE — Progress Notes (Signed)
[place], [place] [address]   CLINIC:  Medical Oncology/Hematology  PCP:  Patient, No Pcp Per No address on file None   REASON FOR VISIT:  Follow-up for esoinophila  CURRENT THERAPY: Observation    INTERVAL HISTORY:  Jose Ross 48 y.o. male returns for routine follow-up. He is here today alone. He states that he continues using the inhalers and prednisone as directed. He states that he feels about the same as the his last visit. He states that he has noticed weight gain. Denies any nausea, vomiting, or diarrhea. Denies any new pains. Had not noticed any recent bleeding such as epistaxis, hematuria or hematochezia. Denies recent chest pain on exertion, shortness of breath on minimal exertion, pre-syncopal episodes, or palpitations. Denies any numbness or tingling in hands or feet. Denies any recent fevers, infections, or recent hospitalizations. Patient reports appetite at 75% and energy level at 75%.    REVIEW OF SYSTEMS:  Review of Systems  Respiratory: Positive for shortness of breath.   All other systems reviewed and are negative.    PAST MEDICAL/SURGICAL HISTORY:  Past Medical History:  Diagnosis Date  . Asthma   . Eosinophilia   . Pulmonary nodules    History reviewed. No pertinent surgical history.   SOCIAL HISTORY:  Social History   Socioeconomic History  . Marital status: Single    Spouse name: Not on file  . Number of children: 2  . Years of education: Not on file  . Highest education level: Not on file  Occupational History  . Not on file  Social Needs  . Financial resource strain: Not hard at all  . Food insecurity:    Worry: Never true    Inability: Never true  . Transportation needs:    Medical: No    Non-medical: No  Tobacco Use  . Smoking status: Former Smoker    Packs/day: 0.25    Years: 10.00    Pack years: 2.50    Types: Cigarettes    Last attempt to quit: 2000    Years since quitting: 20.3   . Smokeless tobacco: Never Used  Substance and Sexual Activity  . Alcohol use: Yes    Comment: 4-5 drinks a week  . Drug use: Not Currently  . Sexual activity: Not on file  Lifestyle  . Physical activity:    Days per week: 3 days    Minutes per session: 30 min  . Stress: Only a little  Relationships  . Social connections:    Talks on phone: More than three times a week    Gets together: Three times a week    Attends religious service: Never    Active member of club or organization: No    Attends meetings of clubs or organizations: Never    Relationship status: Divorced  . Intimate partner violence:    Fear of current or ex partner: No    Emotionally abused: No    Physically abused: No    Forced sexual activity: No  Other Topics Concern  . Not on file  Social History Narrative  . Not on file    FAMILY HISTORY:  Family History  Problem Relation Age of Onset  . Asthma Mother   . Skin cancer Mother     CURRENT MEDICATIONS:  Outpatient Encounter Medications as of 11/11/2018  Medication Sig  . albuterol (PROVENTIL HFA;VENTOLIN HFA) 108 (90 Base) MCG/ACT inhaler Inhale 2 puffs into the lungs every 4 (four)  hours as needed for wheezing or shortness of breath (cough, shortness of breath or wheezing.).  . albuterol (PROVENTIL) (2.5 MG/3ML) 0.083% nebulizer solution Take 3 mLs (2.5 mg total) by nebulization every 4 (four) hours as needed for wheezing or shortness of breath.  . benzonatate (TESSALON) 200 MG capsule TAKE ONE CAPSULE BY MOUTH THREE TIMES DAILY AS NEEDED FOR COUGH  . EPINEPHrine 0.3 mg/0.3 mL IJ SOAJ injection Inject one dose intramuscularly for allergic reaction. May repeat one dose if needed after 5-15 minutes. Proceed to the ER  . Fluticasone-Umeclidin-Vilant 100-62.5-25 MCG/INH AEPB Inhale into the lungs.  . Mepolizumab 100 MG/ML SOAJ Inject 100 mg into the skin every 28 (twenty-eight) days.  . montelukast (SINGULAIR) 10 MG tablet Take 1 tablet (10 mg total) by  mouth at bedtime.  . omeprazole (PRILOSEC) 20 MG capsule Take 1 capsule (20 mg total) by mouth 2 (two) times daily before a meal.  . predniSONE (DELTASONE) 5 MG tablet Alternate 10mg and 5mg daily   No facility-administered encounter medications on file as of 11/11/2018.     ALLERGIES:  No Known Allergies   PHYSICAL EXAM:  ECOG Performance status: 0  Vitals:   11/11/18 1115  BP: (!) 146/102  Pulse: (!) 101  Resp: 18  Temp: (!) 97.5 F (36.4 C)  SpO2: 98%   Filed Weights   11/11/18 1115  Weight: 271 lb 9.6 oz (123.2 kg)    Physical Exam Vitals signs reviewed.  Constitutional:      Appearance: Normal appearance.  Cardiovascular:     Rate and Rhythm: Normal rate and regular rhythm.     Heart sounds: Normal heart sounds.  Pulmonary:     Effort: Pulmonary effort is normal.     Breath sounds: Normal breath sounds.  Abdominal:     General: There is no distension.     Palpations: Abdomen is soft.     Tenderness: There is no abdominal tenderness.  Musculoskeletal:        General: No swelling.  Skin:    General: Skin is warm.  Neurological:     General: No focal deficit present.     Mental Status: He is alert and oriented to person, place, and time.  Psychiatric:        Mood and Affect: Mood normal.        Behavior: Behavior normal.      LABORATORY DATA:  I have reviewed the labs as listed.  CBC    Component Value Date/Time   WBC 9.2 11/11/2018 0859   RBC 5.45 11/11/2018 0859   HGB 16.8 11/11/2018 0859   HCT 51.4 11/11/2018 0859   PLT 187 11/11/2018 0859   MCV 94.3 11/11/2018 0859   MCH 30.8 11/11/2018 0859   MCHC 32.7 11/11/2018 0859   RDW 12.5 11/11/2018 0859   LYMPHSABS 2.7 11/11/2018 0859   MONOABS 0.7 11/11/2018 0859   EOSABS 0.1 11/11/2018 0859   BASOSABS 0.1 11/11/2018 0859   CMP Latest Ref Rng & Units 11/11/2018 06/24/2018 03/05/2018  Glucose 70 - 99 mg/dL 102(H) 100(H) 95  BUN 6 - 20 mg/dL 16 17 24(H)  Creatinine 0.61 - 1.24 mg/dL 1.17 1.28(H)  1.28(H)  Sodium 135 - 145 mmol/L 140 140 140  Potassium 3.5 - 5.1 mmol/L 3.9 3.6 4.9  Chloride 98 - 111 mmol/L 103 104 102  CO2 22 - 32 mmol/L 26 26 31  Calcium 8.9 - 10.3 mg/dL 8.9 9.0 9.0  Total Protein 6.5 - 8.1 g/dL 7.0 7.0 6.6    Total Bilirubin 0.3 - 1.2 mg/dL 0.5 0.7 0.7  Alkaline Phos 38 - 126 U/L 106 84 94  AST 15 - 41 U/L _0 ALT 0 - 44 U/L _1 DIAGNOSTIC IMAGING:  I have independently reviewed the scans and discussed with the patient.   I have reviewed Venita Lick LPN's note and agree with the documentation.  I personally performed a face-to-face visit, made revisions and my assessment and plan is as follows.    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  Most recent dated absolute eosinophil count was 1400 on 01/06/2018.   -Elevated IgE level on 2 occasions of 850 and 588. - I have reviewed his high-resolution CT scan of the chest which did not reveal any pulmonary involvement.  He does not have any gastrointestinal symptoms. - FGFR1 and PDGFR beta on peripheral blood was negative.  Strongyloides antibody was negative.  LDH was in the upper limit of normal. - CT scan of the abdomen on 03/20/2018 shows spleen size in the upper limits of normal.  Echocardiogram shows ejection fraction of 55 to 60% with mild thickening of the leaflets.  He had normal tryptase levels and B12 was normal. - PDGFR A and FIP1L1 was negative.  Jak 2 mutation was also negative. - As there was no convincing evidence of primary eosinophilic disorders, I did not recommend bone marrow aspiration and biopsy. - His eosinophil count has been normal in the past few months. -He is currently on prednisone 5 mg alternating with 10 mg.  They were trying to taper it down.  I plan to repeat his CBC with differential in 4 months after complete taper of prednisone. -I will also obtain IgE levels at that time.  If these  are normal, will consider PRN follow-up.  2.  Moderate persistent asthma: -He is continuing Nucala injections monthly.  He is also using prednisone along with inhalers.         Orders placed this encounter:  Orders Placed This Encounter  Procedures  . CBC with Differential/Platelet  . Comprehensive metabolic panel  . Kouts, MD [place] [phone]

## 2018-11-11 NOTE — Telephone Encounter (Signed)
Pt is asking for an update & can be reached at [phone].

## 2018-11-11 NOTE — Telephone Encounter (Signed)
Rec'd completed paperwork from [facility]. Sent to [employer] via interoffice mail -pr

## 2018-11-11 NOTE — Telephone Encounter (Signed)
Called patient back and left message after being informed forms have been signed by TP and sent back out to [facility] for processing. If he has any further questions he my contact our office back. Nothing further needed.

## 2018-11-11 NOTE — Patient Instructions (Addendum)
[facility] at [facility] Discharge Instructions  You were seen today by Dr. Katragadda. He went over your recent lab results. He will see you back in 4 months for labs and follow up.   Thank you for choosing [facility] at [facility] to provide your oncology and hematology care.  To afford each patient quality time with our provider, please arrive at least 15 minutes before your scheduled appointment time.   If you have a lab appointment with the Cancer Center please come in thru the  Main Entrance and check in at the main information desk  You need to re-schedule your appointment should you arrive 10 or more minutes late.  We strive to give you quality time with our providers, and arriving late affects you and other patients whose appointments are after yours.  Also, if you no show three or more times for appointments you may be dismissed from the clinic at the providers discretion.     Again, thank you for choosing [facility].  Our hope is that these requests will decrease the amount of time that you wait before being seen by our physicians.       _____________________________________________________________  Should you have questions after your visit to [facility], please contact our office at [phone] between the hours of 8:00 a.m. and 4:30 p.m.  Voicemails left after 4:00 p.m. will not be returned until the following business day.  For prescription refill requests, have your pharmacy contact our office and allow 72 hours.    Cancer Center Support Programs:   > Cancer Support Group  2nd Tuesday of the month 1pm-2pm, Journey Room    

## 2018-11-11 NOTE — Assessment & Plan Note (Signed)
1.  Eosinophilia: - Elevated absolute eosinophil count of 5800 recorded on 12/04/2017.  Most recent dated absolute eosinophil count was 1400 on 01/06/2018.   -Elevated IgE level on 2 occasions of 850 and 588. - I have reviewed his high-resolution CT scan of the chest which did not reveal any pulmonary involvement.  He does not have any gastrointestinal symptoms. - FGFR1 and PDGFR beta on peripheral blood was negative.  Strongyloides antibody was negative.  LDH was in the upper limit of normal. - CT scan of the abdomen on 03/20/2018 shows spleen size in the upper limits of normal.  Echocardiogram shows ejection fraction of 55 to 60% with mild thickening of the leaflets.  He had normal tryptase levels and B12 was normal. - PDGFR A and FIP1L1 was negative.  Jak 2 mutation was also negative. - As there was no convincing evidence of primary eosinophilic disorders, I did not recommend bone marrow aspiration and biopsy. - His eosinophil count has been normal in the past few months. -He is currently on prednisone 5 mg alternating with 10 mg.  They were trying to taper it down.  I plan to repeat his CBC with differential in 4 months after complete taper of prednisone. -I will also obtain IgE levels at that time.  If these are normal, will consider PRN follow-up.  2.  Moderate persistent asthma: -He is continuing Nucala injections monthly.  He is also using prednisone along with inhalers.

## 2018-11-11 NOTE — Telephone Encounter (Signed)
Returned call to patient and made aware how the process works. Explained 14 day allowance to process. Also, being that providers are working out of hospital not sure if forms are sent hospital for signature or here. Apologized that I was not able to be more specific but during COVID-19 provider are currently covering hospital so unsure of process.  Nothing further needed at this time.

## 2018-11-12 ENCOUNTER — Ambulatory Visit (HOSPITAL_COMMUNITY): Payer: 59 | Admitting: Hematology

## 2018-11-23 ENCOUNTER — Telehealth: Payer: Self-pay | Admitting: Adult Health

## 2018-11-23 ENCOUNTER — Telehealth: Payer: Self-pay | Admitting: Emergency Medicine

## 2018-11-23 NOTE — Telephone Encounter (Signed)
Nucala Order: 100mg  #1 Vial Order Date: 11/23/2018 Expected date of arrival: 11/26/2018 Ordered by: Jaynee Eagles, CMA  Speciality [facility]: [facility]

## 2018-11-24 NOTE — Telephone Encounter (Signed)
Rec'd completed forms back from [place]. Fwd to [employer] via interoffice mail -pr

## 2018-11-24 NOTE — Telephone Encounter (Signed)
Form signed by TP and given back to [facility]

## 2018-11-24 NOTE — Telephone Encounter (Signed)
Forms received yesterday 5.11.2020 and placed in TP's to do folder Will route message to TP to make her aware

## 2018-11-26 NOTE — Telephone Encounter (Signed)
Nucala Shipment Received: 100mg  #1 vial Medication arrival date: 11/26/2018 Lot #: [id number] Exp date: 05/2020 Received by: Darlyne Russian

## 2018-12-03 ENCOUNTER — Other Ambulatory Visit: Payer: Self-pay

## 2018-12-03 ENCOUNTER — Ambulatory Visit (INDEPENDENT_AMBULATORY_CARE_PROVIDER_SITE_OTHER): Payer: 59

## 2018-12-03 DIAGNOSIS — J4550 Severe persistent asthma, uncomplicated: Secondary | ICD-10-CM | POA: Diagnosis not present

## 2018-12-03 MED ORDER — MEPOLIZUMAB 100 MG ~~LOC~~ SOLR
100.0000 mg | Freq: Once | SUBCUTANEOUS | Status: AC
  Administered 2018-12-03: 100 mg via SUBCUTANEOUS

## 2018-12-03 NOTE — Patient Instructions (Signed)
Ordering provider is also RB ([place]rum). I tried to pull it up and change it and couldn't. Can you change it on your end?  Thanks,  TBS

## 2018-12-03 NOTE — Progress Notes (Signed)
Have you been hospitalized within the last 10 days?  No Do you have a fever?  No Do you have a cough?  No Do you have a headache or sore throat? No  

## 2018-12-10 ENCOUNTER — Ambulatory Visit (INDEPENDENT_AMBULATORY_CARE_PROVIDER_SITE_OTHER): Payer: 59 | Admitting: Adult Health

## 2018-12-10 ENCOUNTER — Encounter: Payer: Self-pay | Admitting: Adult Health

## 2018-12-10 ENCOUNTER — Ambulatory Visit (INDEPENDENT_AMBULATORY_CARE_PROVIDER_SITE_OTHER): Payer: 59

## 2018-12-10 ENCOUNTER — Other Ambulatory Visit: Payer: Self-pay

## 2018-12-10 VITALS — BP 126/80 | HR 89 | Temp 97.8°F | Resp 18 | Ht 70.0 in | Wt 270.0 lb

## 2018-12-10 DIAGNOSIS — J4550 Severe persistent asthma, uncomplicated: Secondary | ICD-10-CM | POA: Diagnosis not present

## 2018-12-10 MED ORDER — METHYLPREDNISOLONE ACETATE 80 MG/ML IJ SUSP
120.0000 mg | Freq: Once | INTRAMUSCULAR | Status: AC
  Administered 2018-12-10: 120 mg via INTRAMUSCULAR

## 2018-12-10 MED ORDER — PREDNISONE 5 MG PO TABS: ORAL_TABLET | ORAL | 1 refills | Status: DC

## 2018-12-10 MED ORDER — PREDNISONE 10 MG PO TABS: ORAL_TABLET | ORAL | 0 refills | Status: DC

## 2018-12-10 NOTE — Progress Notes (Signed)
_0  ID: Jose Ross, male    DOB: [birth year], 48 y.o.   MRN: [id number]  Chief Complaint  Patient presents with  . Follow-up    Referring provider: No ref. provider found  HPI: 48 year old male former smoker seen for pulmonary consult for shortness of breath and wheezing on May 21, 2047fund to have significant allergic asthma  TEST/EVENTS :  5/23/19Exhaled nitric oxide testing today was very high at 152ppb. 11/2017 >Lab work showed very high eosinophil count. With eosinophils at 5800.IgE very high at 850. ANCA neg , Aspergillus panel neg Elevated absolute eosinophil count of 5800 recorded on 12/04/2017. absolute eosinophil count was 1400 on 01/06/2018.  -Elevated IgE level on 2 occasions of 850 and 588. -high-resolution CT scan of the chest which did not reveal any pulmonary involvement.  -FGFR1 and PDGFR beta on peripheral blood was negative.Strongyloides antibody was negative. LDH was in the upper limit of normal. -CT scan of the abdomen on 03/20/2018 shows spleen size in the upper limits of normal. Echocardiogram shows ejection fraction of 55 to 60% with mild thickening of the leaflets. He had normal tryptase levels and B12 was normal. -PDGFR A and FIP1L1wasnegative. Jak 2 mutation was also negative. -GI referralGI for possible eosinophilic esophagitis. His Nexium was increased to twice a day. They thought doing EGD and biopsy will be low yield given patient is on prednisone. Hematology referral Eosinophilia -workup as above , eosinophils improved so deferred Bone marrow biopsy ENT referral 2019  [facility] referral 2019 -Dr. MZigmund Daniel [facility] referral 08/2018    12/10/2018 Follow up : Severe persistent asthma, eosinophilia Patient returns for a one-month follow-up.  Patient has severe persistent asthma, allergic rhinitis hyper eosinophilia phenotype.  Patient has had an aggressive work-up for hyper eosinophilia.  Has been seen by  hematology.  There was no evidence of Churg-Strauss, eosinophilic granulomatosis, ABPA.  He has been on an aggressive regimen with Zyrtec, Flonase, Singulair, Nucala (began August 2019), Trelegy and chronic steroids baseline prednisone dose 5 to 10 mg.  Despite the aggressive regimen patient continues to have intermittent flareups and high symptom burden.  [facility]  Was recommended to change from Nucala to DJaybut cost factor was an issue. Since last visit patient says he was at his baseline which is intermittent wheezing dry cough and shortness of breath with activity.  Weight has went up 30 to 40 pounds over the last year.  Over the last several days patient says he has had increased cough wheezing and shortness of breath.  Has had increased postnasal drainage and increased mucus production with clear mucus.  He denies any fever hemoptysis orthopnea PND or leg swelling.  Chest x-ray today shows no acute process.     No Known Allergies   There is no immunization history on file for this patient.  Past Medical History:  Diagnosis Date  . Asthma   . Eosinophilia   . Pulmonary nodules     Tobacco History: Social History   Tobacco Use  Smoking Status Former Smoker  . Packs/day: 0.25  . Years: 10.00  . Pack years: 2.50  . Types: Cigarettes  . Last attempt to quit: 2000  . Years since quitting: 20.4  Smokeless Tobacco Never Used   Counseling given: Not Answered   Outpatient Medications Prior to Visit  Medication Sig Dispense Refill  . albuterol (PROVENTIL HFA;VENTOLIN HFA) 108 (90 Base) MCG/ACT inhaler Inhale 2 puffs into the lungs  every 4 (four) hours as needed for wheezing or shortness of breath (cough, shortness of breath or wheezing.). 1 Inhaler 5  . albuterol (PROVENTIL) (2.5 MG/3ML) 0.083% nebulizer solution Take 3 mLs (2.5 mg total) by nebulization every 4 (four) hours as needed for wheezing or shortness of breath.  150 mL 5  . EPINEPHrine 0.3 mg/0.3 mL IJ SOAJ injection Inject one dose intramuscularly for allergic reaction. May repeat one dose if needed after 5-15 minutes. Proceed to the ER  11  . Fluticasone-Umeclidin-Vilant 100-62.5-25 MCG/INH AEPB Inhale into the lungs.    . Mepolizumab 100 MG/ML SOAJ Inject 100 mg into the skin every 28 (twenty-eight) days. 1 mL 5  . montelukast (SINGULAIR) 10 MG tablet Take 1 tablet (10 mg total) by mouth at bedtime. 30 tablet 5  . omeprazole (PRILOSEC) 20 MG capsule Take 1 capsule (20 mg total) by mouth 2 (two) times daily before a meal. 60 capsule 1  . predniSONE (DELTASONE) 5 MG tablet Alternate 10mg and 5mg daily 45 tablet 1  . benzonatate (TESSALON) 200 MG capsule TAKE ONE CAPSULE BY MOUTH THREE TIMES DAILY AS NEEDED FOR COUGH (Patient not taking: Reported on 12/10/2018) 45 capsule 1   No facility-administered medications prior to visit.      Review of Systems:   Constitutional:   No  weight loss, night sweats,  Fevers, chills, fatigue, or  lassitude.  HEENT:   No headaches,  Difficulty swallowing,  Tooth/dental problems, or  Sore throat,                No sneezing, itching, ear ache,  +nasal congestion, post nasal drip,   CV:  No chest pain,  Orthopnea, PND, swelling in lower extremities, anasarca, dizziness, palpitations, syncope.   GI  No heartburn, indigestion, abdominal pain, nausea, vomiting, diarrhea, change in bowel habits, loss of appetite, bloody stools.   Resp:.  No chest wall deformity  Skin: no rash or lesions.  GU: no dysuria, change in color of urine, no urgency or frequency.  No flank pain, no hematuria   MS:  No joint pain or swelling.  No decreased range of motion.  No back pain.    Physical Exam  BP 126/80 (BP Location: Left Arm, Cuff Size: Large)   Pulse 89   Temp 97.8 F (36.6 C) (Oral)   Resp 18   Ht 5' 10" (1.778 m)   Wt 270 lb (122.5 kg)   SpO2 94%   BMI 38.74 kg/m   GEN: A/Ox3; pleasant , NAD, obese   HEENT:   [place]/AT,  EACs-clear, TMs-wnl, NOSE-clear, THROAT-clear, no lesions, no postnasal drip or exudate noted.   NECK:  Supple w/ fair ROM; no JVD; normal carotid impulses w/o bruits; no thyromegaly or nodules palpated; no lymphadenopathy.    RESP expiratory wheezing, speaks in full sentences ,no stridor  no accessory muscle use, no dullness to percussion  CARD:  RRR, no m/r/g, no peripheral edema, pulses intact, no cyanosis or clubbing.  GI:   Soft & nt; nml bowel sounds; no organomegaly or masses detected.   Musco: Warm bil, no deformities or joint swelling noted.   Neuro: alert, no focal deficits noted.    Skin: Warm, no lesions or rashes    Lab Results:  CBC    Component Value Date/Time   WBC 9.2 11/11/2018 0859   RBC 5.45 11/11/2018 0859   HGB 16.8 11/11/2018 0859   HCT 51.4 11/11/2018 0859   PLT 187 11/11/2018 0859   MCV 94.3   11/11/2018 0859   MCH 30.8 11/11/2018 0859   MCHC 32.7 11/11/2018 0859   RDW 12.5 11/11/2018 0859   LYMPHSABS 2.7 11/11/2018 0859   MONOABS 0.7 11/11/2018 0859   EOSABS 0.1 11/11/2018 0859   BASOSABS 0.1 11/11/2018 0859    BMET    Component Value Date/Time   NA 140 11/11/2018 0859   K 3.9 11/11/2018 0859   CL 103 11/11/2018 0859   CO2 26 11/11/2018 0859   GLUCOSE 102 (H) 11/11/2018 0859   BUN 16 11/11/2018 0859   CREATININE 1.17 11/11/2018 0859   CALCIUM 8.9 11/11/2018 0859   GFRNONAA >60 11/11/2018 0859   GFRAA >60 11/11/2018 0859    BNP No results found for: BNP  ProBNP    Component Value Date/Time   PROBNP 14.0 12/04/2017 1548    Imaging: No results found.  Mepolizumab SOLR 100 mg    Date Action Dose Route User   11/05/2018 1221 Given 100 mg Subcutaneous (Left Arm) Scott, Tammy B    Mepolizumab SOLR 100 mg    Date Action Dose Route User   12/03/2018 1142 Given 100 mg Subcutaneous (Right Arm) Scott, Tammy B      PFT Results Latest Ref Rng & Units 12/29/2017  FVC-Pre L 4.52  FVC-Predicted Pre % 86  FVC-Post L 4.93   FVC-Predicted Post % 94  Pre FEV1/FVC % % 74  Post FEV1/FCV % % 77  FEV1-Pre L 3.34  FEV1-Predicted Pre % 81  FEV1-Post L 3.78  DLCO UNC% % 95  DLCO COR %Predicted % 106  TLC L 6.39  TLC % Predicted % 90  RV % Predicted % 92    Lab Results  Component Value Date   NITRICOXIDE 17 12/12/2017        Assessment & Plan:   No problem-specific Assessment & Plan notes found for this encounter.     Tammy Parrett, NP 12/10/2018  

## 2018-12-10 NOTE — Assessment & Plan Note (Signed)
Wt cont to rise on steroids and lack of activity  Needs eval with PCP and monitor of BS as high risk for DM   Plan  Patient Instructions  Chest xray today .  Depo Medrol today  Increase Prednisone 40mg  daily for 2 days then 30mg  daily for 2 days and 20mg  daily for 2 days then 10mg  daily for 2 days then resume 10mg  alternating with 5mg  daily .  Continue on TRELEGY , 1 puff daily , rinse after use  Albuterol inhaler or nebs As needed  Wheezing .  NO MINTS .  Mucinex DM Twice daily  As needed  Cough/congestion  Saline nasal rinses Twice daily As needed   Send paperwork for Dupixent approval .  Continue on NUCALA for now . (until we can see if Dupixent is affordable) .  Continue on Prilosec 20mg  daily before meal .  Continue on Singulair 10mg  daily  Continue on Zyrtec 10mg  At bedtime  .  Flonase Nasal daily  Activity as tolerated. Advance as able  Work on [employer] .  Follow up with PCP for labs- monitor for blood sugars and weight gain (at risk for Diabetes) .  Follow up with Dr. Delton Coombes  Or Maksim Peregoy NP In 1 month and As needed   Please contact office for sooner follow up if symptoms do not improve or worsen or seek emergency care

## 2018-12-10 NOTE — Progress Notes (Signed)
Called spoke with patient, advised of cxr results / recs as stated by Parrett NP.  Pt verbalized understanding and denied any questions. 

## 2018-12-10 NOTE — Assessment & Plan Note (Signed)
Recurrent exacerbation - will try to change to Dupixent . Look at patient assitance programs, if not affordable will look at alternative options as has high sx burden on Nucala and unable to wean off steroids  tx w/ steroid burst and slow taper wean , hold at 10 and 5mg    Plan  Patient Instructions  Chest xray today .  Depo Medrol today  Increase Prednisone 40mg  daily for 2 days then 30mg  daily for 2 days and 20mg  daily for 2 days then 10mg  daily for 2 days then resume 10mg  alternating with 5mg  daily .  Continue on TRELEGY , 1 puff daily , rinse after use  Albuterol inhaler or nebs As needed  Wheezing .  NO MINTS .  Mucinex DM Twice daily  As needed  Cough/congestion  Saline nasal rinses Twice daily As needed   Send paperwork for Dupixent approval .  Continue on NUCALA for now . (until we can see if Dupixent is affordable) .  Continue on Prilosec 20mg  daily before meal .  Continue on Singulair 10mg  daily  Continue on Zyrtec 10mg  At bedtime  .  Flonase Nasal daily  Activity as tolerated. Advance as able  Work on [employer] .  Follow up with PCP for labs- monitor for blood sugars and weight gain (at risk for Diabetes) .  Follow up with Dr. Delton Coombes  Or Parrett NP In 1 month and As needed   Please contact office for sooner follow up if symptoms do not improve or worsen or seek emergency care

## 2018-12-10 NOTE — Patient Instructions (Addendum)
Chest xray today .  Depo Medrol today  Increase Prednisone 40mg  daily for 2 days then 30mg  daily for 2 days and 20mg  daily for 2 days then 10mg  daily for 2 days then resume 10mg  alternating with 5mg  daily .  Continue on TRELEGY , 1 puff daily , rinse after use  Albuterol inhaler or nebs As needed  Wheezing .  NO MINTS .  Mucinex DM Twice daily  As needed  Cough/congestion  Saline nasal rinses Twice daily As needed   Send paperwork for Dupixent approval .  Continue on NUCALA for now . (until we can see if Dupixent is affordable) .  Continue on Prilosec 20mg  daily before meal .  Continue on Singulair 10mg  daily  Continue on Zyrtec 10mg  At bedtime  .  Flonase Nasal daily  Activity as tolerated. Advance as able  Work on [employer] .  Follow up with PCP for labs- monitor for blood sugars and weight gain (at risk for Diabetes) .  Follow up with Dr. Delton Coombes  Or Danelia Snodgrass NP In 1 month and As needed   Please contact office for sooner follow up if symptoms do not improve or worsen or seek emergency care

## 2018-12-22 ENCOUNTER — Telehealth: Payer: Self-pay | Admitting: Adult Health

## 2018-12-22 ENCOUNTER — Telehealth: Payer: Self-pay | Admitting: Emergency Medicine

## 2018-12-22 NOTE — Telephone Encounter (Signed)
Received enrollments form for [place] from [place], [place]. These forms have been faxed to [place] [facility]. Will await summary of benefits.

## 2018-12-22 NOTE — Telephone Encounter (Signed)
 to set up shipment for pt's Nucala. Was advised that the pt has a large copay balance at this time. They are currently trying to reach the pt to take care of this. Shipment can't be placed until this balance is paid.

## 2018-12-25 NOTE — Telephone Encounter (Signed)
As of today I have not received the pt's summary of benefits. Called [employer] [facility] and spoke with April. States that they did receive the pt's enrollment form and she not quite sure why we didn't get a summary of benefits. She is going to refax that to my attention. Will await fax.

## 2018-12-29 ENCOUNTER — Telehealth: Payer: Self-pay | Admitting: Adult Health

## 2018-12-29 NOTE — Telephone Encounter (Signed)
As of today, I have still not received the pt's summary of benefits. Called [employer] [facility] at [phone]. They had the incorrect fax number for our office. Summary of benefits is being faxed to the correct number. Will await fax.

## 2018-12-29 NOTE — Telephone Encounter (Signed)
Summary of benefits has been received. There is a PA already on file with the pt's insurance that has been approved 08/21/2018 - 02/19/2019. PA approval # is [id number]. Rx for Dupixent will need to be sent to [facility]) per the pt's summary of benefits. Dupixent has a $300 copay for a 30 day supply, up to a $2500 out of pocket max, once this is met Dupixent is covered at 100%.  Pt is currently on Nucala and gets this through [facility]). I attempted to order this medication last week through [facility]. I was told that the pt could not have any medication ordered or shipped until he settled his debt with them.  LMTCB x1 for pt.

## 2018-12-30 NOTE — Telephone Encounter (Signed)
Rec'd completed disability form - fwd to [employer] via interoffice mail -pr  °

## 2018-12-30 NOTE — Telephone Encounter (Signed)
Paperwork has been completed and turned back in to [place].

## 2018-12-31 ENCOUNTER — Ambulatory Visit: Payer: 59

## 2019-01-01 ENCOUNTER — Ambulatory Visit: Payer: 59

## 2019-01-01 ENCOUNTER — Telehealth: Payer: Self-pay | Admitting: Adult Health

## 2019-01-01 NOTE — Telephone Encounter (Signed)
Pt returned call regarding his disability forms. Pt states he received a call from [employer] stating that two check boxes in the paperwork, pt states as "out until further notice" and "out until next appointment with provider" had both been check and that only one of those boxes were supposed to be check. Pt states [employer] faxed the form back to [employer] 12/31/2018. I checked RB's mail and incoming mail, did not find any forms.   I let pt know of this and that RB will be in office on Monday 01/04/2019 to take care of this. Pt stated he needed the forms done as soon as possible.  Routing to [employer] for f/u. Thank you.

## 2019-01-01 NOTE — Telephone Encounter (Addendum)
ATC pt, no answer. Left message for pt to call back.  According to previous message, the disability forms were sent back to [employer] on 12/30/2018 per Cherina. The papers went interoffice mail back to [employer].

## 2019-01-04 NOTE — Telephone Encounter (Signed)
Forms were completed by Tammy Parrett. Message will redirected to her.

## 2019-01-05 NOTE — Telephone Encounter (Signed)
Contacted Vern at [facility] to follow up on form.  She states she cannot find any outstanding paperwork needing completion.  Forms were completed and faxed in her notation in mid June.  She does not know of anything further that needs to be done.  Will close encounter.

## 2019-01-06 NOTE — Telephone Encounter (Signed)
LMTCB x2 for pt 

## 2019-01-08 ENCOUNTER — Encounter: Payer: Self-pay | Admitting: Adult Health

## 2019-01-08 ENCOUNTER — Other Ambulatory Visit: Payer: Self-pay

## 2019-01-08 ENCOUNTER — Ambulatory Visit: Payer: 59 | Admitting: Adult Health

## 2019-01-08 DIAGNOSIS — J4551 Severe persistent asthma with (acute) exacerbation: Secondary | ICD-10-CM

## 2019-01-08 DIAGNOSIS — J310 Chronic rhinitis: Secondary | ICD-10-CM | POA: Diagnosis not present

## 2019-01-08 MED ORDER — PREDNISONE 10 MG PO TABS: ORAL_TABLET | ORAL | 1 refills | Status: DC

## 2019-01-08 NOTE — Assessment & Plan Note (Signed)
Severe refractory asthma with frequent exacerbations and high symptom burden.  Patient continues to have significant wheezing today despite aggressive therapy.  Have advised him on recommendations for hospitalization for further evaluation and treatment options.  Patient declines hospitalization admission. He is currently stable with O2 saturations 98% on room air.  No stridor.  Eating and drinking well.  Able to ambulate without difficulty. We will place him on a steroid burst and an increased baseline steroids at prednisone 20 mg. He will begin Dupixent as it is recently been approved by his insurance company.  If he is improved next week can begin on 01/13/19 if available  Refer back to ENT to see if needs further evaluation of chronic sinusitis   Plan  Patient Instructions  Increase Prednisone 40mg  daily for 5 days then 30mg  daily for 5 days and 20mg  daily .  Continue on TRELEGY , 1 puff daily , rinse after use  Albuterol inhaler or nebs As needed  Wheezing .  NO MINTS .  Mucinex DM Twice daily  As needed  Cough/congestion  Saline nasal rinses Twice daily As needed   Begin Dupixent next week if available and improved from asthma flare  Continue on Prilosec 20mg  daily before meal .  Continue on Singulair 10mg  daily  Continue on Zyrtec 10mg  At bedtime  .  Flonase Nasal daily  Activity as tolerated. Advance as able  Work on [employer] .  Follow up with ENT  Referral for PCP for labs- monitor for blood sugars and weight gain (at risk for Diabetes) . - Follow up with Dr. Lamonte Sakai  Or Parrett NP In 2 weeks and As needed   Please contact office for sooner follow up if symptoms do not improve or worsen or seek emergency care

## 2019-01-08 NOTE — Progress Notes (Signed)
_0  ID: Jose Ross, male    DOB: 09-Mar-1971, 48 y.o.   MRN: [id number]  Chief Complaint  Patient presents with  . Acute Visit    Asthma     Referring provider: No ref. provider found  HPI: 48 year old male former smoker seen for pulmonary consult for shortness of breath and wheezing on May 21, 2049fund to have significant allergic asthma   TEST/EVENTS :  5/23/19Exhaled nitric oxide testing today was very high at 152ppb. 11/2017 >Lab work showed very high eosinophil count. With eosinophils at 5800.IgE very high at 850. ANCA neg , Aspergillus panel neg Elevated absolute eosinophil count of 5800 recorded on 12/04/2017. absolute eosinophil count was 1400 on 01/06/2018.  -Elevated IgE level on 2 occasions of 850 and 588. -high-resolution CT scan of the chest which did not reveal any pulmonary involvement.  -FGFR1 and PDGFR beta on peripheral blood was negative.Strongyloides antibody was negative. LDH was in the upper limit of normal. -CT scan of the abdomen on 03/20/2018 shows spleen size in the upper limits of normal. Echocardiogram shows ejection fraction of 55 to 60% with mild thickening of the leaflets. He had normal tryptase levels and B12 was normal. -PDGFR A and FIP1L1wasnegative. Jak 2 mutation was also negative. -GI referralGI for possible eosinophilic esophagitis. His Nexium was increased to twice a day. They thought doing EGD and biopsy will be low yield given patient is on prednisone. Hematology referral Eosinophilia -workup as above , eosinophils improved so deferred Bone marrow biopsy ENT referral 2019  [facility] referral 2019 -Dr. MZigmund Daniel [facility] referral 08/2018  01/08/2019 Follow up : Severe persistent Asthma , Eosinophilia  Patient presents for a one-month follow-up.  Patient has severe refractory asthma, allergic rhinitis with hyper eosinophilia phenotype.  Patient is underwent an aggressive work-up for hypereosinophilia  Has been seen by hematology.  There was no evidence of Churg-Strauss, eosinophilic granulomatosis, ABPA.  He is on multiple medications including Zyrtec, Flonase, Singulair, Nucala (began August 2019), Trelegy, chronic steroids baseline prednisone dose 5 to 10 mg.  Despite all this he has recurrent exacerbations requiring frequent steroid burst.  And has high symptom burden.  [facility].  Was [place] has been an ongoing issue.  He has recently been approved for [place]atherpatient was seen last visit with an exacerbation.  Chest x-ray showed no acute process.  Was given a Depo-Medrol injection.  Increase prednisone to 40 mg with a 2-day taper.  He said he had a short couple days where he felt somewhat better but never seen any significant improvement continues to have ongoing wheezing shortness of breath.  Denies any fever or discolored mucus chest pain orthopnea PND or increased leg swelling.  No Known Allergies   There is no immunization history on file for this patient.  Past Medical History:  Diagnosis Date  . Asthma   . Eosinophilia   . Pulmonary nodules     Tobacco History: Social History   Tobacco Use  Smoking Status Former Smoker  . Packs/day: 0.25  . Years: 10.00  . Pack years: 2.50  . Types: Cigarettes  . Quit date: 2000  . Years since quitting: 20.4  Smokeless Tobacco Never Used   Counseling given: Not Answered   Outpatient Medications Prior to Visit  Medication Sig Dispense Refill  . albuterol (PROVENTIL HFA;VENTOLIN HFA) 108 (90 Base) MCG/ACT inhaler Inhale 2 puffs into the lungs every 4 (  four) hours as needed for wheezing or shortness of breath (cough, shortness of breath or wheezing.). 1 Inhaler 5  . albuterol (PROVENTIL) (2.5 MG/3ML) 0.083% nebulizer solution Take 3 mLs (2.5 mg total) by nebulization every 4 (four) hours as needed for wheezing or shortness of breath.  150 mL 5  . EPINEPHrine 0.3 mg/0.3 mL IJ SOAJ injection Inject one dose intramuscularly for allergic reaction. May repeat one dose if needed after 5-15 minutes. Proceed to the ER  11  . Fluticasone-Umeclidin-Vilant 100-62.5-25 MCG/INH AEPB Inhale into the lungs.    . Mepolizumab 100 MG/ML SOAJ Inject 100 mg into the skin every 28 (twenty-eight) days. 1 mL 5  . montelukast (SINGULAIR) 10 MG tablet Take 1 tablet (10 mg total) by mouth at bedtime. 30 tablet 5  . omeprazole (PRILOSEC) 20 MG capsule Take 1 capsule (20 mg total) by mouth 2 (two) times daily before a meal. 60 capsule 1  . predniSONE (DELTASONE) 10 MG tablet 4 tabs for 2 days, then 3 tabs for 2 days, 2 tabs for 2 days, then 1 tab for 2 days then alternating 10/550m tabs daily 20 tablet 0  . predniSONE (DELTASONE) 5 MG tablet Alternate 1350mand 50m24maily 45 tablet 1   No facility-administered medications prior to visit.      Review of Systems:   Constitutional:   No  weight loss, night sweats,  Fevers, chills, fatigue, or  lassitude.  HEENT:   No headaches,  Difficulty swallowing,  Tooth/dental problems, or  Sore throat,                No sneezing, itching, ear ache, nasal congestion, post nasal drip,   CV:  No chest pain,  Orthopnea, PND, swelling in lower extremities, anasarca, dizziness, palpitations, syncope.   GI  No heartburn, indigestion, abdominal pain, nausea, vomiting, diarrhea, change in bowel habits, loss of appetite, bloody stools.   Resp: No shortness of breath with exertion or at rest.  No excess mucus, no productive cough,  No non-productive cough,  No coughing up of blood.  No change in color of mucus.  No wheezing.  No chest wall deformity  Skin: no rash or lesions.  GU: no dysuria, change in color of urine, no urgency or frequency.  No flank pain, no hematuria   MS:  No joint pain or swelling.  No decreased range of motion.  No back pain.    Physical Exam  BP 124/80 (BP Location: Left Arm, Cuff Size:  Normal)   Pulse 94   Temp 97.6 F (36.4 C) (Oral)   Ht _0  (1.778 m)   Wt 265 lb 6.4 oz (120.4 kg)   SpO2 98%   BMI 38.08 kg/m   GEN: A/Ox3; pleasant , NAD, obese   HEENT:  [place]/AT,  EACs-clear, TMs-wnl, NOSE-clear, THROAT-clear, no lesions, no postnasal drip or exudate noted.   NECK:  Supple w/ fair ROM; no JVD; normal carotid impulses w/o bruits; no thyromegaly or nodules palpated; no lymphadenopathy.    RESP expiratory wheezing , speaks in full sentences  no accessory muscle use, no dullness to percussion  CARD:  RRR, no m/r/g, no peripheral edema, pulses intact, no cyanosis or clubbing.  GI:   Soft & nt; nml bowel sounds; no organomegaly or masses detected.   Musco: Warm bil, no deformities or joint swelling noted.   Neuro: alert, no focal deficits noted.    Skin: Warm, no lesions or rashes    Lab Results:  CBC  Component Value Date/Time   WBC 9.2 11/11/2018 0859   RBC 5.45 11/11/2018 0859   HGB 16.8 11/11/2018 0859   HCT 51.4 11/11/2018 0859   PLT 187 11/11/2018 0859   MCV 94.3 11/11/2018 0859   MCH 30.8 11/11/2018 0859   MCHC 32.7 11/11/2018 0859   RDW 12.5 11/11/2018 0859   LYMPHSABS 2.7 11/11/2018 0859   MONOABS 0.7 11/11/2018 0859   EOSABS 0.1 11/11/2018 0859   BASOSABS 0.1 11/11/2018 0859    BMET    Component Value Date/Time   NA 140 11/11/2018 0859   K 3.9 11/11/2018 0859   CL 103 11/11/2018 0859   CO2 26 11/11/2018 0859   GLUCOSE 102 (H) 11/11/2018 0859   BUN 16 11/11/2018 0859   CREATININE 1.17 11/11/2018 0859   CALCIUM 8.9 11/11/2018 0859   GFRNONAA >60 11/11/2018 0859   GFRAA >60 11/11/2018 0859    BNP No results found for: BNP  ProBNP    Component Value Date/Time   PROBNP 14.0 12/04/2017 1548    Imaging: Dg Chest 2 View  Result Date: 12/10/2018 CLINICAL DATA:  Asthma, wheezing EXAM: CHEST - 2 VIEW COMPARISON:  CT chest 01/01/2018, chest x-ray 04/02/2018 FINDINGS: The heart size and mediastinal contours are within normal  limits. Both lungs are clear. The visualized skeletal structures are unremarkable. IMPRESSION: No active cardiopulmonary disease. Electronically Signed   By: Kathreen Devoid   On: 12/10/2018 12:47    Mepolizumab SOLR 100 mg    Date Action Dose Route User   12/03/2018 1142 Given 100 mg Subcutaneous (Right Arm) Nicki Reaper, Jacorion Klem B    methylPREDNISolone acetate (DEPO-MEDROL) injection 120 mg    Date Action Dose Route User   12/10/2018 1230 Given 120 mg Intramuscular (Right Upper Outer Quadrant) Rinaldo Ratel, CMA      PFT Results Latest Ref Rng & Units 12/29/2017  FVC-Pre L 4.52  FVC-Predicted Pre % 86  FVC-Post L 4.93  FVC-Predicted Post % 94  Pre FEV1/FVC % % 74  Post FEV1/FCV % % 77  FEV1-Pre L 3.34  FEV1-Predicted Pre % 81  FEV1-Post L 3.78  DLCO UNC% % 95  DLCO COR %Predicted % 106  TLC L 6.39  TLC % Predicted % 90  RV % Predicted % 92    Lab Results  Component Value Date   NITRICOXIDE 17 12/12/2017        Assessment & Plan:   Asthma Severe refractory asthma with frequent exacerbations and high symptom burden.  Patient continues to have significant wheezing today despite aggressive therapy.  Have advised him on recommendations for hospitalization for further evaluation and treatment options.  Patient declines hospitalization admission. He is currently stable with O2 saturations 98% on room air.  No stridor.  Eating and drinking well.  Able to ambulate without difficulty. We will place him on a steroid burst and an increased baseline steroids at prednisone 20 mg. He will begin Dupixent as it is recently been approved by his insurance company.  If he is improved next week can begin on 01/13/19 if available  Refer back to ENT to see if needs further evaluation of chronic sinusitis   Plan  Patient Instructions  Increase Prednisone 4m daily for 5 days then 33mdaily for 5 days and 2038maily .  Continue on TRELEGY , 1 puff daily , rinse after use  Albuterol inhaler or nebs  As needed  Wheezing .  NO MINTS .  Mucinex DM Twice daily  As needed  Cough/congestion  Saline nasal rinses Twice daily As needed   Begin Dupixent next week if available and improved from asthma flare  Continue on Prilosec 13m daily before meal .  Continue on Singulair 136mdaily  Continue on Zyrtec 1061mt bedtime  .  Flonase Nasal daily  Activity as tolerated. Advance as able  Work on [employer] Follow up with ENT  Referral for PCP for labs- monitor for blood sugars and weight gain (at risk for Diabetes) . - Follow up with Dr. ByrLamonte Sakair Alezandra Egli NP In 2 weeks and As needed   Please contact office for sooner follow up if symptoms do not improve or worsen or seek emergency care       Chronic rhinitis Refer back to ENT      TamRexene EdisonP 01/08/2019

## 2019-01-08 NOTE — Assessment & Plan Note (Signed)
Refer back to ENT 

## 2019-01-08 NOTE — Patient Instructions (Addendum)
Increase Prednisone 40mg  daily for 5 days then 30mg  daily for 5 days and 20mg  daily .  Continue on TRELEGY , 1 puff daily , rinse after use  Albuterol inhaler or nebs As needed  Wheezing .  NO MINTS .  Mucinex DM Twice daily  As needed  Cough/congestion  Saline nasal rinses Twice daily As needed   Begin Dupixent next week if available and improved from asthma flare  Continue on Prilosec 20mg  daily before meal .  Continue on Singulair 10mg  daily  Continue on Zyrtec 10mg  At bedtime  .  Flonase Nasal daily  Activity as tolerated. Advance as able  Work on [employer] .  Follow up with ENT  Referral for PCP for labs- monitor for blood sugars and weight gain (at risk for Diabetes) . - Follow up with Dr. Lamonte Sakai  Or Matricia Begnaud NP In 2 weeks and As needed   Please contact office for sooner follow up if symptoms do not improve or worsen or seek emergency care

## 2019-01-12 ENCOUNTER — Telehealth: Payer: Self-pay | Admitting: Adult Health

## 2019-01-13 NOTE — Telephone Encounter (Signed)
See telephone encounter 01/12/2019

## 2019-01-13 NOTE — Telephone Encounter (Signed)
[place]. Spoke with Montine Circle.  Dupixent Order: 300mg  #1 prefilled syringe Ordered Date: 01/13/2019 Expected date of arrival: 01/14/2019 Ordered by: Desmond Dike, [place]: [facility]  Pt will need to be contacted to set up appointment once the medication is received. He will also need to know about the office protocol for first time injections.

## 2019-01-14 ENCOUNTER — Ambulatory Visit: Payer: 59 | Admitting: Emergency Medicine

## 2019-01-14 ENCOUNTER — Telehealth: Payer: Self-pay | Admitting: *Deleted

## 2019-01-14 NOTE — Telephone Encounter (Signed)
Called spoke with patient to schedule first Dupixent injection.   Patient is scheduled for 01/18/2019 at 10:00.   Per TBS phone note, patient's medication has already been received. Does the patient have the Epipen from the pharmacy? Yes If no, route message to provider/nurse for Rx.  Nothing further needed at this time; will sign off.

## 2019-01-14 NOTE — Telephone Encounter (Signed)
Dupixent Shipment Received: 300mg  #1 prefilled syringe Medication arrival date: 01/14/2019 Lot #: [id number] Exp date: 02/2021 Received by: Laurette Schimke

## 2019-01-18 ENCOUNTER — Other Ambulatory Visit: Payer: Self-pay

## 2019-01-18 ENCOUNTER — Ambulatory Visit (INDEPENDENT_AMBULATORY_CARE_PROVIDER_SITE_OTHER): Payer: Self-pay

## 2019-01-18 DIAGNOSIS — J4551 Severe persistent asthma with (acute) exacerbation: Secondary | ICD-10-CM

## 2019-01-18 MED ORDER — DUPILUMAB 300 MG/2ML ~~LOC~~ SOSY
600.0000 mg | PREFILLED_SYRINGE | Freq: Once | SUBCUTANEOUS | Status: AC
  Administered 2019-01-18: 600 mg via SUBCUTANEOUS

## 2019-01-18 NOTE — Progress Notes (Signed)
  Evaluation:Have you been hospitalized within the last 10 days?  No Do you have a fever?  No Do you have a cough?  Yes usual cough Do you have a headache or sore throat? No   Patient presented to the office today for first-time Dupixent injection.  Primary Pulmonologist: Baltazar Apo Medication name: Dupixent Strength: 600mg  Site(s): Lt & Rt arm  Epi pen/Auvi-Q visible during appointment: Yes  Time of injection: 10:17  Patient evaluated every 15-20 minutes per protocol x2 hours.  1st check: 10:44AM Evaluation: No reaction or sx  2nd check: 11:07 AM Evaluation: No sx or reactions  3rd check: 11:17 AM 1 hr mark Evaluation: No reactions or sx.  4th check: 11:37 AM Evaluation: No sx or reactions.  5th check: 11:57AM Evaluation: No reactions or sx.  6th check: 12:17PM Evaluation: No sx or reactions.

## 2019-01-18 NOTE — Patient Instructions (Signed)
Pt's 1st time getting Dupixent. 600mg =33mLx2 (4) One in ea arm.

## 2019-01-22 ENCOUNTER — Telehealth: Payer: Self-pay | Admitting: Emergency Medicine

## 2019-01-22 NOTE — Telephone Encounter (Signed)
Called [facility] to set up pt's shipment of [place]. They need to the pt's consent to ship. Once they get this, they will call back to set up a shipment date.

## 2019-01-25 ENCOUNTER — Other Ambulatory Visit: Payer: Self-pay

## 2019-01-25 ENCOUNTER — Ambulatory Visit: Payer: 59 | Admitting: Emergency Medicine

## 2019-01-25 ENCOUNTER — Encounter: Payer: Self-pay | Admitting: Emergency Medicine

## 2019-01-25 DIAGNOSIS — J4551 Severe persistent asthma with (acute) exacerbation: Secondary | ICD-10-CM

## 2019-01-25 NOTE — Assessment & Plan Note (Signed)
Please continue prednisone 10 mg until you have your next Dupixent dose.  The next day you can decrease to 5 mg daily.  Continue this dose for 10 days and if you continue to well stop at that time. Continue Trelegy as you have been taking it Continue Singulair as you have been taking it Continue omeprazole as you have been taking it Keep albuterol available use 2 puffs when you needed for shortness of breath, chest tightness, wheezing. If you continue to see this degree of clinical improvement on the Dupixent then I support your goal to go back to work. Follow with a telephone visit in 2 weeks with Dr Lamonte Sakai or T Parrett.

## 2019-01-25 NOTE — Progress Notes (Signed)
Subjective:    Patient ID: Jose Ross, male    DOB: 07/18/1970, 48 y.o.   MRN: [id number]  Asthma He complains of cough, shortness of breath and wheezing. Associated symptoms include headaches and sneezing. Pertinent negatives include no ear pain, fever, postnasal drip, rhinorrhea, sore throat or trouble swallowing. His past medical history is significant for asthma.  Shortness of Breath Associated symptoms include headaches and wheezing. Pertinent negatives include no ear pain, fever, leg swelling, rash, rhinorrhea, sore throat or vomiting. His past medical history is significant for asthma.   ROV 03/09/18 --Jose Ross presents today for follow-up.  He is 48 years old with a minimal tobacco history (15 pack years), history of contact dermatitis.  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  Spirometry confirms mild obstruction with a positive bronchodilator response.  He has responded clinically to prednisone, benefits some from albuterol.  Evaluation has included an unrevealing CT scan of the chest with a negative ANCA making Churg-Strauss or eosinophilic granulomatosis unlikely.  Likewise he has had negative aspergillus IgE testing effectively ruling out ABPA.  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  I sent him for allergy evaluation, has seen Dr Dellis AnesGallagher, and surprisingly his skin testing was negative.  In light of this he was referred to hematology/oncology for further evaluation of his profound eosinophilia.  No evidence for parasitic infection on serological testing (or clinically).  Genetic testing for possible hypereosinophilic syndrome, myeloproliferative disorders has been sent.  Tryptase level reassuring without any evidence for mastocytosis. He is being considered for BM biopsy depending on where the workup  leads.   He does have persistent asthma, but there is now evidence that is may be secondary to some other primary eosinophilic process. I still believe it is appropriate to use Nucala, first dose was 02/12/18. He was treated with another pred taper, is currently off prednisone. When he came off anti-histamines for allergy testing he had significant patchy skin changes, is now back on zyrtec, singulair, on Dulera. He uses albuterol about 4x a day, does note a significant clinical change in breathing when he uses it. His wheeze appears to be better, still has severe exertional SOB.   ROV 04/09/18 --this is a follow-up visit for severe persistent asthma in the setting of hyper eosinophilia and an elevated IgE.  He has mild obstruction on his pulmonary function testing.  I evaluated him for possible ABPA, Churg-Strauss or eosinophilic granulomatosis without any evidence of any.  He has continued to be symptomatic even after starting on Nucala, he has had 2 doses.  His work-up for his hypereosinophilia has included cytogenetics which were normal. We started him on prednisone 20mg  last week because he was having persistent wheeze, cough, dyspnea. He is on nexium qd. Remains on singulair, Dulera, using albuterol several times a day. On zyrtec.   ROV 05/12/18 --[name] follows up for severe persistent asthma, hyper-eosinophilia (reassuring cytogenetic work-up, BM Bx deferred for now), elevated IgE.  His obstruction on PFT is mild but his symptoms are persistent and very disruptive. Allergy w/u without any specific antigens to target.  He has been unable to work.  No evidence to date of Churg-Strauss, eosinophilic granulomatosis, ABPA.  He is now on Nucala and has completed 4 doses. Since last visit he was treated w abx for a possible acute sinusitis. He reports that  his breathing may be a bit better, but still freq albuterol use, freq cough and wheeze. He is on zyrtec - minimal response to nasal steroid.   ROV  07/13/18 --this a follow-up visit for moderate persistent asthma with allergic rhinitis, history of dermatitis and a hypereosinophilic phenotype with an elevated IgE.  Work-up as described above.  He has required chronic steroids, is on Nucala. No side effects. He isn't sure that it is making an impact.  Referred him to [facility] and his pred was increased to 40mg , then tapered. He is currently on pred 20mg . Has been seen by ENT as well, treated for chronic sinusitis, still dealing with eye drainage. Maintenance is now Trelegy. Using his albuterol 3-4x a day. He is going to see allergy at [facility]. He is on short term disability - is unable to do his job as things stand.   ROV 09/15/2018 --Jose Ross is 48, returns today for further evaluation and care for his persistent asthma, allergic rhinitis with an associated hypereosinophilic phenotype and elevated IgE.  He is also been evaluated for systemic hypereosinophilia, evaluation reassuring to date.  No evidence of Churg-Strauss, eosinophilic granulomatosis, ABPA.  We have managed him on aggressive antihistamine and Nasal steroid therapy, leukotriene antagonist.  Skin testing was surprisingly negative, not on immunotherapy.  He has been seen by Dr. Erik Obey with ENT, treated for possible acute/chronic sinusitis, imaging confirms pansinusitis. Considering sinus surgery.  Bronchodilators changed to Trelegy which he is tolerating.  Remains on prednisone 10 mg daily (last 2 months), Nucala.  Plans are in place for him to try a change to [place].he is concerned about the cost > it will cost him $300 more a month.   ROV 09/29/2018 --Jose Ross follows up today for his persistent asthma, allergic rhinitis and hypereosinophilic phenotype.  As detailed above we decreased his prednisone to 5 mg daily 2 weeks ago.  He has continued Nucala (unclear the Dupixent is going to be feasible due to cost, initiated by [facility]) but he is 10 days late because apparently it fell off of his med list - I believe  we refilled this 2 weeks ago. I still need to speak with Dr Erik Obey regarding the role of his sinus disease in his asthma management. He is having more wheeze, more tightness and SOB.   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  He also has allergic rhinitis.  We had managed him with Nucala but he had persistent symptoms, was still requiring bursts of prednisone.  He has since been approved for and is changed from [place] to [place].  He has received one dose >> feels remarkably better. Breathing, energy, wheeze are all better.   His other maintenance regimen includes Trelegy, Singulair, Prilosec.  He is using albuterol approximately once a week now.  His current maintenance prednisone dose is 10mg , has been on this for 2 days.    Review of Systems  Constitutional: Negative for fever and unexpected weight change.  HENT: Positive for congestion, nosebleeds, sinus pressure and sneezing. Negative for dental problem, ear pain, postnasal drip, rhinorrhea, sore throat and trouble swallowing.   Eyes: Negative for redness and itching.  Respiratory: Positive for cough, chest tightness, shortness of breath and wheezing.   Cardiovascular: Negative for palpitations and leg swelling.  Gastrointestinal: Negative for nausea and vomiting.  Genitourinary: Negative for dysuria.  Musculoskeletal: Negative for joint swelling.  Skin: Negative for rash.  Allergic/Immunologic: Positive for environmental allergies. Negative  for food allergies and immunocompromised state.  Neurological: Positive for headaches.  Hematological: Does not bruise/bleed easily.  Psychiatric/Behavioral: Negative for dysphoric mood. The patient is not nervous/anxious.         Objective:   Physical Exam Vitals:   01/25/19 1348  BP: 130/84  Pulse: 87  SpO2: 99%  Weight: 267 lb (121.1 kg)  Height: 5\' 10"  (1.778 m)   Gen:  Pleasant, well-nourished, mild resp distress,  normal affect, developing some cushingoid facies, gaining wt  ENT: No lesions,  mouth clear,  oropharynx clear but crowded   Neck: No JVD, no stridor   Lungs: No use of accessory muscles, more coarse today on both insp and exp, few exp wheezes   Cardiovascular: RRR, heart sounds normal, no murmur or gallops, no peripheral edema  Musculoskeletal: No deformities, no cyanosis or clubbing  Neuro: alert, non focal  Skin: Warm, no lesions or rash     Assessment & Plan:  Asthma Please continue prednisone 10 mg until you have your next Dupixent dose.  The next day you can decrease to 5 mg daily.  Continue this dose for 10 days and if you continue to well stop at that time. Continue Trelegy as you have been taking it Continue Singulair as you have been taking it Continue omeprazole as you have been taking it Keep albuterol available use 2 puffs when you needed for shortness of breath, chest tightness, wheezing. If you continue to see this degree of clinical improvement on the Dupixent then I support your goal to go back to work. Follow with a telephone visit in 2 weeks with Dr Delton CoombesByrum or T Parrett.   Levy Pupaobert Cecely Rengel, MD, PhD 01/25/2019, 2:04 PM [facility] [phone] or if no answer [phone]

## 2019-01-25 NOTE — Patient Instructions (Addendum)
Please continue prednisone 10 mg until you have your next Dupixent dose.  The next day you can decrease to 5 mg daily.  Continue this dose for 10 days and if you continue to well stop at that time. Continue Trelegy as you have been taking it Continue Singulair as you have been taking it Continue omeprazole as you have been taking it Keep albuterol available use 2 puffs when you needed for shortness of breath, chest tightness, wheezing. If you continue to see this degree of clinical improvement on the Dupixent then I support your goal to go back to work. Follow with a telephone visit in 2 weeks with Dr Wah Sabic or T Parrett.  

## 2019-01-27 ENCOUNTER — Telehealth: Payer: Self-pay | Admitting: Adult Health

## 2019-01-27 NOTE — Telephone Encounter (Signed)
Called [employer] to follow up on this shipment. The shipment has been set up and we should receive this package on 01/28/2019.

## 2019-01-27 NOTE — Telephone Encounter (Signed)
Noted  

## 2019-01-28 ENCOUNTER — Telehealth: Payer: Self-pay | Admitting: Emergency Medicine

## 2019-01-28 NOTE — Telephone Encounter (Signed)
Pt's OV has been faxed to Caitlyn at [facility] at the provided fax number. Received confirmation that the fax went through. Nothing further needed.

## 2019-01-28 NOTE — Telephone Encounter (Signed)
Dupixent Shipment Received: 300mg  #2 prefilled syringe Medication arrival date: 01/28/2019 Lot #: [id number] Exp date: 05/2021 Received by: Desmond Dike, [place]

## 2019-02-01 ENCOUNTER — Other Ambulatory Visit: Payer: Self-pay

## 2019-02-01 ENCOUNTER — Ambulatory Visit (INDEPENDENT_AMBULATORY_CARE_PROVIDER_SITE_OTHER): Payer: 59

## 2019-02-01 DIAGNOSIS — J4551 Severe persistent asthma with (acute) exacerbation: Secondary | ICD-10-CM

## 2019-02-01 MED ORDER — DUPILUMAB 300 MG/2ML ~~LOC~~ SOSY
300.0000 mg | PREFILLED_SYRINGE | Freq: Once | SUBCUTANEOUS | Status: AC
  Administered 2019-02-01: 300 mg via SUBCUTANEOUS

## 2019-02-01 NOTE — Progress Notes (Signed)
All questions were answered by the patient before medication was administered. Have you been hospitalized in the last 10 days? No Do you have a fever? No Do you have a cough? No Do you have a headache or sore throat? No   Patient waited 20 minutes due to 2nd injection.  No redness at site and patient denied any other symptoms such as itching or shortness of breath.

## 2019-02-08 ENCOUNTER — Telehealth: Payer: Self-pay | Admitting: Emergency Medicine

## 2019-02-08 NOTE — Telephone Encounter (Signed)
RB needs to know when the pt would like to go back to work. LMTCB x1 for pt.

## 2019-02-09 ENCOUNTER — Ambulatory Visit (INDEPENDENT_AMBULATORY_CARE_PROVIDER_SITE_OTHER): Payer: 59 | Admitting: Adult Health

## 2019-02-09 ENCOUNTER — Encounter: Payer: Self-pay | Admitting: Adult Health

## 2019-02-09 ENCOUNTER — Other Ambulatory Visit: Payer: Self-pay

## 2019-02-09 DIAGNOSIS — J329 Chronic sinusitis, unspecified: Secondary | ICD-10-CM | POA: Diagnosis not present

## 2019-02-09 DIAGNOSIS — J310 Chronic rhinitis: Secondary | ICD-10-CM | POA: Diagnosis not present

## 2019-02-09 DIAGNOSIS — J4551 Severe persistent asthma with (acute) exacerbation: Secondary | ICD-10-CM

## 2019-02-09 DIAGNOSIS — D721 Eosinophilia, unspecified: Secondary | ICD-10-CM

## 2019-02-09 NOTE — Progress Notes (Signed)
Virtual Visit via Telephone Note  I connected with Jose Ross on 02/09/19 at 10:00 AM EDT by telephone and verified that I am speaking with the correct person using two identifiers.  Location: Patient: Home  Provider: Office    I discussed the limitations, risks, security and privacy concerns of performing an evaluation and management service by telephone and the availability of in person appointments. I also discussed with the patient that there may be a patient responsible charge related to this service. The patient expressed understanding and agreed to proceed.   History of Present Illness: 48 year old male former smoker seen for pulmonary consult for shortness of breath wheezing Dec 02, 2017 found to have severe persistent allergic asthma  Today's tele-visit is a 2-week follow-up for asthma Patient has severe refractory asthma, allergic rhinitis with hypereosinophilia phenotype.  He has underwent an aggressive work-up for hypereosinophilia as below.  He has been seen by hematology.  There was no evidence of Churg-Strauss, eosinophilic granulomatosis, ABPA.  He has been treated with aggressive medication regimen with Zyrtec, Flonase, Singulair, Nucala (began in August 2019), Trelegy, chronic steroids with a baseline dose at 5 to 10 mg daily.  He has required multiple steroid tapers due to persistent and refractory wheezing and shortness of breath.  He was referred to [facility] 2019 for second opinion.  He was recommended to change from Nucala to [place] but cost was an issue.  Recently has been approved for Dupixent with somewhat a more affordable option.  Patient was seen last visit after receiving Dupixent with significant improvement in symptoms.  This is the best he has been in 1 year.  Patient says that within 3 days after taking his injection that he started feeling better cough wheezing have decreased.  He still gets short of breath with activity but is able to do more  activities now wheezing and cough have decreased substantially.  His albuterol use has substantially decreased as well.  He does question if we can help him with Dupixent cost as it cost him $300 for each injection.  Last visit patient was recommended to decrease his prednisone from 10 mg daily to 5 mg daily.  Patient says he does feel that his energy level decreased some since doing this.  As above he has been on and off of prednisone over the last year.  Weight has went up substantially since starting prednisone.  Wants to go back to work soon , has not been able to work for last several months. He is trying to get strength up so he can go back to work. Would like to return in 2 weeks.   Observations/Objective: /23/19Exhaled nitric oxide testing today was very high at 152ppb. 11/2017 >Lab work showed very high eosinophil count. With eosinophils at 5800.IgE very high at 850. ANCA neg , Aspergillus panel neg Elevated absolute eosinophil count of 5800 recorded on 12/04/2017. absolute eosinophil count was 1400 on 01/06/2018.  -Elevated IgE level on 2 occasions of 850 and 588. -high-resolution CT scan of the chest which did not reveal any pulmonary involvement.  -FGFR1 and PDGFR beta on peripheral blood was negative.Strongyloides antibody was negative. LDH was in the upper limit of normal. -CT scan of the abdomen on 03/20/2018 shows spleen size in the upper limits of normal. Echocardiogram shows ejection fraction of 55 to 60% with mild thickening of the leaflets. He had normal tryptase levels and B12 was normal. -PDGFR A and FIP1L1wasnegative. Jak 2 mutation was also negative. -GI referralGI for  possible eosinophilic esophagitis. His Nexium was increased to twice a day. They thought doing EGD and biopsy will be low yield given patient is on prednisone. Hematology referral Eosinophilia -workup as above , eosinophils improved so deferred Bone marrow biopsy ENT referral 2019  [facility]  Pulmonary referral 2019 -Dr. Rodman Key Allergy referral 08/2018  Assessment and Plan: Severe persistent asthma significant improvement and response to Dupixent Will refer to our pharmacy team to help with patient assistance options We will continue to try to slowly taper prednisone has he has been on this for several months and will need to come down slowly. We will continue on aggressive regimen.  Along with trigger prevention Check PFTs on return  Plan  Patient Instructions  Continue on Dupixent .  We will get in touch with our pharmacy team to see if we can find some patient assistance to help with cost.  Continue on Prednisone '5mg'$  daily for 3 weeks then every other day -hold at this dose .  Continue on TRELEGY , 1 puff daily , rinse after use  Albuterol inhaler or nebs As needed  Wheezing .    Continue on Prilosec '20mg'$  daily before meal .   Continue on Singulair '10mg'$  daily  Continue on Zyrtec '10mg'$  At bedtime  .  Flonase Nasal daily  Activity as tolerated. Advance as able  Work on [employer] .   May return to work if still doing well in 2 weeks .   Follow up with Dr. Lamonte Sakai or Omega Durante NP in 4-6 weeks and As needed  With PFTs  Will need COVID pre-test .   Please contact office for sooner follow up if symptoms do not improve or worsen or seek emergency care       Follow Up Instructions: Follow-up in 4 to 6 weeks and as needed   I discussed the assessment and treatment plan with the patient. The patient was provided an opportunity to ask questions and all were answered. The patient agreed with the plan and demonstrated an understanding of the instructions.   The patient was advised to call back or seek an in-person evaluation if the symptoms worsen or if the condition fails to improve as anticipated.  I provided 24  minutes of non-face-to-face time during this encounter.   Rexene Edison, NP

## 2019-02-09 NOTE — Telephone Encounter (Signed)
Called and spoke with pt asking him when he was planning on returning back to work as RB was needing this info to place on the paperwork that was received.  Pt stated to me he was thinking about trying to return back to work after his next injection as long as all is still going okay with him.  Pt is currently not scheduled for another injection appt at this time. Routing to both RB and Ria Comment as an FYI with the info I found out from pt.

## 2019-02-09 NOTE — Patient Instructions (Signed)
Continue on Dupixent .  We will get in touch with our pharmacy team to see if we can find some patient assistance to help with cost.  Continue on Prednisone 5mg  daily for 3 weeks then every other day -hold at this dose .  Continue on TRELEGY , 1 puff daily , rinse after use  Albuterol inhaler or nebs As needed  Wheezing .    Continue on Prilosec 20mg  daily before meal .   Continue on Singulair 10mg  daily  Continue on Zyrtec 10mg  At bedtime  .  Flonase Nasal daily  Activity as tolerated. Advance as able  Work on [employer] .   May return to work if still doing well in 2 weeks .   Follow up with Dr. Lamonte Sakai or Conleigh Heinlein NP in 4-6 weeks and As needed  With PFTs  Will need COVID pre-test .   Please contact office for sooner follow up if symptoms do not improve or worsen or seek emergency care

## 2019-02-10 NOTE — Addendum Note (Signed)
Addended by: Parke Poisson E on: 02/10/2019 11:00 AM   Modules accepted: Orders

## 2019-02-12 NOTE — Telephone Encounter (Signed)
Dr. Lamonte Sakai will be in office 02/15/19.   Will also route to injection pool to look on next appt for patient's injection

## 2019-02-15 NOTE — Telephone Encounter (Signed)
Rec'd completed form from [place] - sent to [employer] via [employer]. -pr

## 2019-02-15 NOTE — Telephone Encounter (Signed)
Forms have been completed and will be given back to [place].

## 2019-02-16 ENCOUNTER — Telehealth: Payer: Self-pay | Admitting: Emergency Medicine

## 2019-02-16 NOTE — Telephone Encounter (Signed)
Will route to injection pool and let jess know to look up on medication for patient.

## 2019-02-16 NOTE — Telephone Encounter (Signed)
Yes, patient's Dupixent is here for his injection appt tomorrow Patient is aware  Patient is also inquiring about the return to work date given by [employer] - RB did not place a specific date on the form - says "to be determined."  Called spoke with patient, made him aware of the above.  Patient requested appt with RB in 2 weeks after his next [place] so that return to work may be discussed.  Next Dupixent and appt with RB scheduled for 8.19.2020 @ 1400 and 1430.  Nothing further needed; will sign off.

## 2019-02-17 ENCOUNTER — Ambulatory Visit (INDEPENDENT_AMBULATORY_CARE_PROVIDER_SITE_OTHER): Payer: 59

## 2019-02-17 ENCOUNTER — Other Ambulatory Visit: Payer: Self-pay

## 2019-02-17 DIAGNOSIS — J4550 Severe persistent asthma, uncomplicated: Secondary | ICD-10-CM

## 2019-02-17 MED ORDER — DUPILUMAB 300 MG/2ML ~~LOC~~ SOSY
300.0000 mg | PREFILLED_SYRINGE | Freq: Once | SUBCUTANEOUS | Status: AC
  Administered 2019-02-17: 300 mg via SUBCUTANEOUS

## 2019-02-17 NOTE — Progress Notes (Signed)
Have you been hospitalized within the last 10 days?  No Do you have a fever?  No Do you have a cough?  No Do you have a headache or sore throat? No  

## 2019-02-24 ENCOUNTER — Telehealth: Payer: Self-pay | Admitting: Emergency Medicine

## 2019-02-24 NOTE — Telephone Encounter (Signed)
    Medication name and strength: Dupixent 300mg  every 2 weeks Provider: Dr Lamonte Sakai [facility] Patient insurance ID: [id number] Phone: [phone] Was the PA started on CMM?  no If yes, please enter the Key: n/a Timeframe for approval/denial: should receive fax 48-72 hours  Pt scheduled injection 03/03/19

## 2019-02-26 ENCOUNTER — Telehealth: Payer: Self-pay | Admitting: Emergency Medicine

## 2019-02-26 NOTE — Telephone Encounter (Signed)
Routed to injection pool.  PA started 02/24/19. See other message.

## 2019-02-26 NOTE — Telephone Encounter (Signed)
Dupixent Order: 300mg  #2 prefilled syringe Ordered Date: 02/26/19 Expected date of arrival: 03/02/19 Ordered by: Tonna Corner [facility]

## 2019-02-26 NOTE — Telephone Encounter (Signed)
Called [facility], [phone], spoke with Nevin Bloodgood.  Dupixent PA status checked and approved. Dupixent PA approved from 02/26/19-02/26/20, case # [id number]. Patient notified of approval. Nothing further at this time.

## 2019-03-02 NOTE — Telephone Encounter (Signed)
Dupixent Shipment Received: 300mg  #2 prefilled syringe Medication arrival date: 03/02/19 Lot #: [id number] Exp date: 06/14/2021 Received by: Tonna Corner

## 2019-03-03 ENCOUNTER — Ambulatory Visit: Payer: 59 | Admitting: Emergency Medicine

## 2019-03-03 ENCOUNTER — Encounter: Payer: Self-pay | Admitting: Emergency Medicine

## 2019-03-03 ENCOUNTER — Other Ambulatory Visit: Payer: Self-pay

## 2019-03-03 ENCOUNTER — Ambulatory Visit (INDEPENDENT_AMBULATORY_CARE_PROVIDER_SITE_OTHER): Payer: 59

## 2019-03-03 DIAGNOSIS — K219 Gastro-esophageal reflux disease without esophagitis: Secondary | ICD-10-CM | POA: Diagnosis not present

## 2019-03-03 DIAGNOSIS — J4550 Severe persistent asthma, uncomplicated: Secondary | ICD-10-CM

## 2019-03-03 DIAGNOSIS — J301 Allergic rhinitis due to pollen: Secondary | ICD-10-CM | POA: Diagnosis not present

## 2019-03-03 DIAGNOSIS — J4551 Severe persistent asthma with (acute) exacerbation: Secondary | ICD-10-CM | POA: Diagnosis not present

## 2019-03-03 MED ORDER — DUPILUMAB 300 MG/2ML ~~LOC~~ SOSY
300.0000 mg | PREFILLED_SYRINGE | Freq: Once | SUBCUTANEOUS | Status: AC
  Administered 2019-03-03: 14:00:00 300 mg via SUBCUTANEOUS

## 2019-03-03 NOTE — Patient Instructions (Addendum)
Please continue your Trelegy.  Rinse and gargle after using. Continue Singulair, Flonase, Zyrtec Continue Prilosec Continue your Prednisone at 5mg  daily for the next week. At that time decrease to 2.5 mg once daily.  Stay on this dose until we follow-up I do not believe you should return to work until we establish your lowest effective baseline prednisone dose.  We will discuss the timing of return at your next visit. Follow with Dr. Lamonte Sakai in 1 month

## 2019-03-03 NOTE — Progress Notes (Signed)
Subjective:    Patient ID: Jose Ross, male    DOB: 10/13/1970, 48 y.o.   MRN: [id number]  Asthma He complains of cough, shortness of breath and wheezing. Associated symptoms include headaches and sneezing. Pertinent negatives include no ear pain, fever, postnasal drip, rhinorrhea, sore throat or trouble swallowing. His past medical history is significant for asthma.  Shortness of Breath Associated symptoms include headaches and wheezing. Pertinent negatives include no ear pain, fever, leg swelling, rash, rhinorrhea, sore throat or vomiting. His past medical history is significant for asthma.    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  He also has allergic rhinitis.  We had managed him with Nucala but he had persistent symptoms, was still requiring bursts of prednisone.  He has since been approved for and [place] Dupixent.  He has received one dose >> feels remarkably better. Breathing, energy, wheeze are all better.   His other maintenance regimen includes Trelegy, Singulair, Prilosec.  He is using albuterol approximately once a week now.  His current maintenance prednisone dose is 10mg , has been on this for 2 days.   ROV 03/03/2019 --48 year old male with moderate persistent asthma, hyper eosinophilia, allergic rhinitis.  He has been prednisone dependent but we have finally started to make some headway in control since he was started on Dupixent. He is also managed on Trelegy, Singulair, Prilosec, flonase qd, zyrtec.  Currently on Pred 5mg  daily >> he has noticed some increased asthma sx, wheeze since changing to 5mg .   Review of Systems  Constitutional: Negative for fever and unexpected weight change.  HENT: Positive for congestion, nosebleeds, sinus pressure and sneezing. Negative for dental problem, ear pain, postnasal drip, rhinorrhea, sore throat and  trouble swallowing.   Eyes: Negative for redness and itching.  Respiratory: Positive for cough, chest tightness, shortness of breath and wheezing.   Cardiovascular: Negative for palpitations and leg swelling.  Gastrointestinal: Negative for nausea and vomiting.  Genitourinary: Negative for dysuria.  Musculoskeletal: Negative for joint swelling.  Skin: Negative for rash.  Allergic/Immunologic: Positive for environmental allergies. Negative for food allergies and immunocompromised state.  Neurological: Positive for headaches.  Hematological: Does not bruise/bleed easily.  Psychiatric/Behavioral: Negative for dysphoric mood. The patient is not nervous/anxious.         Objective:   Physical Exam Vitals:   03/03/19 1437  BP: 132/82  Pulse: 68  SpO2: 98%  Weight: 262 lb (118.8 kg)  Height: 5\' 10"  (1.778 m)   Gen: Pleasant, well-nourished, comfortable respiratory pattern today, normal affect, developing some cushingoid facies  ENT: No lesions,  mouth clear,  oropharynx clear but crowded   Neck: No JVD, no stridor with a normal breath, some upper airway noise with a forced expiration   Lungs: No use of accessory muscles, clear to auscultation  Cardiovascular: RRR, heart sounds normal, no murmur or gallops, no peripheral edema  Musculoskeletal: No deformities, no cyanosis or clubbing  Neuro: alert, non focal  Skin: Warm, no lesions or rash     Assessment & Plan:  Asthma Please continue your Trelegy.  Rinse and gargle after using. Continue Singulair Continue your Prednisone at 5mg  daily for the next week. At that time decrease to 2.5 mg once daily.  Stay on this dose until we follow-up I do not believe you should return to work until we establish your lowest effective baseline prednisone dose.  We will discuss the timing of return  at your next visit. Follow with Dr. Lamonte Sakai in 1 month  Allergic rhinitis Flonase, Zyrtec  GERD (gastroesophageal reflux disease) Continue  Prilosec  Baltazar Apo, MD, PhD 03/03/2019, 3:01 PM [facility] [phone] or if no answer [phone]

## 2019-03-03 NOTE — Assessment & Plan Note (Signed)
Please continue your Trelegy.  Rinse and gargle after using. Continue Singulair Continue your Prednisone at 5mg  daily for the next week. At that time decrease to 2.5 mg once daily.  Stay on this dose until we follow-up I do not believe you should return to work until we establish your lowest effective baseline prednisone dose.  We will discuss the timing of return at your next visit. Follow with Dr. Lamonte Sakai in 1 month

## 2019-03-03 NOTE — Assessment & Plan Note (Signed)
Continue Prilosec

## 2019-03-03 NOTE — Progress Notes (Signed)
Have you been hospitalized within the last 10 days?  No Do you have a fever?  No Do you have a cough?  No Do you have a headache or sore throat? No  

## 2019-03-08 ENCOUNTER — Telehealth: Payer: Self-pay | Admitting: Emergency Medicine

## 2019-03-08 ENCOUNTER — Other Ambulatory Visit (HOSPITAL_COMMUNITY): Payer: 59

## 2019-03-08 NOTE — Telephone Encounter (Signed)
Rec'd from [place] forwarded 17 pages to Dr. Baltazar Apo

## 2019-03-15 ENCOUNTER — Ambulatory Visit (HOSPITAL_COMMUNITY): Payer: 59 | Admitting: Hematology

## 2019-03-16 ENCOUNTER — Telehealth: Payer: Self-pay | Admitting: Emergency Medicine

## 2019-03-16 NOTE — Telephone Encounter (Signed)
   I received a call from Dr. Halford Chessman, the physician reviewing the patient's case for their disability insurance carrier.  Reviewed his status, updated his medications including the transition to [place].  We talked about his significant improvement on the new regimen.  Prednisone has been weaned to 5 mg.  He asked my opinion regarding the patient's ability to get back to work.  I have indicated that we have hoped that Jose Ross can return to work as early as mid-September but that I am concerned that dyspnea may preclude the level of exertion that he needs to perform his regular job.  Dr. Halford Chessman agreed, recommended that mid-September is a good goal but that the patient will need to be in a sedentary role initially to ensure that he can tolerate.  Then hopefully we can advance to more moderate exertion, goal to get back to his usual job requirements and role.  I will review with Jose Ross at his follow-up visit in mid-September.   Baltazar Apo, MD, PhD 03/16/2019, 11:06 AM [facility] [phone] or if no answer [phone]

## 2019-03-19 ENCOUNTER — Telehealth: Payer: Self-pay | Admitting: Emergency Medicine

## 2019-03-19 ENCOUNTER — Other Ambulatory Visit: Payer: Self-pay

## 2019-03-19 ENCOUNTER — Inpatient Hospital Stay (HOSPITAL_COMMUNITY): Payer: 59 | Attending: Hematology

## 2019-03-19 DIAGNOSIS — D721 Eosinophilia, unspecified: Secondary | ICD-10-CM

## 2019-03-19 DIAGNOSIS — Z87891 Personal history of nicotine dependence: Secondary | ICD-10-CM | POA: Diagnosis not present

## 2019-03-19 DIAGNOSIS — Z79899 Other long term (current) drug therapy: Secondary | ICD-10-CM | POA: Diagnosis not present

## 2019-03-19 DIAGNOSIS — J4540 Moderate persistent asthma, uncomplicated: Secondary | ICD-10-CM | POA: Diagnosis not present

## 2019-03-19 LAB — COMPREHENSIVE METABOLIC PANEL
ALT: 34 U/L (ref 0–44)
AST: 25 U/L (ref 15–41)
Albumin: 3.8 g/dL (ref 3.5–5.0)
Alkaline Phosphatase: 111 U/L (ref 38–126)
Anion gap: 9 (ref 5–15)
BUN: 13 mg/dL (ref 6–20)
CO2: 25 mmol/L (ref 22–32)
Calcium: 8.8 mg/dL — ABNORMAL LOW (ref 8.9–10.3)
Chloride: 106 mmol/L (ref 98–111)
Creatinine, Ser: 1.17 mg/dL (ref 0.61–1.24)
GFR calc Af Amer: >60 mL/min (ref >60)
GFR calc non Af Amer: >60 mL/min (ref >60)
Glucose, Bld: 105 mg/dL — ABNORMAL HIGH (ref 70–99)
Potassium: 4 mmol/L (ref 3.5–5.1)
Sodium: 140 mmol/L (ref 135–145)
Total Bilirubin: 0.6 mg/dL (ref 0.3–1.2)
Total Protein: 6.6 g/dL (ref 6.5–8.1)

## 2019-03-19 LAB — CBC WITH DIFFERENTIAL/PLATELET
Abs Immature Granulocytes: 0.03 10*3/uL (ref 0.00–0.07)
Basophils Absolute: 0.1 10*3/uL (ref 0.0–0.1)
Basophils Relative: 1 %
Eosinophils Absolute: 0.6 10*3/uL — ABNORMAL HIGH (ref 0.0–0.5)
Eosinophils Relative: 6 %
HCT: 51.5 % (ref 39.0–52.0)
Hemoglobin: 16.9 g/dL (ref 13.0–17.0)
Immature Granulocytes: 0 %
Lymphocytes Relative: 29 %
Lymphs Abs: 2.8 10*3/uL (ref 0.7–4.0)
MCH: 31.8 pg (ref 26.0–34.0)
MCHC: 32.8 g/dL (ref 30.0–36.0)
MCV: 96.8 fL (ref 80.0–100.0)
Monocytes Absolute: 0.7 10*3/uL (ref 0.1–1.0)
Monocytes Relative: 8 %
Neutro Abs: 5.5 10*3/uL (ref 1.7–7.7)
Neutrophils Relative %: 56 %
Platelets: 189 10*3/uL (ref 150–400)
RBC: 5.32 MIL/uL (ref 4.22–5.81)
RDW: 12.7 % (ref 11.5–15.5)
WBC: 9.8 10*3/uL (ref 4.0–10.5)
nRBC: 0 % (ref 0.0–0.2)

## 2019-03-19 NOTE — Telephone Encounter (Signed)
Called and spoke with Patient.  Patient scheduled 03/23/19, at 2:30 for Dupixent injection.  Nothing further at this time.

## 2019-03-22 LAB — IGE: IgE (Immunoglobulin E), Serum: 199 IU/mL (ref 6–495)

## 2019-03-23 ENCOUNTER — Other Ambulatory Visit: Payer: Self-pay

## 2019-03-23 ENCOUNTER — Ambulatory Visit (INDEPENDENT_AMBULATORY_CARE_PROVIDER_SITE_OTHER): Payer: 59

## 2019-03-23 DIAGNOSIS — J4551 Severe persistent asthma with (acute) exacerbation: Secondary | ICD-10-CM

## 2019-03-23 MED ORDER — DUPILUMAB 300 MG/2ML ~~LOC~~ SOSY
300.0000 mg | PREFILLED_SYRINGE | Freq: Once | SUBCUTANEOUS | Status: AC
  Administered 2019-03-23: 300 mg via SUBCUTANEOUS

## 2019-03-23 NOTE — Progress Notes (Signed)
All questions were answered by the patient before medication was administered. Have you been hospitalized in the last 10 days? No Do you have a fever? No Do you have a cough? No Do you have a headache or sore throat? No  

## 2019-03-24 ENCOUNTER — Telehealth (HOSPITAL_COMMUNITY): Payer: Self-pay | Admitting: *Deleted

## 2019-03-24 NOTE — Telephone Encounter (Signed)
Received a  Voicemail from [place] from [place] wanting the doctor's note from the office visit of 03/15/19. Pt cancelled that appointment and rescheduled appointment for 03/26/19. Called [place] from [place] and told her about the change in appointment time and date. I also told her I would fax the note for the office visit from 03/26/19. Kaitlyn verbalized understanding and stated she would let his case manger know.

## 2019-03-26 ENCOUNTER — Inpatient Hospital Stay (HOSPITAL_BASED_OUTPATIENT_CLINIC_OR_DEPARTMENT_OTHER): Payer: 59 | Admitting: Hematology

## 2019-03-26 ENCOUNTER — Other Ambulatory Visit: Payer: Self-pay

## 2019-03-26 DIAGNOSIS — D721 Eosinophilia, unspecified: Secondary | ICD-10-CM

## 2019-03-26 NOTE — Assessment & Plan Note (Signed)
1.  Eosinophilia: - Elevated absolute eosinophil count of 5800 recorded on 12/04/2017.  Most recent dated absolute eosinophil count was 1400 on 01/06/2018.   -Elevated IgE level on 2 occasions of 850 and 588. - I have reviewed his high-resolution CT scan of the chest which did not reveal any pulmonary involvement.  He does not have any gastrointestinal symptoms. - FGFR1 and PDGFR beta on peripheral blood was negative.  Strongyloides antibody was negative.  LDH was in the upper limit of normal. - CT scan of the abdomen on 03/20/2018 shows spleen size in the upper limits of normal.  Echocardiogram shows ejection fraction of 55 to 60% with mild thickening of the leaflets.  He had normal tryptase levels and B12 was normal. - PDGFR A and FIP1L1 was negative.  Jak 2 mutation was also negative. - As there was no convincing evidence of primary eosinophilic disorders, I did not recommend bone marrow aspiration and biopsy. - He is currently on Dupixent every 2 weeks and prednisone 10 mg daily.  He states pulmonology recently tried to taper him down off of prednisone however his asthmatic symptoms returned. -Labs today only reveal a slightly elevated eosinophil count at 0.6, IgE level is within normal at 199. -We will continue to monitor eosinophil levels.  He will return to clinic in 6 months.   2.  Moderate persistent asthma: -Nucala injections were discontinued and now he is on Dupixent.  He is also using prednisone along with inhalers.

## 2019-03-26 NOTE — Progress Notes (Signed)
[address] [place], [place] [address]   CLINIC:  Medical Oncology/Hematology  PCP:  Patient, No Pcp Per No address on file None   REASON FOR VISIT:  Follow-up for Eosinophilla   CURRENT THERAPY: Dupixent/prednisone    INTERVAL HISTORY:  Jose Ross 48 y.o. male presents today for follow-up.  He reports overall doing well.  He states his pulmonologist recently started him on Dupixent every 2 weeks.  He states that this drug is greatly helped him.  He remains on prednisone 10 mg daily.  He states his pulmonologist did try to taper him off prednisone but his pulmonary symptoms return.  He denies any other eosinophilia related syndromes.  He presents today for repeat labs and follow-up.  REVIEW OF SYSTEMS:  Review of Systems  Constitutional: Positive for fatigue.  HENT:  Negative.   Eyes: Negative.   Respiratory: Positive for chest tightness, cough, shortness of breath and wheezing.   Cardiovascular: Negative.   Gastrointestinal: Negative.   Endocrine: Negative.   Genitourinary: Negative.    Musculoskeletal: Negative.   Skin: Negative.   Neurological: Negative.   Hematological: Negative.   Psychiatric/Behavioral: Negative.      PAST MEDICAL/SURGICAL HISTORY:  Past Medical History:  Diagnosis Date  . Asthma   . Eosinophilia   . Pulmonary nodules    No past surgical history on file.   SOCIAL HISTORY:  Social History   Socioeconomic History  . Marital status: Single    Spouse name: Not on file  . Number of children: 2  . Years of education: Not on file  . Highest education level: Not on file  Occupational History  . Not on file  Social Needs  . Financial resource strain: Not hard at all  . Food insecurity    Worry: Never true    Inability: Never true  . Transportation needs    Medical: No    Non-medical: No  Tobacco Use  . Smoking status: Former Smoker    Packs/day: 0.25    Years: 10.00    Pack years: 2.50    Types:  Cigarettes    Quit date: 2000    Years since quitting: 20.7  . Smokeless tobacco: Never Used  Substance and Sexual Activity  . Alcohol use: Yes    Comment: 4-5 drinks a week  . Drug use: Not Currently  . Sexual activity: Not on file  Lifestyle  . Physical activity    Days per week: 3 days    Minutes per session: 30 min  . Stress: Only a little  Relationships  . Social connections    Talks on phone: More than three times a week    Gets together: Three times a week    Attends religious service: Never    Active member of club or organization: No    Attends meetings of clubs or organizations: Never    Relationship status: Divorced  . Intimate partner violence    Fear of current or ex partner: No    Emotionally abused: No    Physically abused: No    Forced sexual activity: No  Other Topics Concern  . Not on file  Social History Narrative  . Not on file    FAMILY HISTORY:  Family History  Problem Relation Age of Onset  . Asthma Mother   . Skin cancer Mother     CURRENT MEDICATIONS:  Outpatient Encounter Medications as of 03/26/2019  Medication Sig  . cetirizine (ZYRTEC) 10 MG  tablet Take 10 mg by mouth daily.  . Dupilumab (DUPIXENT) 300 MG/2ML SOPN Inject 300 mg into the skin every 14 (fourteen) days.  . fluticasone (FLONASE) 50 MCG/ACT nasal spray Place 2 sprays into both nostrils daily.  . Fluticasone-Umeclidin-Vilant 100-62.5-25 MCG/INH AEPB Inhale 1 puff into the lungs daily.   . montelukast (SINGULAIR) 10 MG tablet Take 1 tablet (10 mg total) by mouth at bedtime.  . predniSONE (DELTASONE) 5 MG tablet Alternate 70m and 571mdaily  . albuterol (PROVENTIL HFA;VENTOLIN HFA) 108 (90 Base) MCG/ACT inhaler Inhale 2 puffs into the lungs every 4 (four) hours as needed for wheezing or shortness of breath (cough, shortness of breath or wheezing.). (Patient not taking: Reported on 03/26/2019)  . albuterol (PROVENTIL) (2.5 MG/3ML) 0.083% nebulizer solution Take 3 mLs (2.5 mg total)  by nebulization every 4 (four) hours as needed for wheezing or shortness of breath. (Patient not taking: Reported on 03/26/2019)  . EPINEPHrine 0.3 mg/0.3 mL IJ SOAJ injection Inject one dose intramuscularly for allergic reaction. May repeat one dose if needed after 5-15 minutes. Proceed to the ER  . omeprazole (PRILOSEC) 20 MG capsule Take 1 capsule (20 mg total) by mouth 2 (two) times daily before a meal. (Patient not taking: Reported on 03/26/2019)   No facility-administered encounter medications on file as of 03/26/2019.     ALLERGIES:  No Known Allergies   PHYSICAL EXAM:  ECOG Performance status: 1  Vitals:   03/26/19 1021  BP: (!) 138/95  Pulse: (!) 110  Resp: 20  Temp: (!) 97 F (36.1 C)  SpO2: 99%   Filed Weights   03/26/19 1021  Weight: 272 lb (123.4 kg)    Physical Exam Constitutional:      Appearance: Normal appearance. He is obese.  HENT:     Head: Normocephalic.     Right Ear: External ear normal.     Left Ear: External ear normal.     Nose: Nose normal.     Mouth/Throat:     Mouth: Mucous membranes are dry.     Pharynx: Oropharynx is clear.  Eyes:     Extraocular Movements: Extraocular movements intact.     Conjunctiva/sclera: Conjunctivae normal.  Neck:     Musculoskeletal: Normal range of motion.  Cardiovascular:     Rate and Rhythm: Regular rhythm. Tachycardia present.     Pulses: Normal pulses.     Heart sounds: Normal heart sounds.  Pulmonary:     Effort: Pulmonary effort is normal.     Breath sounds: Normal breath sounds.  Abdominal:     General: Bowel sounds are normal.     Palpations: Abdomen is soft.  Musculoskeletal: Normal range of motion.  Skin:    General: Skin is warm.  Neurological:     General: No focal deficit present.     Mental Status: He is alert. Mental status is at baseline.  Psychiatric:        Mood and Affect: Mood normal.        Behavior: Behavior normal.        Thought Content: Thought content normal.         Judgment: Judgment normal.      LABORATORY DATA:  I have reviewed the labs as listed.  CBC    Component Value Date/Time   WBC 9.8 03/19/2019 1057   RBC 5.32 03/19/2019 1057   HGB 16.9 03/19/2019 1057   HCT 51.5 03/19/2019 1057   PLT 189 03/19/2019 1057   MCV 96.8 03/19/2019  1057   MCH 31.8 03/19/2019 1057   MCHC 32.8 03/19/2019 1057   RDW 12.7 03/19/2019 1057   LYMPHSABS 2.8 03/19/2019 1057   MONOABS 0.7 03/19/2019 1057   EOSABS 0.6 (H) 03/19/2019 1057   BASOSABS 0.1 03/19/2019 1057   CMP Latest Ref Rng & Units 03/19/2019 11/11/2018 06/24/2018  Glucose 70 - 99 mg/dL 105(H) 102(H) 100(H)  BUN 6 - 20 mg/dL _0 Creatinine 0.61 - 1.24 mg/dL 1.17 1.17 1.28(H)  Sodium 135 - 145 mmol/L 140 140 140  Potassium 3.5 - 5.1 mmol/L 4.0 3.9 3.6  Chloride 98 - 111 mmol/L 106 103 104  CO2 22 - 32 mmol/L _1 Calcium 8.9 - 10.3 mg/dL 8.8(L) 8.9 9.0  Total Protein 6.5 - 8.1 g/dL 6.6 7.0 7.0  Total Bilirubin 0.3 - 1.2 mg/dL 0.6 0.5 0.7  Alkaline Phos 38 - 126 U/L 111 106 84  AST 15 - 41 U/L _2 ALT 0 - 44 U/L 34 31 29        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  Most recent dated absolute eosinophil count was 1400 on 01/06/2018.   -Elevated IgE level on 2 occasions of 850 and 588. - I have reviewed his high-resolution CT scan of the chest which did not reveal any pulmonary involvement.  He does not have any gastrointestinal symptoms. - FGFR1 and PDGFR beta on peripheral blood was negative.  Strongyloides antibody was negative.  LDH was in the upper limit of normal. - CT scan of the abdomen on 03/20/2018 shows spleen size in the upper limits of normal.  Echocardiogram shows ejection fraction of 55 to 60% with mild thickening of the leaflets.  He had normal tryptase levels and B12 was normal. - PDGFR A and FIP1L1 was negative.  Jak 2 mutation was also negative. - As there was no convincing  evidence of primary eosinophilic disorders, I did not recommend bone marrow aspiration and biopsy. - He is currently on Dupixent every 2 weeks and prednisone 10 mg daily.  He states pulmonology recently tried to taper him down off of prednisone however his asthmatic symptoms returned. -Labs today only reveal a slightly elevated eosinophil count at 0.6, IgE level is within normal at 199. -We will continue to monitor eosinophil levels.  He will return to clinic in 6 months.   2.  Moderate persistent asthma: -Nucala injections were discontinued and now he is on Dupixent.  He is also using prednisone along with inhalers.         Orders placed this encounter:  Orders Placed This Encounter  Procedures  . CBC with Differential  . Comprehensive metabolic panel  . [place], [place] [phone]

## 2019-03-29 ENCOUNTER — Telehealth: Payer: Self-pay | Admitting: Emergency Medicine

## 2019-03-29 NOTE — Telephone Encounter (Signed)
Dupixent Order: 300mg  #2 prefilled syringe Ordered Date: 03/29/2019 Expected date of arrival: 03/31/2019 Ordered by: Desmond Dike, [place]  Specialty Pharmacy: [facility]

## 2019-03-31 NOTE — Telephone Encounter (Signed)
Dupixent Shipment Received: 300mg  #2 prefilled syringe Medication arrival date: 03/31/19 Lot #: [id number] Exp date: 08/15/2021 Received by: Elliot Dally

## 2019-04-06 ENCOUNTER — Other Ambulatory Visit: Payer: Self-pay

## 2019-04-06 ENCOUNTER — Ambulatory Visit: Payer: 59 | Admitting: Emergency Medicine

## 2019-04-06 ENCOUNTER — Ambulatory Visit: Payer: 59

## 2019-04-06 ENCOUNTER — Encounter: Payer: Self-pay | Admitting: Emergency Medicine

## 2019-04-06 VITALS — BP 122/80 | HR 97 | Ht 70.0 in | Wt 270.0 lb

## 2019-04-06 DIAGNOSIS — E273 Drug-induced adrenocortical insufficiency: Secondary | ICD-10-CM | POA: Insufficient documentation

## 2019-04-06 DIAGNOSIS — J4551 Severe persistent asthma with (acute) exacerbation: Secondary | ICD-10-CM | POA: Diagnosis not present

## 2019-04-06 DIAGNOSIS — T380X5A Adverse effect of glucocorticoids and synthetic analogues, initial encounter: Secondary | ICD-10-CM

## 2019-04-06 MED ORDER — HYDROCORTISONE 10 MG PO TABS: ORAL_TABLET | ORAL | 5 refills | Status: DC

## 2019-04-06 MED ORDER — DUPILUMAB 300 MG/2ML ~~LOC~~ SOSY
300.0000 mg | PREFILLED_SYRINGE | Freq: Once | SUBCUTANEOUS | Status: AC
  Administered 2019-04-06: 300 mg via SUBCUTANEOUS

## 2019-04-06 NOTE — Progress Notes (Signed)
Subjective:    Patient ID: Jose Ross, male    DOB: Mar 25, 1971, 48 y.o.   MRN: [id number]  Asthma He complains of cough, shortness of breath and wheezing. Associated symptoms include headaches and sneezing. Pertinent negatives include no ear pain, fever, postnasal drip, rhinorrhea, sore throat or trouble swallowing. His past medical history is significant for asthma.  Shortness of Breath Associated symptoms include headaches and wheezing. Pertinent negatives include no ear pain, fever, leg swelling, rash, rhinorrhea, sore throat or vomiting. His past medical history is significant for asthma.    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  He also has allergic rhinitis.  We had managed him with Nucala but he had persistent symptoms, was still requiring bursts of prednisone.  He has since been approved for and is changed from [place] to Dupixent.  He has received one dose >> feels remarkably better. Breathing, energy, wheeze are all better.   His other maintenance regimen includes Trelegy, Singulair, Prilosec.  He is using albuterol approximately once a week now.  His current maintenance prednisone dose is 10mg , has been on this for 2 days.   ROV 03/03/2019 --48 year old male with moderate persistent asthma, hyper eosinophilia, allergic rhinitis.  He has been prednisone dependent but we have finally started to make some headway in control since he was started on Dupixent. He is also managed on Trelegy, Singulair, Prilosec, flonase qd, zyrtec.  Currently on Pred 5mg  daily >> he has noticed some increased asthma sx, wheeze since changing to 5mg .  ROV 04/06/2019 --48 year old man with moderate persistent asthma and hypereosinophilia, allergic rhinitis.  Is been prednisone dependent for months and we have been able to slowly begin to taper this since he started on Dupixent.  His maintenance  routine is Trelegy, Singulair, Prilosec, Flonase, Zyrtec.  At his last visit I tried decreasing his prednisone to 2.5 mg daily.  He reports that he has been breathing well, but that he has developed low energy, fatigue, joint pain, moodiness that began about 1 week after the change. He changed back to 5mg  daily about 3 days ago - hasn't noticed any real change in the symptoms above.    Review of Systems  Constitutional: Negative for fever and unexpected weight change.  HENT: Positive for congestion, nosebleeds, sinus pressure and sneezing. Negative for dental problem, ear pain, postnasal drip, rhinorrhea, sore throat and trouble swallowing.   Eyes: Negative for redness and itching.  Respiratory: Positive for cough, chest tightness, shortness of breath and wheezing.   Cardiovascular: Negative for palpitations and leg swelling.  Gastrointestinal: Negative for nausea and vomiting.  Genitourinary: Negative for dysuria.  Musculoskeletal: Negative for joint swelling.  Skin: Negative for rash.  Allergic/Immunologic: Positive for environmental allergies. Negative for food allergies and immunocompromised state.  Neurological: Positive for headaches.  Hematological: Does not bruise/bleed easily.  Psychiatric/Behavioral: Negative for dysphoric mood. The patient is not nervous/anxious.         Objective:   Physical Exam Vitals:   04/06/19 1329  BP: 122/80  Pulse: 97  SpO2: 97%  Weight: 270 lb (122.5 kg)  Height: 5\' 10"  (1.778 m)   Gen: Pleasant, well-nourished, comfortable respiratory pattern today, normal affect, developing some cushingoid facies  ENT: No lesions,  mouth clear,  oropharynx clear but crowded   Neck: No JVD, no stridor with a normal breath, no wheeze with normal breath or on a forced exp   Lungs: No use of accessory  muscles, clear to auscultation  Cardiovascular: RRR, heart sounds normal, no murmur or gallops, no peripheral edema  Musculoskeletal: No deformities, no  cyanosis or clubbing  Neuro: alert, non focal  Skin: Warm, no lesions or rash     Assessment & Plan:  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  He is having symptoms that are suggestive of adrenal insufficiency.  I am going to try to transition him to hydrocortisone 20 mg in the morning, 10 mg in the afternoon.  We will see if it is possible to slowly wean from there.  He will likely need an endocrinology referral but wants to try this, avoid any new appointments for now.  I will see him back in 2 to 3 weeks to assess  Asthma Tolerated prednisone 2.5 mg at least from a breathing standpoint.  The Dupixent has been very effective.  We will continue his current bronchodilator regimen, allergy regimen.  Continue the Dupixent as per his current schedule.  I do not think he can go back to work yet due to the adrenal insufficiency  Baltazar Apo, MD, PhD 04/06/2019, 2:01 PM [facility] [phone] or if no answer [phone]

## 2019-04-06 NOTE — Assessment & Plan Note (Addendum)
Tolerated prednisone 2.5 mg at least from a breathing standpoint.  The Dupixent has been very effective.  We will continue his current bronchodilator regimen, allergy regimen.  Continue the Dupixent as per his current schedule.  I do not think he can go back to work yet due to the adrenal insufficiency

## 2019-04-06 NOTE — Assessment & Plan Note (Signed)
His breathing is tolerating decrease in prednisone, got as low as 2.5 mg daily.  He is having symptoms that are suggestive of adrenal insufficiency.  I am going to try to transition him to hydrocortisone 20 mg in the morning, 10 mg in the afternoon.  We will see if it is possible to slowly wean from there.  He will likely need an endocrinology referral but wants to try this, avoid any new appointments for now.  I will see him back in 2 to 3 weeks to assess

## 2019-04-06 NOTE — Patient Instructions (Signed)
We will start hydrocortisone 20mg  in the am, 10mg  in the pm Stop prednisone once you get on the hydrocortisone.  Continue your other medications as you have been taking them  Continue Dupixent as you have been receiving it Follow with Dr Lamonte Sakai in 2 -3 weeks

## 2019-04-07 IMAGING — CT CT ABDOMEN W/ CM
2 of 5 series · 16 of 46 positions shown, 18 images · IV contrast (iopamidol)
Comparison: None.

CLINICAL DATA: Eosinophilia.

EXAM:
CT ABDOMEN WITH CONTRAST
TECHNIQUE: Multidetector CT imaging of the abdomen was performed using the
standard protocol following bolus administration of intravenous
contrast.
CONTRAST:  100mL 4A9IKF-FTT IOPAMIDOL (4A9IKF-FTT) INJECTION 61%

[Series 2: axial st · axial · 0.78mm/px · z∈[+1056,+1316]mm · 13 of 60 slices shown, 15 images]
[im 4/60  soft-tissue]
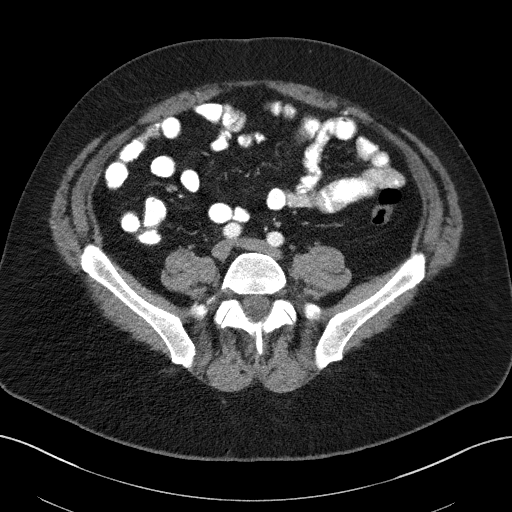
[im 4/60  bone]
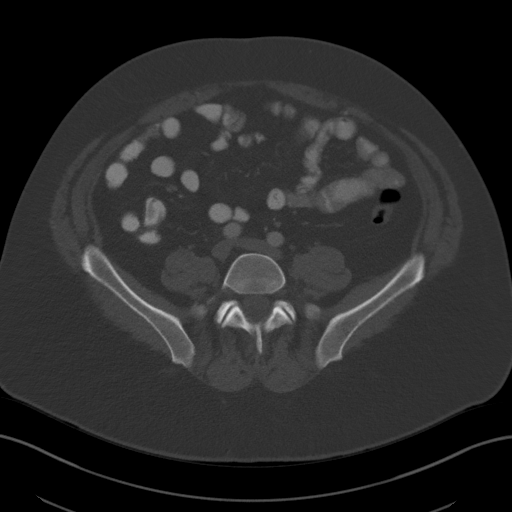
[im 8/60  soft-tissue]
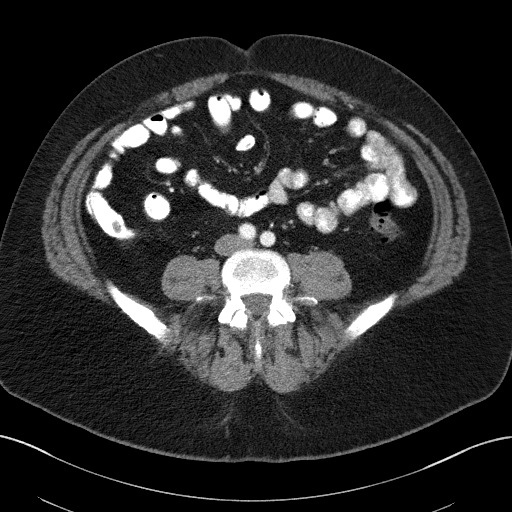
[im 12/60  soft-tissue]
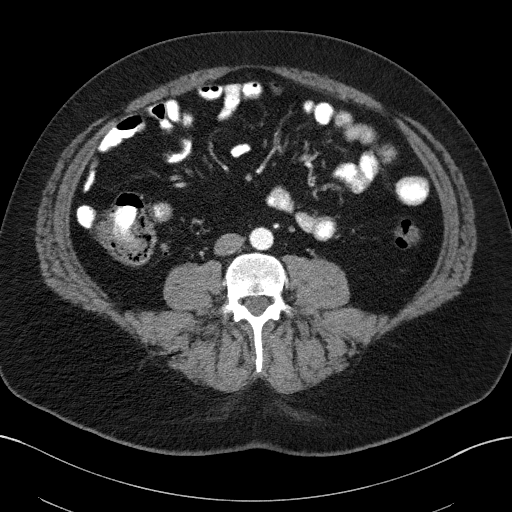
[im 16/60  soft-tissue]
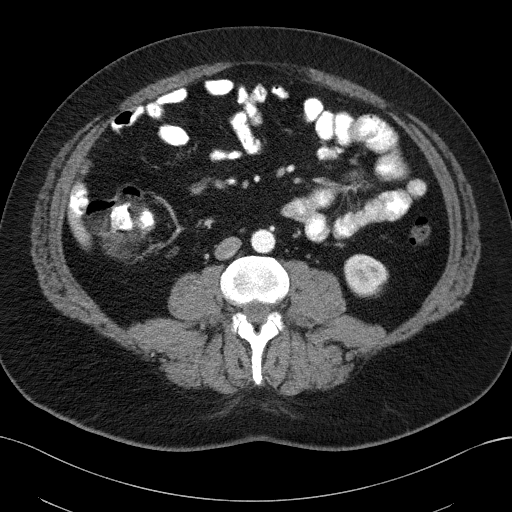
[im 20/60  soft-tissue]
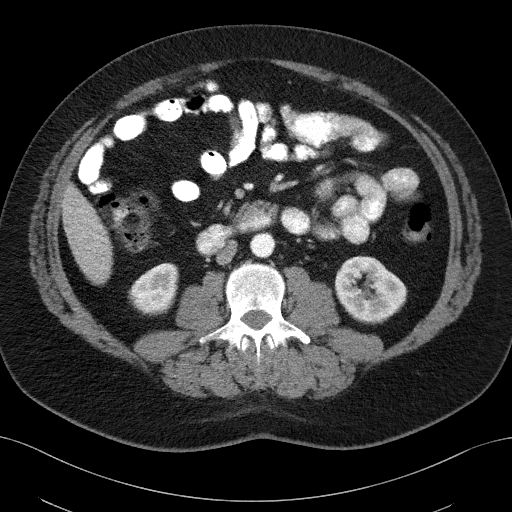
[im 24/60  soft-tissue]
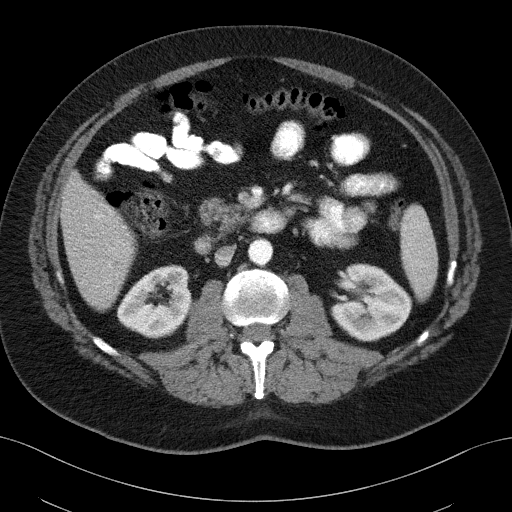
[im 32/60  soft-tissue]
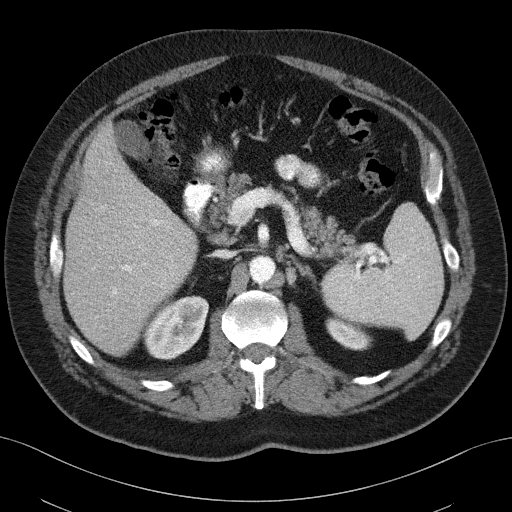
[im 36/60  soft-tissue]
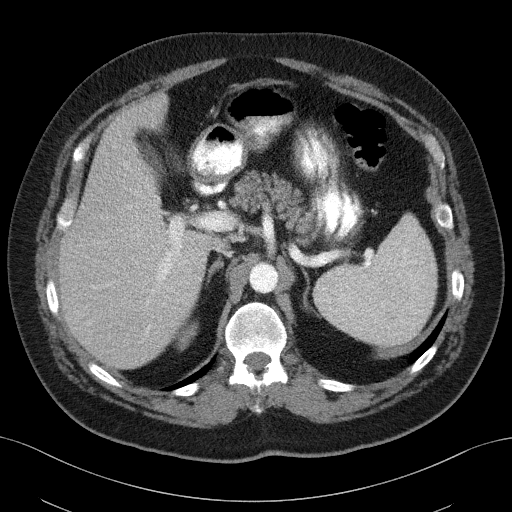
[im 40/60  soft-tissue]
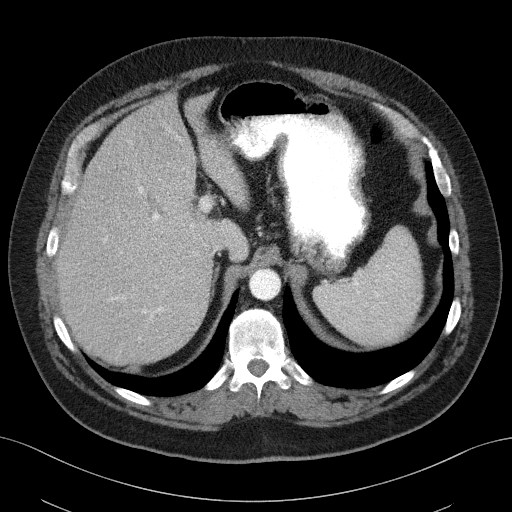
[im 40/60  bone]
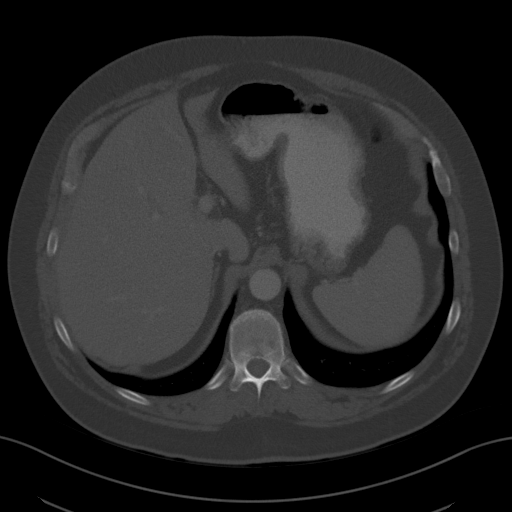
[im 44/60  soft-tissue]
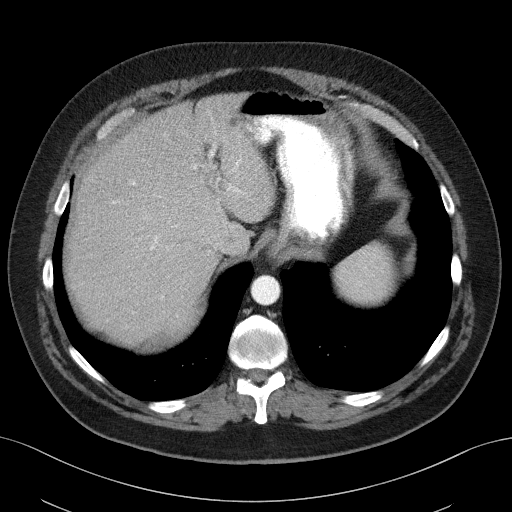
[im 48/60  soft-tissue]
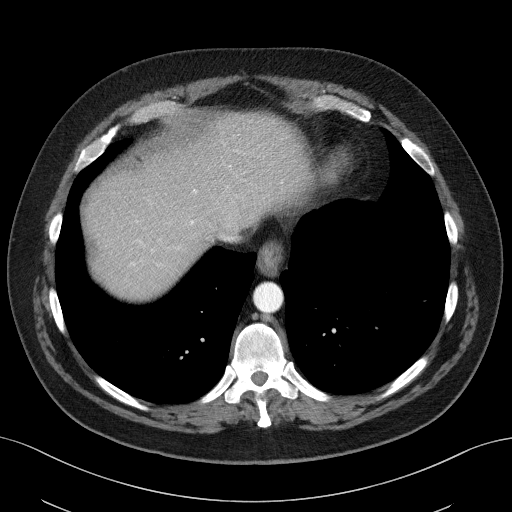
[im 52/60  soft-tissue]
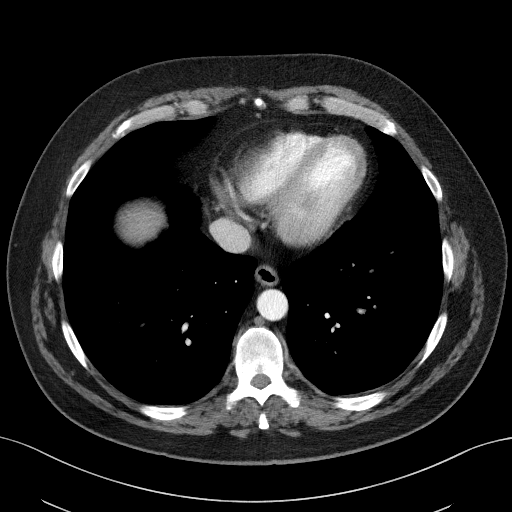
[im 56/60  soft-tissue]
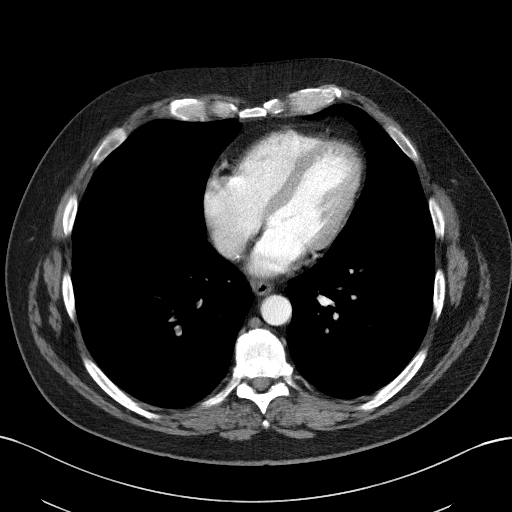

[Series 5: coronal st · coronal · 0.77mm/px · 3 of 101 slices shown]
[im 34/101  soft-tissue]
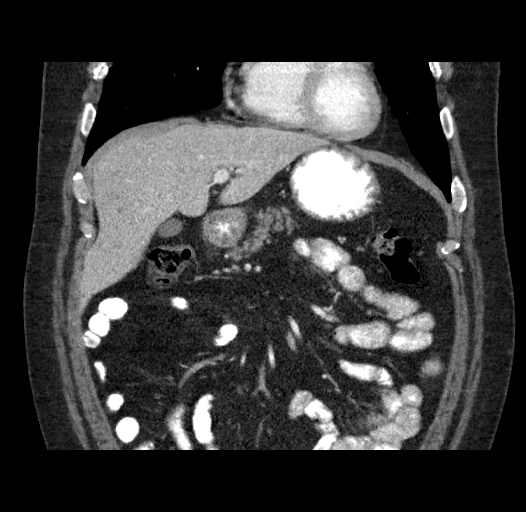
[im 45/101  soft-tissue]
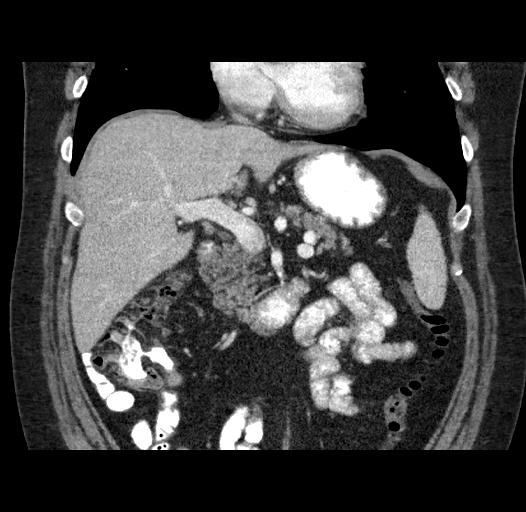
[im 56/101  soft-tissue]
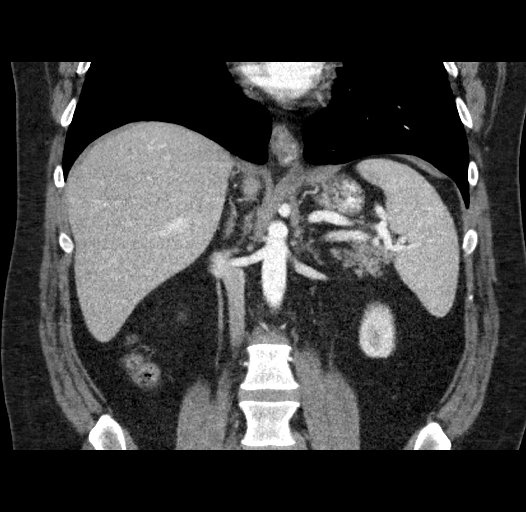

[16 of 46 positions shown; findings below may reference images not displayed]

FINDINGS: Lower chest: 6 mm left lower lobe lung nodule is partially obscured
by motion artifact, image 78/5. Previously 4 mm.

Hepatobiliary: 3 small low-density foci within both lobes of liver
measure up to 6 mm. These are too small to reliably characterize.
Gallbladder appears normal. No biliary dilatation.

Pancreas: Unremarkable. No pancreatic ductal dilatation or
surrounding inflammatory changes.

Spleen: The spleen measures 10.5 by 6.5 by 12.6 cm (volume = 450
cm^3).

Adrenals/Urinary Tract: Normal adrenal glands.

Normal appearance of the right kidney. Left kidney cyst measures
cm. No mass or hydronephrosis identified bilaterally.

Stomach/Bowel: Small hiatal hernia. The visualized bowel loops
within the abdomen are unremarkable. The appendix is visualized and
appears normal.

Vascular/Lymphatic: Normal appearance of the abdominal aorta. No
enlarged lymph nodes.

Other: No free fluid or fluid collections.

Musculoskeletal: No acute or significant osseous findings.
IMPRESSION: 1. No acute findings identified within the abdomen.
2. The spleen is upper limits of normal in size measuring 12.6 cm in
length with a volume of 450 cc.
3. Left lower lobe pulmonary nodule, partially obscured by motion
artifact measures 6 mm. Non-contrast chest CT at 3-6 months is
recommended. If the nodules are stable at time of repeat CT, then
future CT at 18-24 months (from today's scan) is considered optional
for low-risk patients, but is recommended for high-risk patients.
This recommendation follows the consensus statement: Guidelines for
Management of Incidental Pulmonary Nodules Detected on CT Images:

## 2019-04-09 ENCOUNTER — Telehealth: Payer: Self-pay | Admitting: Emergency Medicine

## 2019-04-09 NOTE — Telephone Encounter (Signed)
[facility] , unable to reach anyone. Not really sure which option to chose. I chose the one for other. And left a message for them to call our office back.

## 2019-04-13 NOTE — Telephone Encounter (Signed)
[facility].  LM to call back.

## 2019-04-14 NOTE — Telephone Encounter (Signed)
OV note from 9/22 has been printed and faxed to [facility] attention. Work status was mentioned in the office note.

## 2019-04-14 NOTE — Telephone Encounter (Signed)
Kaitlyn from [employer], calling to see if OV notes and work status has been faxed.  [phone] ext [phone]

## 2019-04-16 ENCOUNTER — Telehealth: Payer: Self-pay | Admitting: Emergency Medicine

## 2019-04-20 ENCOUNTER — Ambulatory Visit: Payer: 59 | Admitting: Adult Health

## 2019-04-20 ENCOUNTER — Other Ambulatory Visit: Payer: Self-pay

## 2019-04-20 ENCOUNTER — Encounter: Payer: Self-pay | Admitting: Adult Health

## 2019-04-20 ENCOUNTER — Ambulatory Visit: Payer: 59 | Admitting: Emergency Medicine

## 2019-04-20 VITALS — BP 136/74 | HR 95 | Ht 70.0 in | Wt 270.8 lb

## 2019-04-20 DIAGNOSIS — R400 Somnolence: Secondary | ICD-10-CM | POA: Diagnosis not present

## 2019-04-20 DIAGNOSIS — G4733 Obstructive sleep apnea (adult) (pediatric): Secondary | ICD-10-CM | POA: Insufficient documentation

## 2019-04-20 DIAGNOSIS — J4550 Severe persistent asthma, uncomplicated: Secondary | ICD-10-CM

## 2019-04-20 DIAGNOSIS — E273 Drug-induced adrenocortical insufficiency: Secondary | ICD-10-CM

## 2019-04-20 DIAGNOSIS — J301 Allergic rhinitis due to pollen: Secondary | ICD-10-CM

## 2019-04-20 DIAGNOSIS — T380X5A Adverse effect of glucocorticoids and synthetic analogues, initial encounter: Secondary | ICD-10-CM

## 2019-04-20 DIAGNOSIS — R0683 Snoring: Secondary | ICD-10-CM

## 2019-04-20 MED ORDER — DUPILUMAB 300 MG/2ML ~~LOC~~ SOSY
300.0000 mg | PREFILLED_SYRINGE | Freq: Once | SUBCUTANEOUS | Status: AC
  Administered 2019-04-20: 300 mg via SUBCUTANEOUS

## 2019-04-20 NOTE — Patient Instructions (Addendum)
Continue on Dupixent .  Refer to Endocrinology .  Decrease Hydrocortisone 10mg  Twice daily  .  Continue on TRELEGY , 1 puff daily , rinse after use  Albuterol inhaler or nebs As needed  Wheezing .  Continue on Singulair 10mg  daily  Continue on Zyrtec 10mg  At bedtime  .  Flonase Nasal daily  Activity as tolerated. Advance as able  Work on [employer] . Work on healthy weight loss.  Refer to Primary Care .  Follow up with Dr. Lamonte Sakai or Parrett NP in 4- weeks and As needed   Please contact office for sooner follow up if symptoms do not improve or worsen or seek emergency care

## 2019-04-20 NOTE — Assessment & Plan Note (Signed)
Continue on preventative regimen 

## 2019-04-20 NOTE — Assessment & Plan Note (Signed)
Suspected underlying obstructive sleep apnea with multiple risk factors including morbid obesity,  class III-IV MP airway, daytime sleepiness, snoring  Check home sleep study

## 2019-04-20 NOTE — Progress Notes (Signed)
_0  ID: Jose Ross, male    DOB: [birth year], 48 y.o.   MRN: [id number]  Chief Complaint  Patient presents with  . Follow-up    Asthma     Referring provider: No ref. provider found  HPI: 48 year old male former smoker seen for pulmonary consult Dec 02, 2017 for severe persistent allergic asthma  TEST/EVENTS :  23/19Exhaled nitric oxide testing today was very high at 152ppb. 11/2017 >Lab work showed very high eosinophil count. With eosinophils at 5800.IgE very high at 850. ANCA neg , Aspergillus panel neg Elevated absolute eosinophil count of 5800 recorded on 12/04/2017. absolute eosinophil count was 1400 on 01/06/2018.  -Elevated IgE level on 2 occasions of 850 and 588. -high-resolution CT scan of the chest which did not reveal any pulmonary involvement.  -FGFR1 and PDGFR beta on peripheral blood was negative.Strongyloides antibody was negative. LDH was in the upper limit of normal. -CT scan of the abdomen on 03/20/2018 shows spleen size in the upper limits of normal. Echocardiogram shows ejection fraction of 55 to 60% with mild thickening of the leaflets. He had normal tryptase levels and B12 was normal. -PDGFR A and FIP1L1wasnegative. Jak 2 mutation was also negative. -GI referralGI for possible eosinophilic esophagitis. His Nexium was increased to twice a day. They thought doing EGD and biopsy will be low yield given patient is on prednisone. Hematology referral Eosinophilia -workup as above , eosinophils improved so deferred Bone marrow biopsy ENT referral 2019  [facility] referral 2019 -Dr. Zigmund Daniel  [facility] referral 08/2018    04/20/2019 Follow up : Severe persistent asthma Patient has a known history of severe refractory asthma, allergic rhinitis with hypereosinophilia phenotype.  Patient has had significant improvement in symptoms since beginning [place]. He remains on Trelegy, Zyrtec, Flonase, Singulair.  During his aggressive  asthma work-up he underwent a work-up for hypereosinophilia.  He was seen by hematology.  There was no evidence of Churg-Strauss, eosinophilic granulomatosis, ABPA.  Previously tried on Nucala in August 2019 without significant improvement in symptoms.  He has required multiple steroid burst and required chronic steroids with a baseline from 5 to 10 mg from 2019-2020.  He did go to [facility] for a second opinion in 2019.  He was recommended to switch from Nucala to [place] at that time but was unable to afford.  He was approved for Dupixent in summer 2020.  Since beginning [place] had significant change in symptoms with decreased cough wheezing and increased activity tolerance.  Last visit was having difficulty weaning off totally off prednisone.  Was felt to have possible underlying adrenal insufficiency symptoms. Had joint aches, fatigue , low energy level.   He was changed from prednisone to hydrocortisone 20 mg in the morning 10 mg in the evening.  Patient had declined endocrinology referral.  Patient says he is doing well.  Has had no flare of cough or wheezing.  Energy level has not improved, very fatigues with low energy and activity tolerance . Joint aches are better.   Weight is up 40lbs in last 1.5 yrs.   He Snores , feels tired all the time. Daytime sleepiness. [place] today is     No Known Allergies   There is no immunization history on file for this patient.  Past Medical History:  Diagnosis Date  . Asthma   . Eosinophilia   . Pulmonary nodules     Tobacco History: Social History   Tobacco Use  Smoking Status Former Smoker  . Packs/day: 0.25  .  Years: 10.00  . Pack years: 2.50  . Types: Cigarettes  . Quit date: 2000  . Years since quitting: 20.7  Smokeless Tobacco Never Used   Counseling given: Not Answered   Outpatient Medications Prior to Visit  Medication Sig Dispense Refill  . albuterol (PROVENTIL HFA;VENTOLIN HFA) 108 (90 Base) MCG/ACT inhaler  Inhale 2 puffs into the lungs every 4 (four) hours as needed for wheezing or shortness of breath (cough, shortness of breath or wheezing.). 1 Inhaler 5  . albuterol (PROVENTIL) (2.5 MG/3ML) 0.083% nebulizer solution Take 3 mLs (2.5 mg total) by nebulization every 4 (four) hours as needed for wheezing or shortness of breath. 150 mL 5  . cetirizine (ZYRTEC) 10 MG tablet Take 10 mg by mouth daily.    . Dupilumab (DUPIXENT) 300 MG/2ML SOPN Inject 300 mg into the skin every 14 (fourteen) days.    Marland Kitchen EPINEPHrine 0.3 mg/0.3 mL IJ SOAJ injection Inject one dose intramuscularly for allergic reaction. May repeat one dose if needed after 5-15 minutes. Proceed to the ER  11  . fluticasone (FLONASE) 50 MCG/ACT nasal spray Place 2 sprays into both nostrils daily.    . Fluticasone-Umeclidin-Vilant 100-62.5-25 MCG/INH AEPB Inhale 1 puff into the lungs daily.     . hydrocortisone (CORTEF) 10 MG tablet Take 2 tablets every morning and 1 tablet every evening 90 tablet 5  . montelukast (SINGULAIR) 10 MG tablet Take 1 tablet (10 mg total) by mouth at bedtime. 30 tablet 5  . omeprazole (PRILOSEC) 20 MG capsule Take 1 capsule (20 mg total) by mouth 2 (two) times daily before a meal. (Patient taking differently: Take 20 mg by mouth daily. ) 60 capsule 1  . predniSONE (DELTASONE) 5 MG tablet Take 5 mg by mouth daily with breakfast.     No facility-administered medications prior to visit.      Review of Systems:   Constitutional:   No  weight loss, night sweats,  Fevers, chills,  +fatigue, or  lassitude.  HEENT:   No headaches,  Difficulty swallowing,  Tooth/dental problems, or  Sore throat,                No sneezing, itching, ear ache,  +nasal congestion, post nasal drip,   CV:  No chest pain,  Orthopnea, PND, swelling in lower extremities, anasarca, dizziness, palpitations, syncope.   GI  No heartburn, indigestion, abdominal pain, nausea, vomiting, diarrhea, change in bowel habits, loss of appetite, bloody  stools.   Resp: No shortness of breath with exertion or at rest.  No excess mucus, no productive cough,  No non-productive cough,  No coughing up of blood.  No change in color of mucus.  No wheezing.  No chest wall deformity  Skin: no rash or lesions.  GU: no dysuria, change in color of urine, no urgency or frequency.  No flank pain, no hematuria   MS:  No joint pain or swelling.  No decreased range of motion.  No back pain.    Physical Exam  BP 136/74 (BP Location: Left Arm, Cuff Size: Normal)   Pulse 95   Ht _0  (1.778 m)   Wt 270 lb 12.8 oz (122.8 kg)   SpO2 96%   BMI 38.86 kg/m   GEN: A/Ox3; pleasant , NAD, obese per BMI    HEENT:  [place]/AT,   , NOSE-clear, THROAT-clear, no lesions, no postnasal drip or exudate noted.  Class III-IV MP airway  NECK:  Supple w/ fair ROM; no JVD; normal carotid  impulses w/o bruits; no thyromegaly or nodules palpated; no lymphadenopathy.    RESP  Clear  P & A; w/o, wheezes/ rales/ or rhonchi. no accessory muscle use, no dullness to percussion  CARD:  RRR, no m/r/g, no peripheral edema, pulses intact, no cyanosis or clubbing.  GI:   Soft & nt; nml bowel sounds; no organomegaly or masses detected.   Musco: Warm bil, no deformities or joint swelling noted.   Neuro: alert, no focal deficits noted.    Skin: Warm, no lesions or rashes    Lab Results:  CBC    Component Value Date/Time   WBC 9.8 03/19/2019 1057   RBC 5.32 03/19/2019 1057   HGB 16.9 03/19/2019 1057   HCT 51.5 03/19/2019 1057   PLT 189 03/19/2019 1057   MCV 96.8 03/19/2019 1057   MCH 31.8 03/19/2019 1057   MCHC 32.8 03/19/2019 1057   RDW 12.7 03/19/2019 1057   LYMPHSABS 2.8 03/19/2019 1057   MONOABS 0.7 03/19/2019 1057   EOSABS 0.6 (H) 03/19/2019 1057   BASOSABS 0.1 03/19/2019 1057    BMET    Component Value Date/Time   NA 140 03/19/2019 1057   K 4.0 03/19/2019 1057   CL 106 03/19/2019 1057   CO2 25 03/19/2019 1057   GLUCOSE 105 (H) 03/19/2019 1057   BUN 13  03/19/2019 1057   CREATININE 1.17 03/19/2019 1057   CALCIUM 8.8 (L) 03/19/2019 1057   GFRNONAA >60 03/19/2019 1057   GFRAA >60 03/19/2019 1057    BNP No results found for: BNP  ProBNP    Component Value Date/Time   PROBNP 14.0 12/04/2017 1548    Imaging: No results found.  dupilumab (DUPIXENT) prefilled syringe 300 mg    Date Action Dose Route User   03/03/2019 1407 Given 300 mg Subcutaneous (Right Arm) Elton Sin, LPN    dupilumab (DUPIXENT) prefilled syringe 300 mg    Date Action Dose Route User   03/23/2019 1429 Given 300 mg Subcutaneous (Right Arm) Randa Spike, CMA    dupilumab (DUPIXENT) prefilled syringe 300 mg    Date Action Dose Route User   04/06/2019 1342 Given 300 mg Subcutaneous (Left Arm) Randa Spike, CMA      PFT Results Latest Ref Rng & Units 12/29/2017  FVC-Pre L 4.52  FVC-Predicted Pre % 86  FVC-Post L 4.93  FVC-Predicted Post % 94  Pre FEV1/FVC % % 74  Post FEV1/FCV % % 77  FEV1-Pre L 3.34  FEV1-Predicted Pre % 81  FEV1-Post L 3.78  DLCO UNC% % 95  DLCO COR %Predicted % 106  TLC L 6.39  TLC % Predicted % 90  RV % Predicted % 92    Lab Results  Component Value Date   NITRICOXIDE 17 12/12/2017        Assessment & Plan:   Asthma 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  We will continue on aggressive regimen with Trelegy, Singulair, Zyrtec and Flonase. If having difficulty weaning off of steroids with suspected possible underlying adrenal insufficiency versus possible long-term steroid side effects.  We will continue to wean hydrocortisone.  Refer to endocrinology. Patient does have significant fatigue and daytime sleepiness.  We will also check for sleep apnea with a home sleep study. Recent lab work with a CBC and be met was unrevealing. Most likely will need a TSH but will leave this to endocrinology. Declines flu shot  Plan  Patient  Instructions  Continue on  Dupixent .  Refer to Endocrinology .  Decrease Hydrocortisone 55m Twice daily  .  Continue on TRELEGY , 1 puff daily , rinse after use  Albuterol inhaler or nebs As needed  Wheezing .  Continue on Singulair 114mdaily  Continue on Zyrtec 1045mt bedtime  .  Flonase Nasal daily  Activity as tolerated. Advance as able  Work on [employer] on healthy weight loss.  Refer to Primary Care .  Follow up with Dr. ByrLamonte Sakai Parrett NP in 4- weeks and As needed   Please contact office for sooner follow up if symptoms do not improve or worsen or seek emergency care       Allergic rhinitis Continue on preventative regimen.  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  Patient has been on steroids for 1.5 years.  Having difficulty slowly weaning off.  Has recently been changed from prednisone 2.5 mg to hydrocortisone.  He is currently on 20 mg in the morning and 38m76mm  Will decrease to 10 mg twice daily.  Refer to endocrinology.  Morbid obesity (HCC)[place]  Daytime sleepiness Suspected underlying obstructive sleep apnea with multiple risk factors including morbid obesity,  class III-IV MP airway, daytime sleepiness, snoring  Check home sleep study     TammRexene Edison 04/20/2019

## 2019-04-20 NOTE — Assessment & Plan Note (Signed)
Suspected adrenal insufficiency.  Patient has been on steroids for 1.5 years.  Having difficulty slowly weaning off.  Has recently been changed from prednisone 2.5 mg to hydrocortisone.  He is currently on 20 mg in the morning and 10mg  qpm  Will decrease to 10 mg twice daily.  Refer to endocrinology.

## 2019-04-20 NOTE — Assessment & Plan Note (Addendum)
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  We will continue on aggressive regimen with Trelegy, Singulair, Zyrtec and Flonase. If having difficulty weaning off of steroids with suspected possible underlying adrenal insufficiency versus possible long-term steroid side effects.  We will continue to wean hydrocortisone.  Refer to endocrinology. Patient does have significant fatigue and daytime sleepiness.  We will also check for sleep apnea with a home sleep study. Recent lab work with a CBC and be met was unrevealing. Most likely will need a TSH but will leave this to endocrinology. Declines flu shot  Plan  Patient Instructions  Continue on Dupixent .  Refer to Endocrinology .  Decrease Hydrocortisone '10mg'$  Twice daily  .  Continue on TRELEGY , 1 puff daily , rinse after use  Albuterol inhaler or nebs As needed  Wheezing .  Continue on Singulair '10mg'$  daily  Continue on Zyrtec '10mg'$  At bedtime  .  Flonase Nasal daily  Activity as tolerated. Advance as able  Work on [employer] . Work on healthy weight loss.  Refer to Primary Care .  Follow up with Dr. Lamonte Sakai or [name] in 4- weeks and As needed   Please contact office for sooner follow up if symptoms do not improve or worsen or seek emergency care

## 2019-04-20 NOTE — Progress Notes (Signed)
Have you been hospitalized within the last 10 days?  No Do you have a fever?  No Do you have a cough?  No Do you have a headache or sore throat? No  

## 2019-04-20 NOTE — Telephone Encounter (Signed)
Forms will be given to RB when he returns to the office.

## 2019-04-20 NOTE — Assessment & Plan Note (Signed)
Healthy weight loss 

## 2019-04-23 NOTE — Telephone Encounter (Signed)
I calle dVern and left a message advising her that I placed forms in the mail today. Nothing further is needed.

## 2019-04-23 NOTE — Telephone Encounter (Signed)
I have these forms and will place in the mail today to [employer].

## 2019-04-26 ENCOUNTER — Telehealth: Payer: Self-pay | Admitting: Emergency Medicine

## 2019-04-26 NOTE — Telephone Encounter (Signed)
Dupixent Order: 300mg  #2 prefilled syringe Ordered Date: 04/26/19 Expected date of arrival: 04/28/19 Ordered by: Petal: Lennette Bihari

## 2019-04-27 ENCOUNTER — Telehealth: Payer: Self-pay | Admitting: Emergency Medicine

## 2019-04-27 NOTE — Telephone Encounter (Signed)
Spoke with Dr. Halford Chessman at [phone]. He stated that he would like to speak with Dr. Lamonte Sakai in regards to the patient's work restrictions. Verified that the number mentioned above was a contact number. Advised him that RB was in clinic.    Will route to Dr. Lamonte Sakai.

## 2019-04-28 NOTE — Telephone Encounter (Signed)
Dupixent Shipment Received: 300mg #2 prefilled syringe Medication arrival date: 04/28/19 Lot #: [id number] Exp date: 10/13/2021 Received by: Belisa Eichholz,LPN 

## 2019-04-29 ENCOUNTER — Telehealth: Payer: Self-pay | Admitting: Emergency Medicine

## 2019-04-29 NOTE — Telephone Encounter (Signed)
See previous message

## 2019-04-30 NOTE — Telephone Encounter (Signed)
Please note the original message from insurance carrier is from Dr. Sharmon Leyden.  My name is Dr. Chesley Mires.  We are different people.  I have no relationship to insurance carrier.  Will route back to Dr. Lamonte Sakai to follow up with Dr. Sharmon Leyden.

## 2019-04-30 NOTE — Telephone Encounter (Signed)
Called Dr Halford Chessman, left message. Will try him again

## 2019-04-30 NOTE — Telephone Encounter (Signed)
Dr. Lamonte Sakai, please advise if this has been done already. Thanks!

## 2019-04-30 NOTE — Telephone Encounter (Signed)
Dr. Halford Chessman returning call for Dr. Lamonte Sakai.  [phone] regarding work restrictions.

## 2019-05-04 ENCOUNTER — Ambulatory Visit: Payer: 59

## 2019-05-04 ENCOUNTER — Other Ambulatory Visit: Payer: Self-pay

## 2019-05-04 ENCOUNTER — Ambulatory Visit (INDEPENDENT_AMBULATORY_CARE_PROVIDER_SITE_OTHER): Payer: 59

## 2019-05-04 DIAGNOSIS — R400 Somnolence: Secondary | ICD-10-CM

## 2019-05-04 DIAGNOSIS — R0683 Snoring: Secondary | ICD-10-CM

## 2019-05-04 DIAGNOSIS — E273 Drug-induced adrenocortical insufficiency: Secondary | ICD-10-CM | POA: Diagnosis not present

## 2019-05-04 DIAGNOSIS — J4550 Severe persistent asthma, uncomplicated: Secondary | ICD-10-CM

## 2019-05-04 MED ORDER — DUPILUMAB 300 MG/2ML ~~LOC~~ SOSY
300.0000 mg | PREFILLED_SYRINGE | Freq: Once | SUBCUTANEOUS | Status: AC
  Administered 2019-05-04: 300 mg via SUBCUTANEOUS

## 2019-05-04 NOTE — Progress Notes (Signed)
All questions were answered by the patient before medication was administered. Have you been hospitalized in the last 10 days? No Do you have a fever? No Do you have a cough? No Do you have a headache or sore throat? No  

## 2019-05-05 ENCOUNTER — Ambulatory Visit (INDEPENDENT_AMBULATORY_CARE_PROVIDER_SITE_OTHER): Payer: 59 | Admitting: "Endocrinology

## 2019-05-05 ENCOUNTER — Encounter: Payer: Self-pay | Admitting: "Endocrinology

## 2019-05-05 VITALS — BP 123/85 | HR 78 | Ht 70.0 in | Wt 268.0 lb

## 2019-05-05 DIAGNOSIS — E2740 Unspecified adrenocortical insufficiency: Secondary | ICD-10-CM | POA: Diagnosis not present

## 2019-05-05 DIAGNOSIS — G4733 Obstructive sleep apnea (adult) (pediatric): Secondary | ICD-10-CM

## 2019-05-05 MED ORDER — HYDROCORTISONE NA SUCCINATE PF 250 MG IJ SOLR: 250.0000 mg | Freq: Once | INTRAMUSCULAR | 99 refills | Status: AC

## 2019-05-05 MED ORDER — HYDROCORTISONE 10 MG PO TABS: ORAL_TABLET | ORAL | 6 refills | Status: DC

## 2019-05-05 NOTE — Telephone Encounter (Signed)
Spoke with Dr Halford Chessman today. He is recommending that pt may not be able to go back to work at previous level of exertion (he was a Radiation protection practitioner), may need something less intense. Also he is awaiting Endocrine consultation for their input.

## 2019-05-05 NOTE — Progress Notes (Signed)
Endocrinology Consult Note                                            05/05/2019, 5:08 PM   Subjective:    Patient ID: Jose Ross, male    DOB: 17-Jun-1971, PCP Patient, No Pcp Per   Past Medical History:  Diagnosis Date  . Asthma   . Eosinophilia   . Pulmonary nodules    History reviewed. No pertinent surgical history. Social History   Socioeconomic History  . Marital status: Single    Spouse name: Not on file  . Number of children: 2  . Years of education: Not on file  . Highest education level: Not on file  Occupational History  . Not on file  Social Needs  . Financial resource strain: Not hard at all  . Food insecurity    Worry: Never true    Inability: Never true  . Transportation needs    Medical: No    Non-medical: No  Tobacco Use  . Smoking status: Former Smoker    Packs/day: 0.25    Years: 10.00    Pack years: 2.50    Types: Cigarettes    Quit date: 2000    Years since quitting: 20.8  . Smokeless tobacco: Never Used  Substance and Sexual Activity  . Alcohol use: Yes    Comment: 4-5 drinks a week  . Drug use: Not Currently  . Sexual activity: Not on file  Lifestyle  . Physical activity    Days per week: 3 days    Minutes per session: 30 min  . Stress: Only a little  Relationships  . Social connections    Talks on phone: More than three times a week    Gets together: Three times a week    Attends religious service: Never    Active member of club or organization: No    Attends meetings of clubs or organizations: Never    Relationship status: Divorced  Other Topics Concern  . Not on file  Social History Narrative  . Not on file   Family History  Problem Relation Age of Onset  . Asthma Mother   . Skin cancer Mother    Outpatient Encounter Medications as of 05/05/2019  Medication Sig  . albuterol (PROVENTIL HFA;VENTOLIN HFA) 108 (90 Base) MCG/ACT inhaler Inhale 2 puffs into the lungs every 4 (four) hours as needed for wheezing  or shortness of breath (cough, shortness of breath or wheezing.).  Marland Kitchen albuterol (PROVENTIL) (2.5 MG/3ML) 0.083% nebulizer solution Take 3 mLs (2.5 mg total) by nebulization every 4 (four) hours as needed for wheezing or shortness of breath.  . cetirizine (ZYRTEC) 10 MG tablet Take 10 mg by mouth daily.  . Dupilumab (DUPIXENT) 300 MG/2ML SOPN Inject 300 mg into the skin every 14 (fourteen) days.  Marland Kitchen EPINEPHrine 0.3 mg/0.3 mL IJ SOAJ injection Inject one dose intramuscularly for allergic reaction. May repeat one dose if needed after 5-15 minutes. Proceed to the ER  . fluticasone (FLONASE) 50 MCG/ACT nasal spray Place 2 sprays into both nostrils daily.  . Fluticasone-Umeclidin-Vilant 100-62.5-25 MCG/INH AEPB Inhale 1 puff into the lungs daily.   . hydrocortisone (CORTEF) 10 MG tablet Take 1 and 1/2 tablets every morning and 1 tablet every evening  . montelukast (SINGULAIR) 10 MG tablet Take 1 tablet (10 mg total) by mouth  at bedtime.  Marland Kitchen. omeprazole (PRILOSEC) 20 MG capsule Take 1 capsule (20 mg total) by mouth 2 (two) times daily before a meal. (Patient taking differently: Take 20 mg by mouth daily. )  . [DISCONTINUED] hydrocortisone (CORTEF) 10 MG tablet Take 2 tablets every morning and 1 tablet every evening   No facility-administered encounter medications on file as of 05/05/2019.    ALLERGIES: No Known Allergies  VACCINATION STATUS:  There is no immunization history on file for this patient.  HPI Jose Ross is 48 y.o. male who presents today with a medical history as above. he is being seen in consultation for steroids induced adrenal insufficiency requested by Rubye Oaksammy Parrett,  NP.  Patient has significant medical history of eosinophilic pneumonia.  Required treatment with high-dose steroid involving prednisone daily dose of 40 to 60 mg for at least 18 months continuously.  His last exposure to prednisone was a month ago, subsequently switched to hydrocortisone 20-30 mg daily.  He did not  tolerate an attempt to taper off steroids which caused fainting, headache, and general weakness. He does not have family history of adrenal, pituitary, thyroid dysfunction.  He is concerned about the progressive weight gain he has. -He is currently on hydrocortisone 10 mg p.o. twice daily.    Review of Systems  Constitutional:  + Fluctuating body weight, + fatigue, no subjective hyperthermia, no subjective hypothermia Eyes: no blurry vision, no xerophthalmia ENT: no sore throat, no nodules palpated in throat, no dysphagia/odynophagia, no hoarseness Cardiovascular: no Chest Pain, no Shortness of Breath, no palpitations, no leg swelling Respiratory: no cough, no shortness of breath Gastrointestinal: no Nausea/Vomiting/Diarhhea Musculoskeletal: no muscle/joint aches Skin: no rashes Neurological: no tremors, no numbness, no tingling, no dizziness Psychiatric: no depression, no anxiety  Objective:    BP 123/85   Pulse 78   Ht 5\' 10"  (1.778 m)   Wt 268 lb (121.6 kg)   BMI 38.45 kg/m   Wt Readings from Last 3 Encounters:  05/05/19 268 lb (121.6 kg)  04/20/19 270 lb 12.8 oz (122.8 kg)  04/06/19 270 lb (122.5 kg)    Physical Exam  Constitutional:  Body mass index is 38.45 kg/m.,  not in acute distress, normal state of mind Eyes: PERRLA, EOMI, no exophthalmos ENT: moist mucous membranes, no gross thyromegaly, no gross cervical lymphadenopathy Cardiovascular: normal precordial activity, Regular Rate and Rhythm, no Murmur/Rubs/Gallops Respiratory:  adequate breathing efforts, no gross chest deformity, Clear to auscultation bilaterally Gastrointestinal: abdomen soft, Non -tender, No distension, Bowel Sounds present, no gross organomegaly Musculoskeletal: no gross deformities, strength intact in all four extremities Skin: moist, warm, no rashes Neurological: no tremor with outstretched hands, Deep tendon reflexes normal in bilateral lower extremities.  CMP ( most recent) CMP      Component Value Date/Time   NA 140 03/19/2019 1057   K 4.0 03/19/2019 1057   CL 106 03/19/2019 1057   CO2 25 03/19/2019 1057   GLUCOSE 105 (H) 03/19/2019 1057   BUN 13 03/19/2019 1057   CREATININE 1.17 03/19/2019 1057   CALCIUM 8.8 (L) 03/19/2019 1057   PROT 6.6 03/19/2019 1057   ALBUMIN 3.8 03/19/2019 1057   AST 25 03/19/2019 1057   ALT 34 03/19/2019 1057   ALKPHOS 111 03/19/2019 1057   BILITOT 0.6 03/19/2019 1057   GFRNONAA >60 03/19/2019 1057   GFRAA >60 03/19/2019 1057      Assessment & Plan:   1. Adrenal insufficiency (HCC) - Jose SanfilippoFrederick Ross  is being seen at a kind request of  Rubye Oaks, NP. - I have reviewed his available endocrine records and clinically evaluated the patient. - Based on these reviews, he has high-dose steroids induced adrenal insufficiency.  Historically, he did not tolerate tapering off of steroids. -Given the fact he was treated with high-dose steroids for at least 18 months, he will likely continue to require steroid replacement for long-term.  He is approached for slight tapering of hydrocortisone to 15 mg every day at 8 AM and 10 mg every day at 1 PM . -At this time, a diagnosis of adrenal insufficiency seems to be very likely, it is unsafe to withdraw thyroid replacement for investigation purposes.  -He is educated on steroids, as well as emergency rescue steroids were prescribed.   -His blood pressure is stable at this time, 123/85, may require fludrocortisone if he starts to drop blood pressure below normal. -He is advised to wear a medical alert necklace or wrist lace depicting  his adrenal insufficiency and the need for steroids.  He is counseled against smoking.  For his weight concern: - he  admits there is a room for improvement in his diet and drink choices. -  Suggestion is made for him to avoid simple carbohydrates  from his diet including Cakes, Sweet Desserts / Pastries, Ice Cream, Soda (diet and regular), Sweet Tea, Candies,  Chips, Cookies, Sweet Pastries,  Store Bought Juices, Alcohol in Excess of  1-2 drinks a day, Artificial Sweeteners, Coffee Creamer, and "Sugar-free" Products. This will help patient to have stable blood glucose profile and potentially avoid unintended weight gain.  - he is advised to maintain close follow up with  Rubye Oaks, NP  for primary care needs.   - Time spent with the patient: 60 minutes, of which >50% was spent in obtaining information about his symptoms, reviewing his previous labs/studies,  evaluations, and treatments, counseling him about his steroids induced adrenal sufficiency, obesity, and developing a plan to confirm the diagnosis and long term treatment based on the latest standards of care/guidelines.    Jose Sanfilippo participated in the discussions, expressed understanding, and voiced agreement with the above plans.  All questions were answered to his satisfaction. he is encouraged to contact clinic should he have any questions or concerns prior to his return visit.  Follow up plan: Return in about 3 months (around 08/05/2019) for Follow up with Pre-visit Labs, Next Visit A1c in Office.   Marquis Lunch, MD [facility] [address] [place], [place] [address] Phone: [phone]  Fax: [phone]     05/05/2019, 5:08 PM  This note was partially dictated with voice recognition software. Similar sounding words can be transcribed inadequately or may not  be corrected upon review.

## 2019-05-05 NOTE — Patient Instructions (Signed)

## 2019-05-05 NOTE — Progress Notes (Signed)
Has been off of Prednisone for 1 month

## 2019-05-06 ENCOUNTER — Encounter: Payer: Self-pay | Admitting: General Practice

## 2019-05-11 ENCOUNTER — Telehealth: Payer: Self-pay | Admitting: Emergency Medicine

## 2019-05-11 ENCOUNTER — Telehealth: Payer: Self-pay | Admitting: Adult Health

## 2019-05-11 DIAGNOSIS — G4733 Obstructive sleep apnea (adult) (pediatric): Secondary | ICD-10-CM | POA: Diagnosis not present

## 2019-05-11 NOTE — Telephone Encounter (Signed)
-----   Message from Chesley Mires, MD sent at 05/11/2019 12:52 PM EDT ----- Jose Ross,  Home sleep study from 05/05/19 showed moderate OSA with AHI 27.4 and SpO2 low 75%.  Looks like he follows Rob in the office.  Thanks.  [place]

## 2019-05-11 NOTE — Telephone Encounter (Signed)
Will go ahead and place order for CPAP machine.

## 2019-05-11 NOTE — Telephone Encounter (Signed)
Error

## 2019-05-11 NOTE — Telephone Encounter (Signed)
Home sleep study from 05/05/19 showed moderate OSA with AHI 27.4 and SpO2 low 75%.   Discussed results with patient with patient education on sleep apnea and potential treatment options including weight loss, oral appliance and CPAP.  Patient would like to proceed with a CPAP.  Please set patient up for a CPAP-AutoSet 5 to 15 cm H2O. Use CPAP at night at least 4 to 6 hours. Mask of choice.  Follow-up in the office as planned in November with Dr. Lamonte Sakai.  CPAP download on return

## 2019-05-18 ENCOUNTER — Ambulatory Visit: Payer: 59

## 2019-05-19 ENCOUNTER — Other Ambulatory Visit: Payer: Self-pay

## 2019-05-19 ENCOUNTER — Ambulatory Visit (INDEPENDENT_AMBULATORY_CARE_PROVIDER_SITE_OTHER): Payer: 59

## 2019-05-19 DIAGNOSIS — J4550 Severe persistent asthma, uncomplicated: Secondary | ICD-10-CM

## 2019-05-19 MED ORDER — DUPILUMAB 300 MG/2ML ~~LOC~~ SOSY
300.0000 mg | PREFILLED_SYRINGE | Freq: Once | SUBCUTANEOUS | Status: AC
  Administered 2019-05-19: 300 mg via SUBCUTANEOUS

## 2019-05-19 NOTE — Progress Notes (Signed)
All questions were answered by the patient before medication was administered. Have you been hospitalized in the last 10 days? No Do you have a fever? No Do you have a cough? No Do you have a headache or sore throat? No  

## 2019-05-25 ENCOUNTER — Telehealth: Payer: Self-pay | Admitting: Emergency Medicine

## 2019-05-25 NOTE — Telephone Encounter (Signed)
Dupixent Order: 300mg #2 prefilled syringe Ordered Date: 05/25/2019 Expected date of arrival: 05/28/2019 Ordered by: Shantanu Strauch, CMA  Specialty Pharmacy: [facility]  

## 2019-05-28 NOTE — Telephone Encounter (Signed)
Dupixent Shipment Received: 300mg #2 prefilled syringe Medication arrival date: 05/28/2019 Lot #: [id number] Exp date: 10/2021 Received by: Lindsay Lemons, CMA   

## 2019-06-02 ENCOUNTER — Ambulatory Visit (INDEPENDENT_AMBULATORY_CARE_PROVIDER_SITE_OTHER): Payer: 59

## 2019-06-02 ENCOUNTER — Other Ambulatory Visit: Payer: Self-pay

## 2019-06-02 DIAGNOSIS — J4550 Severe persistent asthma, uncomplicated: Secondary | ICD-10-CM | POA: Diagnosis not present

## 2019-06-02 MED ORDER — DUPILUMAB 300 MG/2ML ~~LOC~~ SOSY
300.0000 mg | PREFILLED_SYRINGE | Freq: Once | SUBCUTANEOUS | Status: AC
  Administered 2019-06-02: 300 mg via SUBCUTANEOUS

## 2019-06-02 NOTE — Progress Notes (Signed)
Have you been hospitalized within the last 10 days?  No Do you have a fever?  No Do you have a cough?  No Do you have a headache or sore throat? No  

## 2019-06-14 ENCOUNTER — Other Ambulatory Visit: Payer: Self-pay

## 2019-06-14 ENCOUNTER — Encounter: Payer: Self-pay | Admitting: Emergency Medicine

## 2019-06-14 ENCOUNTER — Ambulatory Visit: Payer: 59 | Admitting: Emergency Medicine

## 2019-06-14 DIAGNOSIS — J4550 Severe persistent asthma, uncomplicated: Secondary | ICD-10-CM

## 2019-06-14 DIAGNOSIS — E273 Drug-induced adrenocortical insufficiency: Secondary | ICD-10-CM | POA: Diagnosis not present

## 2019-06-14 DIAGNOSIS — R0602 Shortness of breath: Secondary | ICD-10-CM

## 2019-06-14 DIAGNOSIS — T380X5A Adverse effect of glucocorticoids and synthetic analogues, initial encounter: Secondary | ICD-10-CM

## 2019-06-14 MED ORDER — FUROSEMIDE 20 MG PO TABS: 20.0000 mg | ORAL_TABLET | Freq: Every day | ORAL | 0 refills | Status: DC

## 2019-06-14 NOTE — Assessment & Plan Note (Signed)
He is having progressive dyspnea over the last few months even on the Dupixent.  Etiology unclear, concerning for possible breakthrough asthma even on immunotherapy.  We did come off of his low-dose prednisone over this time but he has been on hydrocortisone.  Consider whether some of this may be due to initiation of CPAP-it appears that the dyspnea preceded initiation.  He has lower extremity edema, possibly some secondary pulmonary hypertension.  An echocardiogram from 2019 was reassuring, he may need a repeat going forward.  I will give him 4 days of furosemide to see if this helps both the edema and his dyspnea.  I will continue his current bronchodilator regimen, allergy regimen.

## 2019-06-14 NOTE — Assessment & Plan Note (Signed)
Continue his current regimen, I am hopeful that other contributors to dyspnea may be driving his current symptoms.  Otherwise this is concerning for breakthrough obstructive lung disease even on his Dupixent.

## 2019-06-14 NOTE — Patient Instructions (Signed)
Please continue Trelegy once daily. Continue Zyrtec, Flonase, Singulair, Prilosec as you have been taking them. Continue to get your Dupixent treatments as currently scheduled. Please continue hydrocortisone 15 mg morning, 10 mg evening Please take Lasix 20 mg daily for the next 4 days, then stop. Please continue your CPAP use every night.  We will follow-up going forward to assess how well you have been able to wear and whether it has helped you. Follow with Dr Lamonte Sakai or T. Parrett in 1 month

## 2019-06-14 NOTE — Assessment & Plan Note (Signed)
He has seen endocrinology.  Continuing hydrocortisone 15 mg a.m., 10 mg p.m.  Hopefully he will ultimately be able to come off steroids altogether.

## 2019-06-14 NOTE — Assessment & Plan Note (Signed)
Diagnosed by home sleep study AHI 27.4/h.  He has started CPAP therapy with mixed results so far.  He is working on compliance.

## 2019-06-14 NOTE — Progress Notes (Signed)
Subjective:    Patient ID: Jose Ross, male    DOB: 1970-07-30, 48 y.o.   MRN: [id number]  Asthma He complains of cough, shortness of breath and wheezing. Associated symptoms include headaches and sneezing. Pertinent negatives include no ear pain, fever, postnasal drip, rhinorrhea, sore throat or trouble swallowing. His past medical history is significant for asthma.  Shortness of Breath Associated symptoms include headaches and wheezing. Pertinent negatives include no ear pain, fever, leg swelling, rash, rhinorrhea, sore throat or vomiting. His past medical history is significant for asthma.    ROV 06/14/2019 --follow-up visit for Mr. Jose Ross, 48 year old gentleman with moderate persistent asthma, hypereosinophilia, allergic rhinitis all of which have been difficult to treat.  He was prednisone dependent for months but finally we were able to begin tapering after he started Dupixent.  When we attempted to get his prednisone off completely he experienced significant fatigue, low energy concerning for possible adrenal insufficiency.  Currently on hydrocortisone 15/10 mg, remains on Trelegy, Zyrtec, Flonase, Singulair, Prilosec.  He underwent a home sleep study on 05/05/2019 which I reviewed, shows an AHI of 27/h - started on CPAP ([facility]) a couple weeks ago.  He has felt intermittent chest tightness for last couple months, has had some nocturnal awakenings. Hearing some wheeze.  He is not back to work yet. Energy level is a bit better but not normal.     Review of Systems  Constitutional: Negative for fever and unexpected weight change.  HENT: Positive for congestion, nosebleeds, sinus pressure and sneezing. Negative for dental problem, ear pain, postnasal drip, rhinorrhea, sore throat and trouble swallowing.   Eyes: Negative for redness and itching.  Respiratory: Positive for cough, chest tightness, shortness of breath and wheezing.   Cardiovascular: Negative for palpitations and leg  swelling.  Gastrointestinal: Negative for nausea and vomiting.  Genitourinary: Negative for dysuria.  Musculoskeletal: Negative for joint swelling.  Skin: Negative for rash.  Allergic/Immunologic: Positive for environmental allergies. Negative for food allergies and immunocompromised state.  Neurological: Positive for headaches.  Hematological: Does not bruise/bleed easily.  Psychiatric/Behavioral: Negative for dysphoric mood. The patient is not nervous/anxious.         Objective:   Physical Exam Vitals:   06/14/19 1343  BP: 126/90  Pulse: 83  Temp: 97.9 F (36.6 C)  TempSrc: Oral  SpO2: 100%  Weight: 280 lb 12.8 oz (127.4 kg)  Height: 5\' 10"  (1.778 m)   Gen: Pleasant, well-nourished, comfortable respiratory pattern today, normal affect, developing some cushingoid facies  ENT: No lesions,  mouth clear,  oropharynx clear but crowded   Neck: No JVD, no stridor with a normal breath, no wheeze with normal breath or on a forced exp   Lungs: No use of accessory muscles, clear to auscultation  Cardiovascular: RRR, heart sounds normal, no murmur or gallops, no peripheral edema  Musculoskeletal: No deformities, no cyanosis or clubbing  Neuro: alert, non focal  Skin: Warm, no lesions or rash     Assessment & Plan:  Dyspnea He is having progressive dyspnea over the last few months even on the Dupixent.  Etiology unclear, concerning for possible breakthrough asthma even on immunotherapy.  We did come off of his low-dose prednisone over this time but he has been on hydrocortisone.  Consider whether some of this may be due to initiation of CPAP-it appears that the dyspnea preceded initiation.  He has lower extremity edema, possibly some secondary pulmonary hypertension.  An echocardiogram from 2019 was reassuring, he may need a repeat  going forward.  I will give him 4 days of furosemide to see if this helps both the edema and his dyspnea.  I will continue his current bronchodilator  regimen, allergy regimen.  Obstructive sleep apnea Diagnosed by home sleep study AHI 27.4/h.  He has started CPAP therapy with mixed results so far.  He is working on compliance.  Asthma Continue his current regimen, I am hopeful that other contributors to dyspnea may be driving his current symptoms.  Otherwise this is concerning for breakthrough obstructive lung disease even on his Dupixent.  Adrenal insufficiency due to steroid withdrawal [facility]) He has seen endocrinology.  Continuing hydrocortisone 15 mg a.m., 10 mg p.m.  Hopefully he will ultimately be able to come off steroids altogether.  Levy Pupa, MD, PhD 06/14/2019, 2:18 PM [facility] [phone] or if no answer [phone]

## 2019-06-16 ENCOUNTER — Other Ambulatory Visit: Payer: Self-pay

## 2019-06-16 ENCOUNTER — Ambulatory Visit (INDEPENDENT_AMBULATORY_CARE_PROVIDER_SITE_OTHER): Payer: 59

## 2019-06-16 DIAGNOSIS — J4550 Severe persistent asthma, uncomplicated: Secondary | ICD-10-CM | POA: Diagnosis not present

## 2019-06-16 MED ORDER — DUPILUMAB 300 MG/2ML ~~LOC~~ SOSY
300.0000 mg | PREFILLED_SYRINGE | Freq: Once | SUBCUTANEOUS | Status: AC
  Administered 2019-06-16: 300 mg via SUBCUTANEOUS

## 2019-06-16 NOTE — Progress Notes (Signed)
Have you been hospitalized within the last 10 days?  No Do you have a fever?  No Do you have a cough?  No Do you have a headache or sore throat? No Do you have your Epi Pen visible and is it within date?  No 

## 2019-06-21 ENCOUNTER — Other Ambulatory Visit: Payer: Self-pay | Admitting: Emergency Medicine

## 2019-06-22 ENCOUNTER — Telehealth: Payer: Self-pay | Admitting: Emergency Medicine

## 2019-06-22 NOTE — Telephone Encounter (Signed)
Dupixent Order: 300mg #2 prefilled syringe Ordered Date: 06/22/19 Expected date of arrival: 06/24/19 Ordered by: Tecumseh Yeagley,LPN Specialty Pharmacy: [facility] 

## 2019-06-24 NOTE — Telephone Encounter (Signed)
Dupixent Shipment Received: 300mg #2 prefilled syringe Medication arrival date: 06/24/19 Lot #: [id number] Exp date: 11/12/2021 Received by: Keilon Ressel,LPN 

## 2019-06-30 ENCOUNTER — Other Ambulatory Visit: Payer: Self-pay

## 2019-06-30 ENCOUNTER — Ambulatory Visit (INDEPENDENT_AMBULATORY_CARE_PROVIDER_SITE_OTHER): Payer: 59

## 2019-06-30 DIAGNOSIS — J4550 Severe persistent asthma, uncomplicated: Secondary | ICD-10-CM

## 2019-06-30 MED ORDER — DUPILUMAB 300 MG/2ML ~~LOC~~ SOSY
300.0000 mg | PREFILLED_SYRINGE | Freq: Once | SUBCUTANEOUS | Status: AC
  Administered 2019-06-30: 300 mg via SUBCUTANEOUS

## 2019-06-30 NOTE — Progress Notes (Signed)
Have you been hospitalized within the last 10 days?  No Do you have a fever?  No Do you have a cough?  No Do you have a headache or sore throat? No  

## 2019-07-02 ENCOUNTER — Telehealth: Payer: Self-pay | Admitting: Emergency Medicine

## 2019-07-02 NOTE — Telephone Encounter (Signed)
Spoke with Jose Ross and she stated that she needed an OV for pt for his insurance. I advised her that he had an OV on 07/14/2019 with Dr. Lamonte Sakai and that we could fax her the OV at that time. She agreed and nothing further is needed.

## 2019-07-12 NOTE — Telephone Encounter (Signed)
Forms have been given to RB to complete.

## 2019-07-13 NOTE — Telephone Encounter (Signed)
Forms have been signed and returned to [place].

## 2019-07-13 NOTE — Telephone Encounter (Signed)
Rec'd completed forms - fwd to [place] via interoffice mail -pr  °

## 2019-07-14 ENCOUNTER — Ambulatory Visit: Payer: 59

## 2019-07-14 ENCOUNTER — Encounter: Payer: Self-pay | Admitting: Emergency Medicine

## 2019-07-14 ENCOUNTER — Ambulatory Visit: Payer: 59 | Admitting: Emergency Medicine

## 2019-07-14 ENCOUNTER — Other Ambulatory Visit: Payer: Self-pay

## 2019-07-14 VITALS — BP 146/96 | HR 78 | Temp 98.2°F | Ht 70.0 in | Wt 285.2 lb

## 2019-07-14 DIAGNOSIS — E273 Drug-induced adrenocortical insufficiency: Secondary | ICD-10-CM

## 2019-07-14 DIAGNOSIS — I2729 Other secondary pulmonary hypertension: Secondary | ICD-10-CM

## 2019-07-14 DIAGNOSIS — J301 Allergic rhinitis due to pollen: Secondary | ICD-10-CM | POA: Diagnosis not present

## 2019-07-14 DIAGNOSIS — J4550 Severe persistent asthma, uncomplicated: Secondary | ICD-10-CM

## 2019-07-14 DIAGNOSIS — K219 Gastro-esophageal reflux disease without esophagitis: Secondary | ICD-10-CM | POA: Diagnosis not present

## 2019-07-14 DIAGNOSIS — T380X5A Adverse effect of glucocorticoids and synthetic analogues, initial encounter: Secondary | ICD-10-CM

## 2019-07-14 DIAGNOSIS — G4733 Obstructive sleep apnea (adult) (pediatric): Secondary | ICD-10-CM

## 2019-07-14 MED ORDER — DUPILUMAB 300 MG/2ML ~~LOC~~ SOSY
300.0000 mg | PREFILLED_SYRINGE | Freq: Once | SUBCUTANEOUS | Status: AC
  Administered 2019-07-14: 17:00:00 300 mg via SUBCUTANEOUS

## 2019-07-14 NOTE — Patient Instructions (Signed)
Please continue your inhaled medications as you have been taking them. Please continue Zyrtec, Flonase, Singulair, Prilosec as you have been taking them. Continue your Dupixent treatments as scheduled. Continue your hydrocortisone at current doses.  I believe you need to discuss with Endocrinology trying to come down on this medication as able with a goal of weaning to off.  Suspect that the hydrocortisone is contributing to your weight gain, fluid retention. We will perform an echocardiogram to evaluate heart function. Please continue to use her CPAP reliably every night. Follow with Dr Lamonte Sakai in 1 month

## 2019-07-14 NOTE — Progress Notes (Signed)
Subjective:    Patient ID: Jose Ross, male    DOB: Apr 16, 1971, 48 y.o.   MRN: [id number]  Asthma He complains of cough, shortness of breath and wheezing. Pertinent negatives include no ear pain, fever, headaches, postnasal drip, rhinorrhea, sneezing, sore throat or trouble swallowing. His past medical history is significant for asthma.  Shortness of Breath Associated symptoms include leg swelling and wheezing. Pertinent negatives include no ear pain, fever, headaches, rash, rhinorrhea, sore throat or vomiting. His past medical history is significant for asthma.    ROV 06/14/2019 --follow-up visit for Jose Ross, 48 year old gentleman with moderate persistent asthma, hypereosinophilia, allergic rhinitis all of which have been difficult to treat.  He was prednisone dependent for months but finally we were able to begin tapering after he started Dupixent.  When we attempted to get his prednisone off completely he experienced significant fatigue, low energy concerning for possible adrenal insufficiency.  Currently on hydrocortisone 15/10 mg, remains on Trelegy, Zyrtec, Flonase, Singulair, Prilosec.  He underwent a home sleep study on 05/05/2019 which I reviewed, shows an AHI of 27/h - started on CPAP ([employer]) a couple weeks ago.  He has felt intermittent chest tightness for last couple months, has had some nocturnal awakenings. Hearing some wheeze.  He is not back to work yet. Energy level is a bit better but not normal.   ROV 07/14/2019 --Jose Ross follows up today for his moderate persistent asthma, hyper eosinophilic phenotype.  Has been quite difficult to treat and was prednisone dependent until he started Dupixent from which he has significantly benefited.  Unfortunately when we withdrew his prednisone he showed signs of adrenal insufficiency.  He is currently on hydrocortisone 50 mg a.m., 10 mg p.m. with a goal of coming off steroids altogether. His dyspnea is stable - still has some acute dyspnea  2x a day and needs his albuterol.   He has newly diagnosed obstructive sleep apnea and has started CPAP therapy >> reports that he doesn't really like it but that he is complying with it well.   At his last visit he was continued to have dyspnea and in the setting of lower extremity edema I was concerned that he may have some volume overload.  I treated him with furosemide for 4 days to see if he will get benefit. He continues to have LE edema. He is now experiencing some blurred vision as well.     Review of Systems  Constitutional: Negative for fever and unexpected weight change.  HENT: Positive for congestion. Negative for dental problem, ear pain, nosebleeds, postnasal drip, rhinorrhea, sinus pressure, sneezing, sore throat and trouble swallowing.   Eyes: Negative for redness and itching.  Respiratory: Positive for cough, chest tightness, shortness of breath and wheezing.   Cardiovascular: Positive for leg swelling. Negative for palpitations.  Gastrointestinal: Negative for nausea and vomiting.  Genitourinary: Negative for dysuria.  Musculoskeletal: Negative for joint swelling.  Skin: Negative for rash.  Allergic/Immunologic: Positive for environmental allergies. Negative for food allergies and immunocompromised state.  Neurological: Negative for headaches.  Hematological: Does not bruise/bleed easily.  Psychiatric/Behavioral: Negative for dysphoric mood. The patient is not nervous/anxious.         Objective:   Physical Exam Vitals:   07/14/19 1610  BP: (!) 146/96  Pulse: 78  Temp: 98.2 F (36.8 C)  TempSrc: Temporal  SpO2: 97%  Weight: 285 lb 3.2 oz (129.4 kg)  Height: 5\' 10"  (1.778 m)   Gen: Pleasant, well-nourished, comfortable respiratory pattern, normal affect, cushingoid  facies  ENT: No lesions,  mouth clear,  oropharynx clear but crowded, mild UA noise  Neck: No JVD, no stridor with a normal breath, no wheeze with normal breath or on a forced exp   Lungs: No  use of accessory muscles, clear to auscultation  Cardiovascular: RRR, heart sounds normal, no murmur or gallops, 1+ pretibial peripheral edema  Musculoskeletal: No deformities, no cyanosis or clubbing  Neuro: alert, non focal  Skin: Warm, no lesions or rash     Assessment & Plan:  Asthma Overall stable.  He does still need albuterol several times a day.  He has had a remarkable improvement since initiation of Dupixent.  It seems that many of his symptoms are now relate to fluid retention, weight gain.  He remains on hydrocortisone and suspected this is a contributor.  He is planning to see endocrinology again this month and I think her goal should be to try to aggressively wean with a goal to come off.   Please continue your inhaled medications as you have been taking them. Continue scheduled Dupixent as ordered Follow with Dr Lamonte Sakai in 1 month  Allergic rhinitis Please continue Zyrtec, Flonase, Singulair as you have been taking them.  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  Most notably fluid retention, edema, weight gain.  I think our goal should be to wean off as quickly as feasible.  He follows up with endocrinology in January  Obstructive sleep apnea Documented 100% compliance, greater than 4 hours each night.  He is clinically benefiting.  Some difficulty adjusting to the therapy but he is making an effort to comply.  Recommended to him that if he wakes up in the middle night and needs to go back on the CPAP that he should restart the cycle so that it will start with a lower pressure and then ramp up.  This may be more tolerable for him.  Baltazar Apo, MD, PhD 07/14/2019, 5:25 PM [facility] [phone] or if no answer [phone]

## 2019-07-14 NOTE — Assessment & Plan Note (Signed)
He is having significant side effects, potential complications from the hydrocortisone.  Most notably fluid retention, edema, weight gain.  I think our goal should be to wean off as quickly as feasible.  He follows up with endocrinology in January

## 2019-07-14 NOTE — Assessment & Plan Note (Signed)
Overall stable.  He does still need albuterol several times a day.  He has had a remarkable improvement since initiation of Dupixent.  It seems that many of his symptoms are now relate to fluid retention, weight gain.  He remains on hydrocortisone and suspected this is a contributor.  He is planning to see endocrinology again this month and I think her goal should be to try to aggressively wean with a goal to come off.   Please continue your inhaled medications as you have been taking them. Continue scheduled Dupixent as ordered Follow with Dr Lamonte Sakai in 1 month

## 2019-07-14 NOTE — Progress Notes (Signed)
Have you been hospitalized within the last 10 days?  No Do you have a fever?  No Do you have a cough?  No Do you have a headache or sore throat? No  

## 2019-07-14 NOTE — Assessment & Plan Note (Signed)
PPI as ordered 

## 2019-07-14 NOTE — Assessment & Plan Note (Signed)
Documented 100% compliance, greater than 4 hours each night.  He is clinically benefiting.  Some difficulty adjusting to the therapy but he is making an effort to comply.  Recommended to him that if he wakes up in the middle night and needs to go back on the CPAP that he should restart the cycle so that it will start with a lower pressure and then ramp up.  This may be more tolerable for him.

## 2019-07-14 NOTE — Assessment & Plan Note (Signed)
Please continue Zyrtec, Flonase, Singulair as you have been taking them.

## 2019-07-15 ENCOUNTER — Telehealth: Payer: Self-pay | Admitting: Emergency Medicine

## 2019-07-19 ENCOUNTER — Ambulatory Visit (HOSPITAL_COMMUNITY): Payer: 59

## 2019-07-19 NOTE — Telephone Encounter (Signed)
Checked appt desk and pt has already been rescheduled to tomorrow.  Nothing further needed.

## 2019-07-19 NOTE — Telephone Encounter (Signed)
Saw that pt was scheduled for echo today 07/19/19 at 9:30 at [facility]. It looks like pt did not go to that appt as from this encounter pt was needing to reschedule the echo.

## 2019-07-20 ENCOUNTER — Ambulatory Visit (HOSPITAL_COMMUNITY)
Admission: RE | Admit: 2019-07-20 | Discharge: 2019-07-20 | Disposition: A | Discharge status: Home / Self Care | Payer: 59 | Source: Ambulatory Visit | Attending: Emergency Medicine | Admitting: Emergency Medicine

## 2019-07-20 ENCOUNTER — Telehealth: Payer: Self-pay | Admitting: Emergency Medicine

## 2019-07-20 ENCOUNTER — Other Ambulatory Visit: Payer: Self-pay

## 2019-07-20 DIAGNOSIS — I2729 Other secondary pulmonary hypertension: Secondary | ICD-10-CM | POA: Insufficient documentation

## 2019-07-20 NOTE — Progress Notes (Signed)
*  PRELIMINARY RESULTS* Echocardiogram 2D Echocardiogram has been performed.  Jose Ross 07/20/2019, 9:54 AM

## 2019-07-20 NOTE — Telephone Encounter (Signed)
Dupixent Order: 300mg  #2 prefilled syringe Ordered Date: 07/20/2019 Expected date of arrival: 07/23/2019 Ordered by: 09/20/2019, CMA  [facility]

## 2019-07-26 NOTE — Telephone Encounter (Signed)
Called and spoke with [facility].  Patient Dupixent not received as scheduled.  Per Rep.  Patient consent is needed for shipment. Patient next injection scheduled 08/11/2019. ATC Patient. LM for Patient to contact specialty pharmacy for consent to ship. Will follow up.

## 2019-07-27 NOTE — Telephone Encounter (Signed)
Dupixent Order: 300mg #2 prefilled syringe Ordered Date: 07/27/19 Expected date of arrival: 07/29/19 Ordered by: Concepcion Gillott,LPN Specialty Pharmacy: [facility] 

## 2019-07-28 ENCOUNTER — Ambulatory Visit: Payer: 59

## 2019-07-29 ENCOUNTER — Ambulatory Visit (INDEPENDENT_AMBULATORY_CARE_PROVIDER_SITE_OTHER): Payer: 59

## 2019-07-29 ENCOUNTER — Other Ambulatory Visit: Payer: Self-pay

## 2019-07-29 DIAGNOSIS — J4550 Severe persistent asthma, uncomplicated: Secondary | ICD-10-CM

## 2019-07-29 MED ORDER — DUPILUMAB 300 MG/2ML ~~LOC~~ SOSY
300.0000 mg | PREFILLED_SYRINGE | Freq: Once | SUBCUTANEOUS | Status: AC
  Administered 2019-07-29: 300 mg via SUBCUTANEOUS

## 2019-07-29 NOTE — Progress Notes (Signed)
Have you been hospitalized within the last 10 days?  No Do you have a fever?  No Do you have a cough?  No Do you have a headache or sore throat? No  

## 2019-07-29 NOTE — Telephone Encounter (Signed)
Dupixent Shipment Received: 300mg  #2 prefilled syringe Medication arrival date: 07/29/19 Lot #: [id number] Exp date: 11/12/2021 Received by: 01/12/2022

## 2019-08-05 ENCOUNTER — Ambulatory Visit: Payer: 59 | Admitting: "Endocrinology

## 2019-08-05 NOTE — Telephone Encounter (Signed)
We have received the signed forms back I will put in the mail today to [employer]

## 2019-08-11 ENCOUNTER — Ambulatory Visit: Payer: 59

## 2019-08-11 ENCOUNTER — Ambulatory Visit: Payer: 59 | Admitting: Emergency Medicine

## 2019-08-11 ENCOUNTER — Encounter: Payer: Self-pay | Admitting: Emergency Medicine

## 2019-08-11 ENCOUNTER — Other Ambulatory Visit: Payer: Self-pay

## 2019-08-11 VITALS — BP 130/82 | HR 89 | Temp 97.1°F | Ht 70.0 in | Wt 280.0 lb

## 2019-08-11 DIAGNOSIS — E273 Drug-induced adrenocortical insufficiency: Secondary | ICD-10-CM

## 2019-08-11 DIAGNOSIS — J4550 Severe persistent asthma, uncomplicated: Secondary | ICD-10-CM | POA: Diagnosis not present

## 2019-08-11 DIAGNOSIS — D7219 Other eosinophilia: Secondary | ICD-10-CM

## 2019-08-11 DIAGNOSIS — T380X5A Adverse effect of glucocorticoids and synthetic analogues, initial encounter: Secondary | ICD-10-CM

## 2019-08-11 DIAGNOSIS — J301 Allergic rhinitis due to pollen: Secondary | ICD-10-CM

## 2019-08-11 DIAGNOSIS — K219 Gastro-esophageal reflux disease without esophagitis: Secondary | ICD-10-CM

## 2019-08-11 MED ORDER — PREDNISONE 5 MG PO TABS: ORAL_TABLET | ORAL | 2 refills | Status: DC

## 2019-08-11 NOTE — Patient Instructions (Addendum)
We will continue your prednisone, change to 5mg  in the am, 2.5mg  in the evening.  Please continue your Trelegy as you have been taking it.  Keep albuterol available to use 2 puffs up to every 4 hours if needed for shortness of breath, chest tightness, wheezing.  We will continue your Dupixent injections as scheduled.  Continue singulair, flonase, zyrtec as you have been taking it.  Follow with Dr in 1 month to assess your status.

## 2019-08-11 NOTE — Assessment & Plan Note (Signed)
Moderate persistent asthma with a significant improvement since he started Dupixent.  He is still symptomatic, uses albuterol daily but his steroid needs have decreased significantly.  I like to get him on the lowest possible dose taking into account that he is also showing signs of adrenal insufficiency.  I will try decreasing to 5 mg in the morning, 2.5 mg prednisone in the evening.  Continue his Trelegy, albuterol as needed.  Continue the Dupixent as ordered.

## 2019-08-11 NOTE — Assessment & Plan Note (Signed)
Continue PPI as ordered 

## 2019-08-11 NOTE — Assessment & Plan Note (Signed)
Continue Singulair, Flonase, Zyrtec

## 2019-08-11 NOTE — Assessment & Plan Note (Signed)
Off hydrocortisone, on low-dose prednisone with plans to adjust as above.  Of note his lower extremity edema improved significantly when he got off the Hydrocort and changed to prednisone.  He will follow up with endocrinology when possible.  I will order the requested previsit labs for him to be done here in preparation for that.

## 2019-08-11 NOTE — Progress Notes (Signed)
Subjective:    Patient ID: Jose Ross, male    DOB: 1970-12-18, 49 y.o.   MRN: [id number]  Asthma He complains of cough, shortness of breath and wheezing. Pertinent negatives include no ear pain, fever, headaches, postnasal drip, rhinorrhea, sneezing, sore throat or trouble swallowing. His past medical history is significant for asthma.  Shortness of Breath Associated symptoms include leg swelling and wheezing. Pertinent negatives include no ear pain, fever, headaches, rash, rhinorrhea, sore throat or vomiting. His past medical history is significant for asthma.    ROV 07/14/2019 --Jose Ross follows up today for his moderate persistent asthma, hyper eosinophilic phenotype.  Has been quite difficult to treat and was prednisone dependent until he started Dupixent from which he has significantly benefited.  Unfortunately when we withdrew his prednisone he showed signs of adrenal insufficiency.  He is currently on hydrocortisone 50 mg a.m., 10 mg p.m. with a goal of coming off steroids altogether. His dyspnea is stable - still has some acute dyspnea 2x a day and needs his albuterol.   He has newly diagnosed obstructive sleep apnea and has started CPAP therapy >> reports that he doesn't really like it but that he is complying with it well.   At his last visit he was continued to have dyspnea and in the setting of lower extremity edema I was concerned that he may have some volume overload.  I treated him with furosemide for 4 days to see if he will get benefit. He continues to have LE edema. He is now experiencing some blurred vision as well.   ROV 08/11/2019 --follow-up visit today for 49 year old gentleman with moderate persistent asthma and hypereosinophilic phenotype.  Has benefited significantly from starting Jose Ross.  We have been able to wean his prednisone but he had adrenal insufficiency.  He is now on prednisone 5 mg twice daily because he was unable to get hydrocortisone from endocrinology,  difficulty with arranging that appointment.  Since making the transition of hydrocortisone to prednisone his lower extremity edema has actually improved.  His other regimen includes Singulair, Flonase, Zyrtec, . He reports that his breathing is stable, can feel a weight on his chest. Has a lot less edema. Activity level is a bit improved. He is interested in increasing his activity to lose some weight.     Review of Systems  Constitutional: Negative for fever and unexpected weight change.  HENT: Positive for congestion. Negative for dental problem, ear pain, nosebleeds, postnasal drip, rhinorrhea, sinus pressure, sneezing, sore throat and trouble swallowing.   Eyes: Negative for redness and itching.  Respiratory: Positive for cough, chest tightness, shortness of breath and wheezing.   Cardiovascular: Positive for leg swelling. Negative for palpitations.  Gastrointestinal: Negative for nausea and vomiting.  Genitourinary: Negative for dysuria.  Musculoskeletal: Negative for joint swelling.  Skin: Negative for rash.  Allergic/Immunologic: Positive for environmental allergies. Negative for food allergies and immunocompromised state.  Neurological: Negative for headaches.  Hematological: Does not bruise/bleed easily.  Psychiatric/Behavioral: Negative for dysphoric mood. The patient is not nervous/anxious.         Objective:   Physical Exam Vitals:   08/11/19 1339  BP: 130/82  Pulse: 89  Temp: (!) 97.1 F (36.2 C)  TempSrc: Temporal  SpO2: 98%  Weight: 280 lb (127 kg)  Height: 5\' 10"  (1.778 m)   Gen: Pleasant, well-nourished, comfortable respiratory pattern, normal affect, cushingoid facies  ENT: No lesions,  mouth clear,  oropharynx clear but crowded, no UA noise  Neck: No JVD,  no stridor with a normal breath, no wheeze with normal breath or on a forced exp   Lungs: No use of accessory muscles, clear to auscultation  Cardiovascular: RRR, heart sounds normal, no murmur or  gallops, 1+ pretibial peripheral edema > improved  Musculoskeletal: No deformities, no cyanosis or clubbing  Neuro: alert, non focal  Skin: Warm, no lesions or rash     Assessment & Plan:  Asthma Moderate persistent asthma with a significant improvement since he started Dupixent.  He is still symptomatic, uses albuterol daily but his steroid needs have decreased significantly.  I like to get him on the lowest possible dose taking into account that he is also showing signs of adrenal insufficiency.  I will try decreasing to 5 mg in the morning, 2.5 mg prednisone in the evening.  Continue his Trelegy, albuterol as needed.  Continue the Dupixent as ordered.  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  Of note his lower extremity edema improved significantly when he got off the Hydrocort and changed to prednisone.  He will follow up with endocrinology when possible.  I will order the requested previsit labs for him to be done here in preparation for that.  Eosinophilia He has been following with hematology.  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  Unclear that there is any other intervention to make at this time other than surveillance.  Jose Pupa, MD, PhD 08/11/2019, 3:17 PM [facility] [phone] or if no answer [phone]

## 2019-08-11 NOTE — Assessment & Plan Note (Signed)
He has been following with hematology.  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  Unclear that there is any other intervention to make at this time other than surveillance.

## 2019-08-12 ENCOUNTER — Ambulatory Visit (INDEPENDENT_AMBULATORY_CARE_PROVIDER_SITE_OTHER): Payer: 59

## 2019-08-12 DIAGNOSIS — J4550 Severe persistent asthma, uncomplicated: Secondary | ICD-10-CM | POA: Diagnosis not present

## 2019-08-12 DIAGNOSIS — E273 Drug-induced adrenocortical insufficiency: Secondary | ICD-10-CM | POA: Diagnosis not present

## 2019-08-12 DIAGNOSIS — T380X5A Adverse effect of glucocorticoids and synthetic analogues, initial encounter: Secondary | ICD-10-CM

## 2019-08-12 LAB — COMPREHENSIVE METABOLIC PANEL
ALT: 34 U/L (ref 0–53)
AST: 25 U/L (ref 0–37)
Albumin: 4.1 g/dL (ref 3.5–5.2)
Alkaline Phosphatase: 111 U/L (ref 39–117)
BUN: 15 mg/dL (ref 6–23)
CO2: 29 mEq/L (ref 19–32)
Calcium: 9.1 mg/dL (ref 8.4–10.5)
Chloride: 100 mEq/L (ref 96–112)
Creatinine, Ser: 1.24 mg/dL (ref 0.40–1.50)
GFR: 62.07 mL/min (ref >60.00)
Glucose, Bld: 84 mg/dL (ref 70–99)
Potassium: 4.1 mEq/L (ref 3.5–5.1)
Sodium: 139 mEq/L (ref 135–145)
Total Bilirubin: 0.7 mg/dL (ref 0.2–1.2)
Total Protein: 6.2 g/dL (ref 6.0–8.3)

## 2019-08-12 LAB — LIPID PANEL
Cholesterol: 177 mg/dL (ref 0–200)
HDL: 53.2 mg/dL (ref >39.00)
LDL Cholesterol: 95 mg/dL (ref 0–99)
NonHDL: 124.15
Total CHOL/HDL Ratio: 3
Triglycerides: 146 mg/dL (ref 0.0–149.0)
VLDL: 29.2 mg/dL (ref 0.0–40.0)

## 2019-08-12 LAB — TSH: TSH: 1.36 u[IU]/mL (ref 0.35–4.50)

## 2019-08-12 LAB — T4, FREE: Free T4: 0.95 ng/dL (ref 0.60–1.60)

## 2019-08-12 MED ORDER — DUPILUMAB 300 MG/2ML ~~LOC~~ SOSY
300.0000 mg | PREFILLED_SYRINGE | Freq: Once | SUBCUTANEOUS | Status: AC
  Administered 2019-08-12: 300 mg via SUBCUTANEOUS

## 2019-08-12 NOTE — Progress Notes (Signed)
All questions were answered by the patient before medication was administered. Have you been hospitalized in the last 10 days? No Do you have a fever? No Do you have a cough? No Do you have a headache or sore throat? No  

## 2019-08-13 LAB — VITAMIN D 25 HYDROXY (VIT D DEFICIENCY, FRACTURES): Vit D, 25-Hydroxy: 8 ng/mL — ABNORMAL LOW (ref 30–100)

## 2019-08-16 ENCOUNTER — Telehealth: Payer: Self-pay | Admitting: Emergency Medicine

## 2019-08-16 NOTE — Telephone Encounter (Signed)
Called [facility] to set up Dupixent delivery. Patient co pay assistance card is not working.   Will follow up with [employer] about co pay assistance.

## 2019-08-18 NOTE — Telephone Encounter (Signed)
Dupixent Order: 300mg  #2 prefilled syringe Ordered Date: 08/18/19 Expected date of arrival: 08/19/19 Ordered by: Trafton Roker,LPN Specialty Pharmacy: [facility]

## 2019-08-19 NOTE — Telephone Encounter (Signed)
Dupixent Shipment Received: 300mg  #2 prefilled syringe Medication arrival date: 08/19/19 Lot #: [id number] Exp date: 11/12/2021 Received by: 01/12/2022

## 2019-08-24 ENCOUNTER — Telehealth: Payer: Self-pay | Admitting: Emergency Medicine

## 2019-08-25 NOTE — Telephone Encounter (Signed)
Forms were completed by RB on 08/24/2019.

## 2019-08-25 NOTE — Telephone Encounter (Signed)
Forms being sent over to [employer]

## 2019-08-26 ENCOUNTER — Other Ambulatory Visit: Payer: Self-pay

## 2019-08-26 ENCOUNTER — Ambulatory Visit (INDEPENDENT_AMBULATORY_CARE_PROVIDER_SITE_OTHER): Payer: 59

## 2019-08-26 DIAGNOSIS — J4550 Severe persistent asthma, uncomplicated: Secondary | ICD-10-CM | POA: Diagnosis not present

## 2019-08-26 MED ORDER — DUPILUMAB 300 MG/2ML ~~LOC~~ SOSY
300.0000 mg | PREFILLED_SYRINGE | Freq: Once | SUBCUTANEOUS | Status: AC
  Administered 2019-08-26: 300 mg via SUBCUTANEOUS

## 2019-08-26 NOTE — Progress Notes (Signed)
Have you been hospitalized within the last 10 days?  No Do you have a fever?  No Do you have a cough?  No Do you have a headache or sore throat? No  

## 2019-09-09 ENCOUNTER — Other Ambulatory Visit: Payer: Self-pay

## 2019-09-09 ENCOUNTER — Ambulatory Visit (INDEPENDENT_AMBULATORY_CARE_PROVIDER_SITE_OTHER): Payer: 59

## 2019-09-09 DIAGNOSIS — J4550 Severe persistent asthma, uncomplicated: Secondary | ICD-10-CM

## 2019-09-09 MED ORDER — DUPILUMAB 300 MG/2ML ~~LOC~~ SOSY
300.0000 mg | PREFILLED_SYRINGE | Freq: Once | SUBCUTANEOUS | Status: AC
  Administered 2019-09-09: 300 mg via SUBCUTANEOUS

## 2019-09-09 NOTE — Progress Notes (Signed)
All questions were answered by the patient before medication was administered. Have you been hospitalized in the last 10 days? No Do you have a fever? No Do you have a cough? No Do you have a headache or sore throat? No  

## 2019-09-13 ENCOUNTER — Encounter: Payer: Self-pay | Admitting: Emergency Medicine

## 2019-09-13 ENCOUNTER — Ambulatory Visit: Payer: 59 | Admitting: Emergency Medicine

## 2019-09-13 ENCOUNTER — Telehealth: Payer: Self-pay | Admitting: Emergency Medicine

## 2019-09-13 ENCOUNTER — Other Ambulatory Visit: Payer: Self-pay

## 2019-09-13 DIAGNOSIS — J4550 Severe persistent asthma, uncomplicated: Secondary | ICD-10-CM

## 2019-09-13 DIAGNOSIS — D7219 Other eosinophilia: Secondary | ICD-10-CM | POA: Diagnosis not present

## 2019-09-13 DIAGNOSIS — J301 Allergic rhinitis due to pollen: Secondary | ICD-10-CM | POA: Diagnosis not present

## 2019-09-13 NOTE — Assessment & Plan Note (Signed)
Plan to follow with hematology this month, repeat blood work to assess eosinophilic profile.

## 2019-09-13 NOTE — Assessment & Plan Note (Signed)
Continue your same allergy medications as you have been taking them

## 2019-09-13 NOTE — Telephone Encounter (Signed)
Dupixent Order: 300mg #2 prefilled syringe Ordered Date: 09/13/2019 Expected date of arrival: 09/16/2019 Ordered by: Alwilda Gilland, CMA  [facility]  

## 2019-09-13 NOTE — Patient Instructions (Addendum)
We will try decreasing your prednisone to 2.5mg  twice a day.  Continue your Dupixent on current schedule.  Continue your same allergy medications as you have been taking them  Follow with endocrinology as planned.  We will set a goal to get back to work beginning April 1. We will discuss next visit.  Continue your Trelegy daily  Wear your CPAP reliably every night.  Follow with Dr Delton Coombes in 1 month

## 2019-09-13 NOTE — Progress Notes (Signed)
Subjective:    Patient ID: Jose Ross, male    DOB: 1971/05/27, 49 y.o.   MRN: [id number]  Asthma He complains of cough. There is no shortness of breath or wheezing. Pertinent negatives include no ear pain, fever, headaches, postnasal drip, rhinorrhea, sneezing, sore throat or trouble swallowing. His past medical history is significant for asthma.  Shortness of Breath Pertinent negatives include no ear pain, fever, headaches, leg swelling, rash, rhinorrhea, sore throat, vomiting or wheezing. His past medical history is significant for asthma.     ROV 08/11/2019 --follow-up visit today for 49 year old gentleman with moderate persistent asthma and hypereosinophilic phenotype.  Has benefited significantly from starting [place].  We have been able to wean his prednisone but he had adrenal insufficiency.  He is now on prednisone 5 mg twice daily because he was unable to get hydrocortisone from endocrinology, difficulty with arranging that appointment.  Since making the transition of hydrocortisone to prednisone his lower extremity edema has actually improved.  His other regimen includes Singulair, Flonase, Zyrtec, . He reports that his breathing is stable, can feel a weight on his chest. Has a lot less edema. Activity level is a bit improved. He is interested in increasing his activity to lose some weight.   ROV 09/13/2019 --49 year old man with moderate persistent asthma, hypereosinophilic phenotype and allergic rhinitis. He is on Dupixent and has benefited significantly.  Has also been on chronic steroids for this as well as contribution of adrenal insufficiency.  At his last visit we tried decreasing to 5 mg in the morning, 2.5 mg in the evening.  He reports that he is tolerating the decrease in pred to 5mg / 2.5mg . he has done well since making the change - has not backtracked in any way.  Still with nocturnal awakenings for dyspnea, frequent albuterol use.  He wants to go back to work in April if  possible.  No flares   Review of Systems  Constitutional: Negative for fever and unexpected weight change.  HENT: Positive for congestion. Negative for dental problem, ear pain, nosebleeds, postnasal drip, rhinorrhea, sinus pressure, sneezing, sore throat and trouble swallowing.   Eyes: Negative for redness and itching.  Respiratory: Positive for cough and chest tightness. Negative for shortness of breath and wheezing.   Cardiovascular: Negative for palpitations and leg swelling.  Gastrointestinal: Negative for nausea and vomiting.  Genitourinary: Negative for dysuria.  Musculoskeletal: Negative for joint swelling.  Skin: Negative for rash.  Allergic/Immunologic: Positive for environmental allergies. Negative for food allergies and immunocompromised state.  Neurological: Negative for headaches.  Hematological: Does not bruise/bleed easily.  Psychiatric/Behavioral: Negative for dysphoric mood. The patient is not nervous/anxious.         Objective:   Physical Exam Vitals:   09/13/19 1328  BP: (!) 144/92  Pulse: (!) 104  Temp: (!) 97.3 F (36.3 C)  TempSrc: Temporal  SpO2: 98%  Weight: 277 lb 6.4 oz (125.8 kg)  Height: 5\' 10"  (1.778 m)   Gen: Pleasant, well-nourished, comfortable respiratory pattern, normal affect, cushingoid facies  ENT: No lesions,  mouth clear,  oropharynx clear but crowded, no UA noise  Neck: No JVD, no stridor with a normal breath, no wheeze with normal breath or on a forced exp   Lungs: No use of accessory muscles, clear to auscultation  Cardiovascular: RRR, heart sounds normal, no murmur or gallops, 1+ pretibial peripheral edema > improved  Musculoskeletal: No deformities, no cyanosis or clubbing  Neuro: alert, non focal  Skin: Warm, no lesions or  rash     Assessment & Plan:  Asthma We will try decreasing your prednisone to 2.5mg  twice a day.  Continue your Dupixent on current schedule.  Follow with endocrinology as planned.  We will set a  goal to get back to work beginning April 1. We will discuss next visit.  Continue your Trelegy daily  Wear your CPAP reliably every night.  Follow with Dr Delton Coombes in 1 month  Allergic rhinitis Continue your same allergy medications as you have been taking them   Eosinophilia Plan to follow with hematology this month, repeat blood work to assess eosinophilic profile.  Levy Pupa, MD, PhD 09/13/2019, 1:54 PM [facility] [phone] or if no answer [phone]

## 2019-09-13 NOTE — Assessment & Plan Note (Signed)
We will try decreasing your prednisone to 2.5mg  twice a day.  Continue your Dupixent on current schedule.  Follow with endocrinology as planned.  We will set a goal to get back to work beginning April 1. We will discuss next visit.  Continue your Trelegy daily  Wear your CPAP reliably every night.  Follow with Dr Delton Coombes in 1 month

## 2019-09-15 ENCOUNTER — Other Ambulatory Visit (HOSPITAL_COMMUNITY): Payer: Self-pay | Admitting: *Deleted

## 2019-09-15 DIAGNOSIS — D500 Iron deficiency anemia secondary to blood loss (chronic): Secondary | ICD-10-CM

## 2019-09-15 DIAGNOSIS — D649 Anemia, unspecified: Secondary | ICD-10-CM

## 2019-09-15 DIAGNOSIS — D696 Thrombocytopenia, unspecified: Secondary | ICD-10-CM

## 2019-09-16 ENCOUNTER — Other Ambulatory Visit: Payer: Self-pay

## 2019-09-16 ENCOUNTER — Inpatient Hospital Stay (HOSPITAL_COMMUNITY): Payer: 59 | Attending: Hematology

## 2019-09-16 DIAGNOSIS — D649 Anemia, unspecified: Secondary | ICD-10-CM

## 2019-09-16 DIAGNOSIS — J4540 Moderate persistent asthma, uncomplicated: Secondary | ICD-10-CM | POA: Diagnosis not present

## 2019-09-16 DIAGNOSIS — F17210 Nicotine dependence, cigarettes, uncomplicated: Secondary | ICD-10-CM | POA: Diagnosis not present

## 2019-09-16 DIAGNOSIS — D721 Eosinophilia, unspecified: Secondary | ICD-10-CM | POA: Diagnosis not present

## 2019-09-16 DIAGNOSIS — Z7952 Long term (current) use of systemic steroids: Secondary | ICD-10-CM | POA: Diagnosis not present

## 2019-09-16 DIAGNOSIS — Z79899 Other long term (current) drug therapy: Secondary | ICD-10-CM | POA: Diagnosis not present

## 2019-09-16 DIAGNOSIS — D500 Iron deficiency anemia secondary to blood loss (chronic): Secondary | ICD-10-CM

## 2019-09-16 DIAGNOSIS — D696 Thrombocytopenia, unspecified: Secondary | ICD-10-CM

## 2019-09-16 LAB — CBC WITH DIFFERENTIAL/PLATELET
Abs Immature Granulocytes: 0.05 10*3/uL (ref 0.00–0.07)
Basophils Absolute: 0.1 10*3/uL (ref 0.0–0.1)
Basophils Relative: 1 %
Eosinophils Absolute: 0.3 10*3/uL (ref 0.0–0.5)
Eosinophils Relative: 4 %
HCT: 51 % (ref 39.0–52.0)
Hemoglobin: 17 g/dL (ref 13.0–17.0)
Immature Granulocytes: 1 %
Lymphocytes Relative: 19 %
Lymphs Abs: 1.7 10*3/uL (ref 0.7–4.0)
MCH: 32 pg (ref 26.0–34.0)
MCHC: 33.3 g/dL (ref 30.0–36.0)
MCV: 96 fL (ref 80.0–100.0)
Monocytes Absolute: 0.6 10*3/uL (ref 0.1–1.0)
Monocytes Relative: 7 %
Neutro Abs: 6.5 10*3/uL (ref 1.7–7.7)
Neutrophils Relative %: 68 %
Platelets: 210 10*3/uL (ref 150–400)
RBC: 5.31 MIL/uL (ref 4.22–5.81)
RDW: 13.2 % (ref 11.5–15.5)
WBC: 9.3 10*3/uL (ref 4.0–10.5)
nRBC: 0 % (ref 0.0–0.2)

## 2019-09-16 LAB — COMPREHENSIVE METABOLIC PANEL
ALT: 38 U/L (ref 0–44)
AST: 24 U/L (ref 15–41)
Albumin: 3.8 g/dL (ref 3.5–5.0)
Alkaline Phosphatase: 102 U/L (ref 38–126)
Anion gap: 9 (ref 5–15)
BUN: 18 mg/dL (ref 6–20)
CO2: 24 mmol/L (ref 22–32)
Calcium: 9.1 mg/dL (ref 8.9–10.3)
Chloride: 106 mmol/L (ref 98–111)
Creatinine, Ser: 1.2 mg/dL (ref 0.61–1.24)
GFR calc Af Amer: >60 mL/min (ref >60)
GFR calc non Af Amer: >60 mL/min (ref >60)
Glucose, Bld: 132 mg/dL — ABNORMAL HIGH (ref 70–99)
Potassium: 3.8 mmol/L (ref 3.5–5.1)
Sodium: 139 mmol/L (ref 135–145)
Total Bilirubin: 0.7 mg/dL (ref 0.3–1.2)
Total Protein: 6.8 g/dL (ref 6.5–8.1)

## 2019-09-16 NOTE — Telephone Encounter (Signed)
Dupixent Shipment Received: 300mg #2 prefilled syringe Medication arrival date: 09/16/2019 Lot #: [id number] Exp date: 12/2021 Received by: Westen Dinino, CMA   

## 2019-09-23 ENCOUNTER — Inpatient Hospital Stay (HOSPITAL_BASED_OUTPATIENT_CLINIC_OR_DEPARTMENT_OTHER): Payer: 59 | Admitting: Nurse Practitioner

## 2019-09-23 ENCOUNTER — Ambulatory Visit: Payer: 59

## 2019-09-23 ENCOUNTER — Other Ambulatory Visit: Payer: Self-pay

## 2019-09-23 ENCOUNTER — Encounter (HOSPITAL_COMMUNITY): Payer: Self-pay | Admitting: Nurse Practitioner

## 2019-09-23 VITALS — BP 153/99 | HR 101 | Temp 98.0°F | Resp 20 | Wt 278.4 lb

## 2019-09-23 DIAGNOSIS — D500 Iron deficiency anemia secondary to blood loss (chronic): Secondary | ICD-10-CM

## 2019-09-23 DIAGNOSIS — D7219 Other eosinophilia: Secondary | ICD-10-CM | POA: Diagnosis not present

## 2019-09-23 DIAGNOSIS — D721 Eosinophilia, unspecified: Secondary | ICD-10-CM | POA: Diagnosis not present

## 2019-09-23 NOTE — Progress Notes (Signed)
Jose Ross, [place] [address]   CLINIC:  Medical Oncology/Hematology  PCP:  Patient, No Pcp Per No address on file None   REASON FOR VISIT: Follow-up for elevated eosinophils  CURRENT THERAPY: Observation   INTERVAL HISTORY:  Jose Ross 49 y.o. male returns for routine follow-up for elevated eosinophils.  Patient reports he is doing well with his injections and inhalers.  He reports pulmonary is trying to taper him off his prednisone.  His asthma usually flares up when they try to do this.  Otherwise he remains mildly short of breath especially with exertion. Denies any nausea, vomiting, or diarrhea. Denies any new pains. Had not noticed any recent bleeding such as epistaxis, hematuria or hematochezia. Denies recent chest pain on exertion, pre-syncopal episodes, or palpitations. Denies any numbness or tingling in hands or feet. Denies any recent fevers, infections, or recent hospitalizations. Patient reports appetite at 25% and energy level at 75%.  He is eating well maintain his weight at this time.    REVIEW OF SYSTEMS:  Review of Systems  Respiratory: Positive for shortness of breath.   All other systems reviewed and are negative.    PAST MEDICAL/SURGICAL HISTORY:  Past Medical History:  Diagnosis Date  . Asthma   . Eosinophilia   . Pulmonary nodules    History reviewed. No pertinent surgical history.   SOCIAL HISTORY:  Social History   Socioeconomic History  . Marital status: Single    Spouse name: Not on file  . Number of children: 2  . Years of education: Not on file  . Highest education level: Not on file  Occupational History  . Not on file  Tobacco Use  . Smoking status: Former Smoker    Packs/day: 0.25    Years: 10.00    Pack years: 2.50    Types: Cigarettes    Quit date: 2000    Years since quitting: 21.2  . Smokeless tobacco: Never Used  Substance and Sexual Activity  . Alcohol use: Yes    Comment: 4-5  drinks a week  . Drug use: Not Currently  . Sexual activity: Not on file  Other Topics Concern  . Not on file  Social History Narrative  . Not on file   Social Determinants of Health   Financial Resource Strain:   . Difficulty of Paying Living Expenses:   Food Insecurity:   . Worried About Charity fundraiser in the Last Year:   . Arboriculturist in the Last Year:   Transportation Needs:   . Film/video editor (Medical):   Marland Kitchen Lack of Transportation (Non-Medical):   Physical Activity:   . Days of Exercise per Week:   . Minutes of Exercise per Session:   Stress:   . Feeling of Stress :   Social Connections:   . Frequency of Communication with Friends and Family:   . Frequency of Social Gatherings with Friends and Family:   . Attends Religious Services:   . Active Member of Clubs or Organizations:   . Attends Archivist Meetings:   Marland Kitchen Marital Status:   Intimate Partner Violence:   . Fear of Current or Ex-Partner:   . Emotionally Abused:   Marland Kitchen Physically Abused:   . Sexually Abused:     FAMILY HISTORY:  Family History  Problem Relation Age of Onset  . Asthma Mother   . Skin cancer Mother     CURRENT MEDICATIONS:  Outpatient Encounter  Medications as of 09/23/2019  Medication Sig  . albuterol (PROVENTIL) (2.5 MG/3ML) 0.083% nebulizer solution Take 3 mLs (2.5 mg total) by nebulization every 4 (four) hours as needed for wheezing or shortness of breath.  . cetirizine (ZYRTEC) 10 MG tablet Take 10 mg by mouth daily.  . Dupilumab (DUPIXENT) 300 MG/2ML SOPN Inject 300 mg into the skin every 14 (fourteen) days.  . fluticasone (FLONASE) 50 MCG/ACT nasal spray Place 2 sprays into both nostrils daily.  . Fluticasone-Umeclidin-Vilant 100-62.5-25 MCG/INH AEPB Inhale 1 puff into the lungs daily.   . furosemide (LASIX) 20 MG tablet Take 1 tablet (20 mg total) by mouth daily.  . hydrocortisone (CORTEF) 10 MG tablet Take 1 and 1/2 tablets every morning and 1 tablet every  evening  . montelukast (SINGULAIR) 10 MG tablet Take 1 tablet (10 mg total) by mouth at bedtime.  Marland Kitchen omeprazole (PRILOSEC) 20 MG capsule Take 1 capsule (20 mg total) by mouth 2 (two) times daily before a meal. (Patient taking differently: Take 20 mg by mouth daily. )  . predniSONE (DELTASONE) 5 MG tablet 5 mg in the am and 2.5 mg in the pm (Patient taking differently: 2.5 mg in the am and 2.5 mg in the pm)  . VENTOLIN HFA 108 (90 Base) MCG/ACT inhaler INHALE TWO PUFFS EVERY 4 HOURS AS NEEDED FOR WHEEZING OR SHORTNESS OF BREATH  . EPINEPHrine 0.3 mg/0.3 mL IJ SOAJ injection Inject one dose intramuscularly for allergic reaction. May repeat one dose if needed after 5-15 minutes. Proceed to the ER   No facility-administered encounter medications on file as of 09/23/2019.    ALLERGIES:  No Known Allergies   PHYSICAL EXAM:  ECOG Performance status: 1  Vitals:   09/23/19 1400  BP: (!) 153/99  Pulse: (!) 101  Resp: 20  Temp: 98 F (36.7 C)  SpO2: 98%   Filed Weights   09/23/19 1400  Weight: 278 lb 6 oz (126.3 kg)    Physical Exam Constitutional:      Appearance: Normal appearance. He is normal weight.  Cardiovascular:     Rate and Rhythm: Normal rate and regular rhythm.     Heart sounds: Normal heart sounds.  Pulmonary:     Effort: Pulmonary effort is normal.     Breath sounds: Normal breath sounds.  Abdominal:     General: Bowel sounds are normal.     Palpations: Abdomen is soft.  Musculoskeletal:        General: Normal range of motion.  Skin:    General: Skin is warm.  Neurological:     Mental Status: He is alert and oriented to person, place, and time. Mental status is at baseline.  Psychiatric:        Mood and Affect: Mood normal.        Behavior: Behavior normal.        Thought Content: Thought content normal.        Judgment: Judgment normal.      LABORATORY DATA:  I have reviewed the labs as listed.  CBC    Component Value Date/Time   WBC 9.3 09/16/2019  1357   RBC 5.31 09/16/2019 1357   HGB 17.0 09/16/2019 1357   HCT 51.0 09/16/2019 1357   PLT 210 09/16/2019 1357   MCV 96.0 09/16/2019 1357   MCH 32.0 09/16/2019 1357   MCHC 33.3 09/16/2019 1357   RDW 13.2 09/16/2019 1357   LYMPHSABS 1.7 09/16/2019 1357   MONOABS 0.6 09/16/2019 1357   EOSABS 0.3  09/16/2019 1357   BASOSABS 0.1 09/16/2019 1357   CMP Latest Ref Rng & Units 09/16/2019 08/12/2019 03/19/2019  Glucose 70 - 99 mg/dL 132(H) 84 105(H)  BUN 6 - 20 mg/dL '18 15 13  '$ Creatinine 0.61 - 1.24 mg/dL 1.20 1.24 1.17  Sodium 135 - 145 mmol/L 139 139 140  Potassium 3.5 - 5.1 mmol/L 3.8 4.1 4.0  Chloride 98 - 111 mmol/L 106 100 106  CO2 22 - 32 mmol/L '24 29 25  '$ Calcium 8.9 - 10.3 mg/dL 9.1 9.1 8.8(L)  Total Protein 6.5 - 8.1 g/dL 6.8 6.2 6.6  Total Bilirubin 0.3 - 1.2 mg/dL 0.7 0.7 0.6  Alkaline Phos 38 - 126 U/L 102 111 111  AST 15 - 41 U/L '24 25 25  '$ ALT 0 - 44 U/L 38 34 34     I personally performed a face-to-face visit.  All questions were answered to patient's stated satisfaction. Encouraged patient to call with any new concerns or questions before his next visit to the cancer center and we can certain see him sooner, if needed.     ASSESSMENT & PLAN:   Eosinophilia 1.  [place] in [place]: -Elevated absolute eosinophil count of 5800 recorded on 12/04/2017. -Elevated IgE level on 2 occasions which were 850 and 588. -I have reviewed his high-resolution CT scan of the chest which did not reveal any pulmonary involvement.  He does not have any gastrointestinal symptoms. -FGFR1 and PDGFR beta on peripheral blood was negative. Strongyloids antibody was negative.  LDH was in the upper limits of normal. -CT scan of the abdomen on 03/20/2018 showed spleen size in the upper limits of normal. -Echocardiogram shows EF of 55 to 60% with mild thickening of the leaflets.  He had normal tryptase levels and B12 was normal. -PDGFR A and FP1L1 was negative.  JAK2 mutation was also negative. -As there was no  convincing evidence of primary eosinophilic disorder I do not recommend a bone marrow biopsy and aspiration at this time. -She is still on Dupixent every 2 weeks and prednisone 5 mg daily. -Pulmonology has prescribed these medicines and is slowly trying to taper the prednisone off.  His asthma does flareup when he comes off the prednisone. -Labs done on 09/16/2019 showed potassium 3.8, creatinine 1.20, WBC 9.3, hemoglobin 17.0, platelets 220, eosinophils 0.3. -We will continue to monitor eosinophil levels.  He will return to the clinic in 6 months with labs.  2.  Moderate persistent asthma: -He is on Dupixent injections.  He reports this is helped him the most. -He is also using prednisone 5 mg daily along with inhalers.       Orders placed this encounter:  Orders Placed This Encounter  Procedures  . IGE  . Lactate dehydrogenase  . CBC with Differential/Platelet  . Comprehensive metabolic panel      Francene Finders, FNP-C [facility] [phone]

## 2019-09-23 NOTE — Assessment & Plan Note (Signed)
1.  [place] in [place]: -Elevated absolute eosinophil count of 5800 recorded on 12/04/2017. -Elevated IgE level on 2 occasions which were 850 and 588. -I have reviewed his high-resolution CT scan of the chest which did not reveal any pulmonary involvement.  He does not have any gastrointestinal symptoms. -FGFR1 and PDGFR beta on peripheral blood was negative. Strongyloids antibody was negative.  LDH was in the upper limits of normal. -CT scan of the abdomen on 03/20/2018 showed spleen size in the upper limits of normal. -Echocardiogram shows EF of 55 to 60% with mild thickening of the leaflets.  He had normal tryptase levels and B12 was normal. -PDGFR A and FP1L1 was negative.  JAK2 mutation was also negative. -As there was no convincing evidence of primary eosinophilic disorder I do not recommend a bone marrow biopsy and aspiration at this time. -She is still on Dupixent every 2 weeks and prednisone 5 mg daily. -Pulmonology has prescribed these medicines and is slowly trying to taper the prednisone off.  His asthma does flareup when he comes off the prednisone. -Labs done on 09/16/2019 showed potassium 3.8, creatinine 1.20, WBC 9.3, hemoglobin 17.0, platelets 220, eosinophils 0.3. -We will continue to monitor eosinophil levels.  He will return to the clinic in 6 months with labs.  2.  Moderate persistent asthma: -He is on Dupixent injections.  He reports this is helped him the most. -He is also using prednisone 5 mg daily along with inhalers.

## 2019-09-27 ENCOUNTER — Ambulatory Visit (INDEPENDENT_AMBULATORY_CARE_PROVIDER_SITE_OTHER): Payer: 59

## 2019-09-27 ENCOUNTER — Other Ambulatory Visit: Payer: Self-pay

## 2019-09-27 DIAGNOSIS — J4550 Severe persistent asthma, uncomplicated: Secondary | ICD-10-CM | POA: Diagnosis not present

## 2019-09-27 MED ORDER — DUPILUMAB 300 MG/2ML ~~LOC~~ SOSY
300.0000 mg | PREFILLED_SYRINGE | Freq: Once | SUBCUTANEOUS | Status: AC
  Administered 2019-09-27: 300 mg via SUBCUTANEOUS

## 2019-09-27 NOTE — Progress Notes (Signed)
All questions were answered by the patient before medication was administered. Have you been hospitalized in the last 10 days? No Do you have a fever? No Do you have a cough? No Do you have a headache or sore throat? No  

## 2019-09-29 ENCOUNTER — Telehealth: Payer: Self-pay | Admitting: Emergency Medicine

## 2019-09-30 NOTE — Telephone Encounter (Signed)
Forms signed and sent back to [facility]

## 2019-10-11 ENCOUNTER — Ambulatory Visit (INDEPENDENT_AMBULATORY_CARE_PROVIDER_SITE_OTHER): Payer: 59

## 2019-10-11 ENCOUNTER — Other Ambulatory Visit: Payer: Self-pay

## 2019-10-11 DIAGNOSIS — J4550 Severe persistent asthma, uncomplicated: Secondary | ICD-10-CM | POA: Diagnosis not present

## 2019-10-11 MED ORDER — DUPILUMAB 300 MG/2ML ~~LOC~~ SOSY
300.0000 mg | PREFILLED_SYRINGE | Freq: Once | SUBCUTANEOUS | Status: AC
  Administered 2019-10-11: 300 mg via SUBCUTANEOUS

## 2019-10-11 NOTE — Progress Notes (Signed)
All questions were answered by the patient before medication was administered. Have you been hospitalized in the last 10 days? No Do you have a fever? No Do you have a cough? No Do you have a headache or sore throat? No  

## 2019-10-18 ENCOUNTER — Telehealth: Payer: Self-pay | Admitting: Emergency Medicine

## 2019-10-18 NOTE — Telephone Encounter (Signed)
Dupixent Order: 300mg  #2 prefilled syringe Ordered Date: 10/18/2019 Expected date of arrival: 10/21/2019 Ordered by: 12/21/2019, CMA  Specialty Pharmacy: [facility]

## 2019-10-19 ENCOUNTER — Other Ambulatory Visit: Payer: Self-pay

## 2019-10-19 ENCOUNTER — Ambulatory Visit: Payer: 59 | Admitting: Emergency Medicine

## 2019-10-19 ENCOUNTER — Encounter: Payer: Self-pay | Admitting: Emergency Medicine

## 2019-10-19 DIAGNOSIS — J4550 Severe persistent asthma, uncomplicated: Secondary | ICD-10-CM | POA: Diagnosis not present

## 2019-10-19 DIAGNOSIS — G4733 Obstructive sleep apnea (adult) (pediatric): Secondary | ICD-10-CM

## 2019-10-19 NOTE — Assessment & Plan Note (Signed)
Good compliance with his CPAP.  Continue same.

## 2019-10-19 NOTE — Patient Instructions (Signed)
We will change her prednisone as follows: -Take 20 mg daily for the next 5 days -Then change to 5 mg twice a day and stay on that dose until follow-up Continue Trelegy Continue Singulair, Flonase, Zyrtec, nasal saline rinses Continue your Dupixent on your established schedule We will refer you locally to Allergy to consider possible repeat skin testing, assess whether immunotherapy (allergy shots) may be helpful. Continue to wear your CPAP every night as you have been doing. Follow with Dr Delton Coombes in 1 month

## 2019-10-19 NOTE — Progress Notes (Signed)
Subjective:    Patient ID: Jose Ross, male    DOB: July 27, 1970, 49 y.o.   MRN: [id number]  Asthma He complains of cough. There is no shortness of breath or wheezing. Pertinent negatives include no ear pain, fever, headaches, postnasal drip, rhinorrhea, sneezing, sore throat or trouble swallowing. His past medical history is significant for asthma.  Shortness of Breath Pertinent negatives include no ear pain, fever, headaches, leg swelling, rash, rhinorrhea, sore throat, vomiting or wheezing. His past medical history is significant for asthma.     ROV 08/11/2019 --follow-up visit today for 49 year old gentleman with moderate persistent asthma and hypereosinophilic phenotype.  Has benefited significantly from starting [place].  We have been able to wean his prednisone but he had adrenal insufficiency.  He is now on prednisone 5 mg twice daily because he was unable to get hydrocortisone from endocrinology, difficulty with arranging that appointment.  Since making the transition of hydrocortisone to prednisone his lower extremity edema has actually improved.  His other regimen includes Singulair, Flonase, Zyrtec, . He reports that his breathing is stable, can feel a weight on his chest. Has a lot less edema. Activity level is a bit improved. He is interested in increasing his activity to lose some weight.   ROV 09/13/2019 --49 year old man with moderate persistent asthma, hypereosinophilic phenotype and allergic rhinitis. He is on Dupixent and has benefited significantly.  Has also been on chronic steroids for this as well as contribution of adrenal insufficiency.  At his last visit we tried decreasing to 5 mg in the morning, 2.5 mg in the evening.  He reports that he is tolerating the decrease in pred to 5mg / 2.5mg . he has done well since making the change - has not backtracked in any way.  Still with nocturnal awakenings for dyspnea, frequent albuterol use.  He wants to go back to work in April if  possible.  No flares  ROV 10/19/19 --Jose Ross is 42 with a history of difficult to manage moderate persistent asthma with a hyper eosinophilic phenotype and allergic rhinitis.  He had been dependent on high to moderate dose steroids, improved since he started Dupixent which she has continued.  We had weaned his prednisone down to 2.5 mg twice a day.  He is on Trelegy, Singulair, Flonase, Zyrtec, nasal lavage w saline.  He has albuterol which he uses a few times a week.   He had been doing well - began to develop increased drainage, cough, then wheeze last Thursday.   Last visit we had talked about trying to get him back to work on 4/1.  He reports that he was unable to return to work due to current sx.   His eosinophil count has decreased based on labs 09/16/19  Also with obstructive sleep apnea on CPAP.  He states that he uses it reliably nightly.   Review of Systems  Constitutional: Negative for fever and unexpected weight change.  HENT: Positive for congestion. Negative for dental problem, ear pain, nosebleeds, postnasal drip, rhinorrhea, sinus pressure, sneezing, sore throat and trouble swallowing.   Eyes: Negative for redness and itching.  Respiratory: Positive for cough and chest tightness. Negative for shortness of breath and wheezing.   Cardiovascular: Negative for palpitations and leg swelling.  Gastrointestinal: Negative for nausea and vomiting.  Genitourinary: Negative for dysuria.  Musculoskeletal: Negative for joint swelling.  Skin: Negative for rash.  Allergic/Immunologic: Positive for environmental allergies. Negative for food allergies and immunocompromised state.  Neurological: Negative for headaches.  Hematological: Does not  bruise/bleed easily.  Psychiatric/Behavioral: Negative for dysphoric mood. The patient is not nervous/anxious.         Objective:   Physical Exam Vitals:   10/19/19 1147  BP: 128/82  Pulse: 86  Temp: (!) 97.1 F (36.2 C)  TempSrc: Temporal  SpO2:  99%  Weight: 280 lb 9.6 oz (127.3 kg)  Height: 5\' 10"  (1.778 m)   Gen: Pleasant, well-nourished, intermittently clearing throat, normal affect, cushingoid facies  ENT: No lesions,  mouth clear,  oropharynx clear but crowded, insp and exp UA noise,low litched   Lungs: No use of accessory muscles, referred UA noise, mild end exp wheeze  Cardiovascular: RRR, heart sounds normal, no murmur or gallops, 1+ pretibial peripheral edema  Musculoskeletal: No deformities, no cyanosis or clubbing  Neuro: alert, non focal  Skin: Warm, no lesions or rash     Assessment & Plan:  Asthma Moderate to severe persistent asthma.  Flaring symptoms for the last several days, suspect due to increased allergy symptoms.  He is on a good allergy regimen and is compliant.  Dupixent has been helpful but this is his first spring, first flare.  Continue his bronchodilator regimen as ordered and Dupixent schedule.  He wonders whether a repeat allergy evaluation and skin testing may reveal something that would benefit from immunotherapy.  I will make another referral locally.  We will change her prednisone as follows: -Take 20 mg daily for the next 5 days -Then change to 5 mg twice a day and stay on that dose until follow-up Continue Trelegy Continue Singulair, Flonase, Zyrtec, nasal saline rinses Continue your Dupixent on your established schedule We will refer you locally to Allergy to consider possible repeat skin testing, assess whether immunotherapy (allergy shots) may be helpful. Follow with Dr 02-03-1991 in 1 month  Obstructive sleep apnea Good compliance with his CPAP.  Continue same.  Delton Coombes, MD, PhD 10/19/2019, 12:11 PM [facility] [phone] or if no answer [phone]

## 2019-10-19 NOTE — Assessment & Plan Note (Signed)
Moderate to severe persistent asthma.  Flaring symptoms for the last several days, suspect due to increased allergy symptoms.  He is on a good allergy regimen and is compliant.  Dupixent has been helpful but this is his first spring, first flare.  Continue his bronchodilator regimen as ordered and Dupixent schedule.  He wonders whether a repeat allergy evaluation and skin testing may reveal something that would benefit from immunotherapy.  I will make another referral locally.  We will change her prednisone as follows: -Take 20 mg daily for the next 5 days -Then change to 5 mg twice a day and stay on that dose until follow-up Continue Trelegy Continue Singulair, Flonase, Zyrtec, nasal saline rinses Continue your Dupixent on your established schedule We will refer you locally to Allergy to consider possible repeat skin testing, assess whether immunotherapy (allergy shots) may be helpful. Follow with Dr Delton Coombes in 1 month

## 2019-10-21 NOTE — Telephone Encounter (Signed)
Dupixent Shipment Received: 300mg  #2 prefilled syringe Medication arrival date: 10/21/19 Lot #: [id number] Exp date: 0//30/2023 Received by: 09-11-1976

## 2019-10-25 ENCOUNTER — Ambulatory Visit: Payer: 59

## 2019-10-26 ENCOUNTER — Other Ambulatory Visit: Payer: Self-pay

## 2019-10-26 ENCOUNTER — Ambulatory Visit (INDEPENDENT_AMBULATORY_CARE_PROVIDER_SITE_OTHER): Payer: 59

## 2019-10-26 DIAGNOSIS — J4550 Severe persistent asthma, uncomplicated: Secondary | ICD-10-CM | POA: Diagnosis not present

## 2019-10-26 MED ORDER — DUPILUMAB 300 MG/2ML ~~LOC~~ SOSY
300.0000 mg | PREFILLED_SYRINGE | Freq: Once | SUBCUTANEOUS | Status: AC
  Administered 2019-10-26: 300 mg via SUBCUTANEOUS

## 2019-10-26 NOTE — Progress Notes (Signed)
All questions were answered by the patient before medication was administered. Have you been hospitalized in the last 10 days? No Do you have a fever? No Do you have a cough? No Do you have a headache or sore throat? No  

## 2019-10-29 ENCOUNTER — Telehealth: Payer: Self-pay | Admitting: Emergency Medicine

## 2019-11-02 NOTE — Telephone Encounter (Signed)
Rec'd completed forms - fwd to [place] via interoffice mail -pr  °

## 2019-11-09 ENCOUNTER — Ambulatory Visit (INDEPENDENT_AMBULATORY_CARE_PROVIDER_SITE_OTHER): Payer: 59

## 2019-11-09 ENCOUNTER — Other Ambulatory Visit: Payer: Self-pay

## 2019-11-09 DIAGNOSIS — J4550 Severe persistent asthma, uncomplicated: Secondary | ICD-10-CM

## 2019-11-09 MED ORDER — DUPILUMAB 300 MG/2ML ~~LOC~~ SOSY
300.0000 mg | PREFILLED_SYRINGE | Freq: Once | SUBCUTANEOUS | Status: AC
  Administered 2019-11-09: 14:00:00 300 mg via SUBCUTANEOUS

## 2019-11-09 NOTE — Progress Notes (Signed)
All questions were answered by the patient before medication was administered. Have you been hospitalized in the last 10 days? No Do you have a fever? No Do you have a cough? No Do you have a headache or sore throat? No  

## 2019-11-15 ENCOUNTER — Telehealth: Payer: Self-pay | Admitting: Emergency Medicine

## 2019-11-15 NOTE — Telephone Encounter (Signed)
Dupixent Order: 300mg  #2 prefilled syringe Ordered Date: 11/15/19 Expected date of arrival: 11/18/19 Ordered by: Jaxon Flatt,LPN Specialty Pharmacy: 01/18/20

## 2019-11-18 NOTE — Telephone Encounter (Signed)
Dupixent Shipment Received: 300mg  #2 prefilled syringe Medication arrival date: 11/18/2019 Lot #: [id number]  Exp date: 02/2022 Received by: 03/2022, CMA

## 2019-11-23 ENCOUNTER — Ambulatory Visit (INDEPENDENT_AMBULATORY_CARE_PROVIDER_SITE_OTHER): Payer: 59

## 2019-11-23 ENCOUNTER — Other Ambulatory Visit: Payer: Self-pay

## 2019-11-23 DIAGNOSIS — J4550 Severe persistent asthma, uncomplicated: Secondary | ICD-10-CM

## 2019-11-23 MED ORDER — DUPILUMAB 300 MG/2ML ~~LOC~~ SOSY
300.0000 mg | PREFILLED_SYRINGE | Freq: Once | SUBCUTANEOUS | Status: AC
  Administered 2019-11-23: 300 mg via SUBCUTANEOUS

## 2019-11-23 NOTE — Progress Notes (Signed)
Have you been hospitalized within the last 10 days?  No Do you have a fever?  No Do you have a cough?  No Do you have a headache or sore throat? No  

## 2019-12-02 ENCOUNTER — Ambulatory Visit: Payer: 59 | Admitting: Emergency Medicine

## 2019-12-02 ENCOUNTER — Encounter: Payer: Self-pay | Admitting: Emergency Medicine

## 2019-12-02 ENCOUNTER — Other Ambulatory Visit: Payer: Self-pay

## 2019-12-02 DIAGNOSIS — G4733 Obstructive sleep apnea (adult) (pediatric): Secondary | ICD-10-CM | POA: Diagnosis not present

## 2019-12-02 DIAGNOSIS — J4550 Severe persistent asthma, uncomplicated: Secondary | ICD-10-CM

## 2019-12-02 MED ORDER — ALBUTEROL SULFATE HFA 108 (90 BASE) MCG/ACT IN AERS: 2.0000 | INHALATION_SPRAY | Freq: Four times a day (QID) | RESPIRATORY_TRACT | 5 refills | Status: DC | PRN

## 2019-12-02 NOTE — Addendum Note (Signed)
Addended by: Dorisann Frames R on: 12/02/2019 02:45 PM   Modules accepted: Orders

## 2019-12-02 NOTE — Assessment & Plan Note (Signed)
Continue your CPAP every night. 

## 2019-12-02 NOTE — Assessment & Plan Note (Signed)
Please continue prednisone 5 mg twice daily Continue Dupixent as per your current schedule Continue your Trelegy, Singulair, Zyrtec, Flonase, nasal rinses as you have been doing them. Keep albuterol available to use 2 puffs if needed for shortness of breath, chest tightness, wheezing.  We will change your Ventolin to an alternative based on your insurance formulary Agree that based on your stability on her current regimen it is okay to try to go back to work.  We will write a letter to give permission to return on 12/14/2019. Follow with Dr Delton Coombes in 3 months or sooner if you have any problems

## 2019-12-02 NOTE — Patient Instructions (Addendum)
Please continue prednisone 5 mg twice daily Continue Dupixent as per your current schedule Continue your Trelegy, Singulair, Zyrtec, Flonase, nasal rinses as you have been doing them. Keep albuterol available to use 2 puffs if needed for shortness of breath, chest tightness, wheezing.  We will change your Ventolin to an alternative based on your insurance formulary Agree that based on your stability on her current regimen it is okay to try to go back to work.  We will write a letter to give permission to return on 12/14/2019. Continue your CPAP every night Follow with Dr Delton Coombes in 3 months or sooner if you have any problems.

## 2019-12-02 NOTE — Progress Notes (Signed)
Subjective:    Patient ID: Jose Ross, male    DOB: June 06, 1971, 49 y.o.   MRN: [id number]  Asthma He complains of cough. There is no shortness of breath or wheezing. Pertinent negatives include no ear pain, fever, headaches, postnasal drip, rhinorrhea, sneezing, sore throat or trouble swallowing. His past medical history is significant for asthma.  Shortness of Breath Pertinent negatives include no ear pain, fever, headaches, leg swelling, rash, rhinorrhea, sore throat, vomiting or wheezing. His past medical history is significant for asthma.     ROV 08/11/2019 --follow-up visit today for 49 year old gentleman with moderate persistent asthma and hypereosinophilic phenotype.  Has benefited significantly from starting [place].  We have been able to wean his prednisone but he had adrenal insufficiency.  He is now on prednisone 5 mg twice daily because he was unable to get hydrocortisone from endocrinology, difficulty with arranging that appointment.  Since making the transition of hydrocortisone to prednisone his lower extremity edema has actually improved.  His other regimen includes Singulair, Flonase, Zyrtec, . He reports that his breathing is stable, can feel a weight on his chest. Has a lot less edema. Activity level is a bit improved. He is interested in increasing his activity to lose some weight.   ROV 09/13/2019 --49 year old man with moderate persistent asthma, hypereosinophilic phenotype and allergic rhinitis. He is on Dupixent and has benefited significantly.  Has also been on chronic steroids for this as well as contribution of adrenal insufficiency.  At his last visit we tried decreasing to 5 mg in the morning, 2.5 mg in the evening.  He reports that he is tolerating the decrease in pred to 5mg / 2.5mg . he has done well since making the change - has not backtracked in any way.  Still with nocturnal awakenings for dyspnea, frequent albuterol use.  He wants to go back to work in April if  possible.  No flares  ROV 10/19/19 --Jose Ross is 49 with a history of difficult to manage moderate persistent asthma with a hyper eosinophilic phenotype and allergic rhinitis.  He had been dependent on high to moderate dose steroids, improved since he started Dupixent which she has continued.  We had weaned his prednisone down to 2.5 mg twice a day.  He is on Trelegy, Singulair, Flonase, Zyrtec, nasal lavage w saline.  He has albuterol which he uses a few times a week.   He had been doing well - began to develop increased drainage, cough, then wheeze last Thursday.   Last visit we had talked about trying to get him back to work on 4/1.  He reports that he was unable to return to work due to current sx.   His eosinophil count has decreased based on labs 09/16/19  Also with obstructive sleep apnea on CPAP.  He states that he uses it reliably nightly.  ROV 12/02/19 --follow-up visit for moderate persistent asthma and hypereosinophilic phenotype, difficult to manage and has required high to moderate dose steroids in the past.  This is improved when he started on Dupixent.  Prednisone was down to 2.5 mg twice a day but he was having symptoms so I increased him back to 5 mg twice a day.  I also referred him to allergy to see if he would benefit from immunotherapy.  His maintenance regimen is Trelegy, Singulair, Flonase, Zyrtec, nasal rinses. He feels better, less dyspnea. He is using ventolin about once a day, sometimes more depending on activity. Good compliance with his CPAP.   He is  ready to go back to work, hopes that he will be able to go to full duty   Review of Systems  Constitutional: Negative for fever and unexpected weight change.  HENT: Positive for congestion. Negative for dental problem, ear pain, nosebleeds, postnasal drip, rhinorrhea, sinus pressure, sneezing, sore throat and trouble swallowing.   Eyes: Negative for redness and itching.  Respiratory: Positive for cough and chest tightness.  Negative for shortness of breath and wheezing.   Cardiovascular: Negative for palpitations and leg swelling.  Gastrointestinal: Negative for nausea and vomiting.  Genitourinary: Negative for dysuria.  Musculoskeletal: Negative for joint swelling.  Skin: Negative for rash.  Allergic/Immunologic: Positive for environmental allergies. Negative for food allergies and immunocompromised state.  Neurological: Negative for headaches.  Hematological: Does not bruise/bleed easily.  Psychiatric/Behavioral: Negative for dysphoric mood. The patient is not nervous/anxious.         Objective:   Physical Exam Vitals:   12/02/19 1328  BP: 130/68  Pulse: 92  Temp: 97.8 F (36.6 C)  TempSrc: Temporal  SpO2: 98%  Weight: 278 lb 4.8 oz (126.2 kg)  Height: 5\' 10"  (1.778 m)   Gen: Pleasant, well-nourished, intermittently clearing throat, normal affect, cushingoid facies  ENT: No lesions,  mouth clear,  oropharynx clear but crowded, insp and exp UA noise,low litched   Lungs: No use of accessory muscles, referred UA noise, mild end exp wheeze  Cardiovascular: RRR, heart sounds normal, no murmur or gallops, 1+ pretibial peripheral edema  Musculoskeletal: No deformities, no cyanosis or clubbing  Neuro: alert, non focal  Skin: Warm, no lesions or rash     Assessment & Plan:  No problem-specific Assessment & Plan notes found for this encounter.  , MD, PhD 12/02/2019, 1:31 PM [facility] [phone] or if no answer [phone]

## 2019-12-07 ENCOUNTER — Other Ambulatory Visit: Payer: Self-pay

## 2019-12-07 ENCOUNTER — Ambulatory Visit (INDEPENDENT_AMBULATORY_CARE_PROVIDER_SITE_OTHER): Payer: 59

## 2019-12-07 DIAGNOSIS — J4550 Severe persistent asthma, uncomplicated: Secondary | ICD-10-CM

## 2019-12-07 MED ORDER — DUPILUMAB 300 MG/2ML ~~LOC~~ SOSY
300.0000 mg | PREFILLED_SYRINGE | Freq: Once | SUBCUTANEOUS | Status: AC
  Administered 2019-12-07: 300 mg via SUBCUTANEOUS

## 2019-12-07 NOTE — Progress Notes (Signed)
All questions were answered by the patient before medication was administered. Have you been hospitalized in the last 10 days? No Do you have a fever? No Do you have a cough? No Do you have a headache or sore throat? No  

## 2019-12-10 ENCOUNTER — Telehealth: Payer: Self-pay | Admitting: Emergency Medicine

## 2019-12-14 NOTE — Telephone Encounter (Signed)
Rec'd completed paperwork - fwd to [facility] via interoffice mail -pr  

## 2019-12-15 ENCOUNTER — Telehealth: Payer: Self-pay | Admitting: Emergency Medicine

## 2019-12-15 NOTE — Telephone Encounter (Signed)
Dupixent Order: 300mg #2 prefilled syringe Ordered Date: 12/15/2019 Expected date of arrival: 12/21/2019 Ordered by: Ender Rorke, CMA  Specialty Pharmacy: [facility]  

## 2019-12-21 ENCOUNTER — Ambulatory Visit (INDEPENDENT_AMBULATORY_CARE_PROVIDER_SITE_OTHER): Payer: 59

## 2019-12-21 ENCOUNTER — Other Ambulatory Visit: Payer: Self-pay

## 2019-12-21 ENCOUNTER — Ambulatory Visit: Payer: 59

## 2019-12-21 DIAGNOSIS — J4550 Severe persistent asthma, uncomplicated: Secondary | ICD-10-CM

## 2019-12-21 MED ORDER — DUPILUMAB 300 MG/2ML ~~LOC~~ SOSY
300.0000 mg | PREFILLED_SYRINGE | Freq: Once | SUBCUTANEOUS | Status: AC
  Administered 2019-12-21: 300 mg via SUBCUTANEOUS

## 2019-12-21 NOTE — Telephone Encounter (Signed)
Dupixent Shipment Received: 300mg #2 prefilled syringe Medication arrival date: 12/21/19 Lot #: [id number] Exp date: 04/13/2022 Received by: Bernis Stecher,LPN 

## 2019-12-21 NOTE — Progress Notes (Signed)
Have you been hospitalized within the last 10 days?  No Do you have a fever?  No Do you have a cough?  No Do you have a headache or sore throat? No Do you have your Epi Pen visible and is it within date?  Yes 

## 2020-01-04 ENCOUNTER — Ambulatory Visit (INDEPENDENT_AMBULATORY_CARE_PROVIDER_SITE_OTHER): Payer: 59

## 2020-01-04 ENCOUNTER — Other Ambulatory Visit: Payer: Self-pay

## 2020-01-04 ENCOUNTER — Telehealth: Payer: Self-pay | Admitting: Emergency Medicine

## 2020-01-04 DIAGNOSIS — J4550 Severe persistent asthma, uncomplicated: Secondary | ICD-10-CM

## 2020-01-04 MED ORDER — DUPILUMAB 300 MG/2ML ~~LOC~~ SOSY
300.0000 mg | PREFILLED_SYRINGE | Freq: Once | SUBCUTANEOUS | Status: AC
  Administered 2020-01-04: 300 mg via SUBCUTANEOUS

## 2020-01-04 NOTE — Telephone Encounter (Signed)
Pt came in today for his Dupixent injection. He has gone back to work and is working 12 hour shifts. It's going to be difficult for the pt to work out times to be able to come for injections every 2 weeks.  RB - can we start the transition for the pt to self administer at home?

## 2020-01-04 NOTE — Progress Notes (Signed)
All questions were answered by the patient before medication was administered. Have you been hospitalized in the last 10 days? No Do you have a fever? No Do you have a cough? No Do you have a headache or sore throat? No  

## 2020-01-05 ENCOUNTER — Other Ambulatory Visit: Payer: Self-pay | Admitting: Emergency Medicine

## 2020-01-05 MED ORDER — DUPIXENT 300 MG/2ML ~~LOC~~ SOAJ: 300.0000 mg | SUBCUTANEOUS | 11 refills | Status: DC

## 2020-01-05 NOTE — Telephone Encounter (Signed)
Rx has been sent in for Dupixent pen. Will route to pharmacy team to see if PA will be needed for the pen.

## 2020-01-05 NOTE — Telephone Encounter (Signed)
Submitted a Prior Authorization request to [facility] for DUPIXENT PENS via Cover My Meds. Will update once we receive a response.   (KeyCora Daniels) - [id number]

## 2020-01-05 NOTE — Telephone Encounter (Signed)
Yes I agree with that plan. Thanks for helping him

## 2020-01-10 ENCOUNTER — Telehealth: Payer: Self-pay | Admitting: Emergency Medicine

## 2020-01-10 NOTE — Telephone Encounter (Signed)
PA for this patient was just approved today. Will call pharmacy tomorrow to follow up.

## 2020-01-10 NOTE — Telephone Encounter (Signed)
Received notification from [facility] regarding a prior authorization for DUPIXENT Pen. Authorization has been APPROVED from 01/05/20 to 01/04/21.   Authorization # [id number] Phone # [phone]

## 2020-01-10 NOTE — Telephone Encounter (Signed)
Called [facility] to set up shipment of pt's Dupixent pen. °Was advised that insurance is requiring a PA for the pens. °Per documentation, PA is not needed. °I gave this information to the pharmacy but it was still getting a rejected claim. °

## 2020-01-11 NOTE — Telephone Encounter (Signed)
LMTCB x1 for pt. His appointment will need to be moved as his appointment is on 01/19/20 at this time.

## 2020-01-11 NOTE — Telephone Encounter (Signed)
Called [facility], spoke to rep Rhonda, requested claim to be rerun and she was able to get a paid claim while I was on the phone. Rep states they will need to obtain a new patient consent before they can ship 1st new prescription to the office. She scheduled for shipment to deliver to office on 01/20/20. Advised they will contact the office with any delays.  Phone# [phone]

## 2020-01-12 NOTE — Telephone Encounter (Signed)
Spoke with pt. His appointment has been moved to 01/21/2020 at 1400. Will await shipment.

## 2020-01-12 NOTE — Telephone Encounter (Signed)
Patient is returning phone call. Patient phone number is [phone].

## 2020-01-19 ENCOUNTER — Ambulatory Visit: Payer: 59

## 2020-01-20 NOTE — Telephone Encounter (Signed)
Dupixent Shipment Received: 300mg  #2 prefilled syringe Medication arrival date: 01/20/20 Lot #: [id number] Exp date: 08/14/2021 Received by: 08/16/2021

## 2020-01-21 ENCOUNTER — Ambulatory Visit: Payer: 59

## 2020-01-28 ENCOUNTER — Ambulatory Visit (INDEPENDENT_AMBULATORY_CARE_PROVIDER_SITE_OTHER): Payer: 59

## 2020-01-28 ENCOUNTER — Other Ambulatory Visit: Payer: Self-pay

## 2020-01-28 DIAGNOSIS — J4550 Severe persistent asthma, uncomplicated: Secondary | ICD-10-CM | POA: Diagnosis not present

## 2020-01-28 MED ORDER — DUPILUMAB 300 MG/2ML ~~LOC~~ SOSY
300.0000 mg | PREFILLED_SYRINGE | Freq: Once | SUBCUTANEOUS | Status: AC
  Administered 2020-01-28: 300 mg via SUBCUTANEOUS

## 2020-01-28 NOTE — Progress Notes (Signed)
Have you been hospitalized within the last 10 days?  No Do you have a fever?  No Do you have a cough?  No Do you have a headache or sore throat? No Do you have your Epi Pen visible and is it within date?  Yes    Patient was demonstrated injection with demo pen.  Patient demonstrated with demo pen, then administered Dupixent pen in lower abd. Area. Questions answered, injection sites reviewed.  Nothing further at this time.

## 2020-03-27 ENCOUNTER — Inpatient Hospital Stay (HOSPITAL_COMMUNITY): Payer: 59 | Attending: Hematology

## 2020-03-27 ENCOUNTER — Other Ambulatory Visit: Payer: Self-pay

## 2020-03-27 ENCOUNTER — Inpatient Hospital Stay (HOSPITAL_COMMUNITY): Payer: 59 | Admitting: Hematology

## 2020-03-27 VITALS — BP 140/88 | HR 78 | Temp 97.1°F | Resp 18 | Wt 260.0 lb

## 2020-03-27 DIAGNOSIS — Z7952 Long term (current) use of systemic steroids: Secondary | ICD-10-CM | POA: Diagnosis not present

## 2020-03-27 DIAGNOSIS — J4540 Moderate persistent asthma, uncomplicated: Secondary | ICD-10-CM | POA: Insufficient documentation

## 2020-03-27 DIAGNOSIS — D7219 Other eosinophilia: Secondary | ICD-10-CM | POA: Diagnosis not present

## 2020-03-27 DIAGNOSIS — Z87891 Personal history of nicotine dependence: Secondary | ICD-10-CM | POA: Insufficient documentation

## 2020-03-27 DIAGNOSIS — D721 Eosinophilia, unspecified: Secondary | ICD-10-CM | POA: Insufficient documentation

## 2020-03-27 DIAGNOSIS — Z79899 Other long term (current) drug therapy: Secondary | ICD-10-CM | POA: Insufficient documentation

## 2020-03-27 LAB — CBC WITH DIFFERENTIAL/PLATELET
Abs Immature Granulocytes: 0.02 10*3/uL (ref 0.00–0.07)
Basophils Absolute: 0.1 10*3/uL (ref 0.0–0.1)
Basophils Relative: 1 %
Eosinophils Absolute: 0.2 10*3/uL (ref 0.0–0.5)
Eosinophils Relative: 3 %
HCT: 48.5 % (ref 39.0–52.0)
Hemoglobin: 16.3 g/dL (ref 13.0–17.0)
Immature Granulocytes: 0 %
Lymphocytes Relative: 20 %
Lymphs Abs: 1.5 10*3/uL (ref 0.7–4.0)
MCH: 31.6 pg (ref 26.0–34.0)
MCHC: 33.6 g/dL (ref 30.0–36.0)
MCV: 94 fL (ref 80.0–100.0)
Monocytes Absolute: 0.6 10*3/uL (ref 0.1–1.0)
Monocytes Relative: 8 %
Neutro Abs: 5.1 10*3/uL (ref 1.7–7.7)
Neutrophils Relative %: 68 %
Platelets: 195 10*3/uL (ref 150–400)
RBC: 5.16 MIL/uL (ref 4.22–5.81)
RDW: 12.2 % (ref 11.5–15.5)
WBC: 7.5 10*3/uL (ref 4.0–10.5)
nRBC: 0 % (ref 0.0–0.2)

## 2020-03-27 LAB — COMPREHENSIVE METABOLIC PANEL
ALT: 21 U/L (ref 0–44)
AST: 19 U/L (ref 15–41)
Albumin: 4.1 g/dL (ref 3.5–5.0)
Alkaline Phosphatase: 96 U/L (ref 38–126)
Anion gap: 10 (ref 5–15)
BUN: 17 mg/dL (ref 6–20)
CO2: 24 mmol/L (ref 22–32)
Calcium: 8.9 mg/dL (ref 8.9–10.3)
Chloride: 104 mmol/L (ref 98–111)
Creatinine, Ser: 1.09 mg/dL (ref 0.61–1.24)
GFR calc Af Amer: >60 mL/min (ref >60)
GFR calc non Af Amer: >60 mL/min (ref >60)
Glucose, Bld: 109 mg/dL — ABNORMAL HIGH (ref 70–99)
Potassium: 4 mmol/L (ref 3.5–5.1)
Sodium: 138 mmol/L (ref 135–145)
Total Bilirubin: 0.9 mg/dL (ref 0.3–1.2)
Total Protein: 6.7 g/dL (ref 6.5–8.1)

## 2020-03-27 LAB — LACTATE DEHYDROGENASE: LDH: 145 U/L (ref 98–192)

## 2020-03-27 NOTE — Patient Instructions (Signed)
[facility] at [facility] Discharge Instructions  You were seen today by Dr. Katragadda. He went over your recent results and scans. Dr. Katragadda will see you back in 1 year for labs and follow up.   Thank you for choosing [facility] at [facility] to provide your oncology and hematology care.  To afford each patient quality time with our provider, please arrive at least 15 minutes before your scheduled appointment time.   If you have a lab appointment with the Cancer Center please come in thru the Main Entrance and check in at the main information desk  You need to re-schedule your appointment should you arrive 10 or more minutes late.  We strive to give you quality time with our providers, and arriving late affects you and other patients whose appointments are after yours.  Also, if you no show three or more times for appointments you may be dismissed from the clinic at the providers discretion.     Again, thank you for choosing [facility].  Our hope is that these requests will decrease the amount of time that you wait before being seen by our physicians.       _____________________________________________________________  Should you have questions after your visit to [facility], please contact our office at [phone] between the hours of 8:00 a.m. and 4:30 p.m.  Voicemails left after 4:00 p.m. will not be returned until the following business day.  For prescription refill requests, have your pharmacy contact our office and allow 72 hours.    Cancer Center Support Programs:   > Cancer Support Group  2nd Tuesday of the month 1pm-2pm, Journey Room   

## 2020-03-27 NOTE — Progress Notes (Signed)
[address] [place], [place] [address]   CLINIC:  Medical Oncology/Hematology  PCP:  Patient, No Pcp Per None  None  REASON FOR VISIT:  Follow-up for eosinophilia  PRIOR THERAPY: None  CURRENT THERAPY: Observation  INTERVAL HISTORY:  Mr. Jose Ross, a 49 y.o. male, returns for routine follow-up for his eosinophilia. Kaitlin was last seen on 11/11/2018.  Today he reports feeling good. His SOB has improved greatly with the nebulizers and inhalers; he continues using CPAP at night. He denies having any rashes or itching, F/C or night sweats. He continues taking prednisone 2.5 mg in AM and 2.5 mg in PM.  He started working again in July at the same place and is able to do all of his duties. He is tolerating the work well and has not had any more reactions.   REVIEW OF SYSTEMS:  Review of Systems  Constitutional: Negative for appetite change, chills, diaphoresis, fatigue and fever.  Respiratory: Positive for cough and shortness of breath (w/ exertion; stable).   Skin: Negative for itching and rash.  All other systems reviewed and are negative.   PAST MEDICAL/SURGICAL HISTORY:  Past Medical History:  Diagnosis Date  . Asthma   . Eosinophilia   . Pulmonary nodules    No past surgical history on file.  SOCIAL HISTORY:  Social History   Socioeconomic History  . Marital status: Single    Spouse name: Not on file  . Number of children: 2  . Years of education: Not on file  . Highest education level: Not on file  Occupational History  . Not on file  Tobacco Use  . Smoking status: Former Smoker    Packs/day: 0.25    Years: 10.00    Pack years: 2.50    Types: Cigarettes    Quit date: 2000    Years since quitting: 21.7  . Smokeless tobacco: Never Used  Vaping Use  . Vaping Use: Never used  Substance and Sexual Activity  . Alcohol use: Yes    Comment: 4-5 drinks a week  . Drug use: Not Currently  . Sexual activity: Not on file    Other Topics Concern  . Not on file  Social History Narrative  . Not on file   Social Determinants of Health   Financial Resource Strain:   . Difficulty of Paying Living Expenses: Not on file  Food Insecurity:   . Worried About Charity fundraiser in the Last Year: Not on file  . Ran Out of Food in the Last Year: Not on file  Transportation Needs:   . Lack of Transportation (Medical): Not on file  . Lack of Transportation (Non-Medical): Not on file  Physical Activity:   . Days of Exercise per Week: Not on file  . Minutes of Exercise per Session: Not on file  Stress:   . Feeling of Stress : Not on file  Social Connections:   . Frequency of Communication with Friends and Family: Not on file  . Frequency of Social Gatherings with Friends and Family: Not on file  . Attends Religious Services: Not on file  . Active Member of Clubs or Organizations: Not on file  . Attends Archivist Meetings: Not on file  . Marital Status: Not on file  Intimate Partner Violence:   . Fear of Current or Ex-Partner: Not on file  . Emotionally Abused: Not on file  . Physically Abused: Not on file  . Sexually Abused:  Not on file    FAMILY HISTORY:  Family History  Problem Relation Age of Onset  . Asthma Mother   . Skin cancer Mother     CURRENT MEDICATIONS:  Current Outpatient Medications  Medication Sig Dispense Refill  . albuterol (PROVENTIL) (2.5 MG/3ML) 0.083% nebulizer solution Take 3 mLs (2.5 mg total) by nebulization every 4 (four) hours as needed for wheezing or shortness of breath. 150 mL 5  . albuterol (VENTOLIN HFA) 108 (90 Base) MCG/ACT inhaler Inhale 2 puffs into the lungs every 6 (six) hours as needed for wheezing or shortness of breath. 18 g 5  . cetirizine (ZYRTEC) 10 MG tablet Take 10 mg by mouth daily.    . Dupilumab (DUPIXENT) 300 MG/2ML SOPN Inject 300 mg into the skin every 14 (fourteen) days. 2 pen 11  . EPINEPHrine 0.3 mg/0.3 mL IJ SOAJ injection Inject one dose  intramuscularly for allergic reaction. May repeat one dose if needed after 5-15 minutes. Proceed to the ER  11  . fluticasone (FLONASE) 50 MCG/ACT nasal spray Place 2 sprays into both nostrils daily.    . Fluticasone-Umeclidin-Vilant 100-62.5-25 MCG/INH AEPB Inhale 1 puff into the lungs daily.     . furosemide (LASIX) 20 MG tablet Take 1 tablet (20 mg total) by mouth daily. 4 tablet 0  . hydrocortisone (CORTEF) 10 MG tablet Take 1 and 1/2 tablets every morning and 1 tablet every evening 75 tablet 6  . montelukast (SINGULAIR) 10 MG tablet Take 1 tablet (10 mg total) by mouth at bedtime. 30 tablet 5  . omeprazole (PRILOSEC) 20 MG capsule Take 1 capsule (20 mg total) by mouth 2 (two) times daily before a meal. (Patient taking differently: Take 20 mg by mouth daily. ) 60 capsule 1  . predniSONE (DELTASONE) 5 MG tablet 5 mg in the am and 2.5 mg in the pm (Patient taking differently: 2.5 mg in the am and 2.5 mg in the pm) 60 tablet 2   No current facility-administered medications for this visit.    ALLERGIES:  No Known Allergies  PHYSICAL EXAM:  Performance status (ECOG): 0 - Asymptomatic  Vitals:   03/27/20 1456  BP: 140/88  Pulse: 78  Resp: 18  Temp: (!) 97.1 F (36.2 C)  SpO2: 99%   Wt Readings from Last 3 Encounters:  03/27/20 260 lb (117.9 kg)  12/02/19 278 lb 4.8 oz (126.2 kg)  10/19/19 280 lb 9.6 oz (127.3 kg)   Physical Exam Vitals reviewed.  Constitutional:      Appearance: Normal appearance. He is obese.  Cardiovascular:     Rate and Rhythm: Normal rate and regular rhythm.     Pulses: Normal pulses.     Heart sounds: Normal heart sounds.  Pulmonary:     Effort: Pulmonary effort is normal.     Breath sounds: Normal breath sounds.  Abdominal:     Palpations: Abdomen is soft. There is no hepatomegaly, splenomegaly or mass.     Tenderness: There is no abdominal tenderness.     Hernia: No hernia is present.  Musculoskeletal:     Right lower leg: No edema.     Left  lower leg: No edema.  Lymphadenopathy:     Lower Body: No right inguinal adenopathy. No left inguinal adenopathy.  Neurological:     General: No focal deficit present.     Mental Status: He is alert and oriented to person, place, and time.  Psychiatric:        Mood and Affect: Mood  normal.        Behavior: Behavior normal.     LABORATORY DATA:  I have reviewed the labs as listed.  CBC Latest Ref Rng & Units 03/27/2020 09/16/2019 03/19/2019  WBC 4.0 - 10.5 K/uL 7.5 9.3 9.8  Hemoglobin 13.0 - 17.0 g/dL 16.3 17.0 16.9  Hematocrit 39 - 52 % 48.5 51.0 51.5  Platelets 150 - 400 K/uL 195 210 189   CMP Latest Ref Rng & Units 03/27/2020 09/16/2019 08/12/2019  Glucose 70 - 99 mg/dL 109(H) 132(H) 84  BUN 6 - 20 mg/dL $Remove'17 18 15  'CPncJdP$ Creatinine 0.61 - 1.24 mg/dL 1.09 1.20 1.24  Sodium 135 - 145 mmol/L 138 139 139  Potassium 3.5 - 5.1 mmol/L 4.0 3.8 4.1  Chloride 98 - 111 mmol/L 104 106 100  CO2 22 - 32 mmol/L $RemoveB'24 24 29  'derOEzBd$ Calcium 8.9 - 10.3 mg/dL 8.9 9.1 9.1  Total Protein 6.5 - 8.1 g/dL 6.7 6.8 6.2  Total Bilirubin 0.3 - 1.2 mg/dL 0.9 0.7 0.7  Alkaline Phos 38 - 126 U/L 96 102 111  AST 15 - 41 U/L $Remo'19 24 25  'BZqdL$ ALT 0 - 44 U/L 21 38 34      Component Value Date/Time   RBC 5.16 03/27/2020 1405   MCV 94.0 03/27/2020 1405   MCH 31.6 03/27/2020 1405   MCHC 33.6 03/27/2020 1405   RDW 12.2 03/27/2020 1405   LYMPHSABS 1.5 03/27/2020 1405   MONOABS 0.6 03/27/2020 1405   EOSABS 0.2 03/27/2020 1405   BASOSABS 0.1 03/27/2020 1405   Lab Results  Component Value Date   LDH 145 03/27/2020   LDH 242 (H) 03/10/2018   LDH 176 03/05/2018    DIAGNOSTIC IMAGING:  I have independently reviewed the scans and discussed with the patient. No results found.   ASSESSMENT:  1.  Eosinophilia: - Elevated absolute eosinophil count of 5800 recorded on 12/04/2017.  Most recent dated absolute eosinophil count was 1400 on 01/06/2018.   -Elevated IgE level on 2 occasions of 850 and 588. -High-resolution CT of the chest did  not show any lung involvement. - FGFR1 and PDGFR beta on peripheral blood was negative.  Strongyloides antibody was negative.  LDH was in the upper limit of normal. - CT scan of the abdomen on 03/20/2018 shows spleen size in the upper limits of normal.  Echocardiogram shows ejection fraction of 55 to 60% with mild thickening of the leaflets.  He had normal tryptase levels and B12 was normal. - PDGFR A and FIP1L1 was negative.  Jak 2 mutation was also negative. - As there was no convincing evidence of primary eosinophilic disorders, I did not recommend against bone marrow aspiration and biopsy.   PLAN:  1.  Eosinophilia: -Reviewed his CBC from 03/27/2020.  White count is 7.5 with normal differential and normal eosinophil count. -No B symptoms or skin rashes or infections. -Recommended follow-up in a year with repeat CBC.  If it continues to be normal, he can be discharged from the clinic.  2.  Moderate persistent asthma: -He reports improvement since Dupixent was started. -He will continue Trelegy, Singulair, Zyrtec, Flonase. -He is currently taking prednisone 5 mg daily. -He is also using CPAP for sleep apnea. -He has follow-up with Dr. Lamonte Sakai.   Orders placed this encounter:  No orders of the defined types were placed in this encounter.    Derek Jack, MD [place] [phone]   I, Milinda Antis, am acting as a scribe for Dr. Sanda Linger.  I, Derek Jack MD, have reviewed the above documentation for accuracy and completeness, and I agree with the above.

## 2020-03-27 NOTE — Addendum Note (Signed)
Addended by: Mickie Bail on: 03/27/2020 03:55 PM   Modules accepted: Orders

## 2020-03-30 LAB — IGE: IgE (Immunoglobulin E), Serum: 47 IU/mL (ref 6–495)

## 2020-04-10 ENCOUNTER — Encounter: Payer: Self-pay | Admitting: Emergency Medicine

## 2020-04-10 ENCOUNTER — Ambulatory Visit: Payer: 59 | Admitting: Emergency Medicine

## 2020-04-10 ENCOUNTER — Other Ambulatory Visit: Payer: Self-pay

## 2020-04-10 DIAGNOSIS — J301 Allergic rhinitis due to pollen: Secondary | ICD-10-CM | POA: Diagnosis not present

## 2020-04-10 DIAGNOSIS — J4550 Severe persistent asthma, uncomplicated: Secondary | ICD-10-CM

## 2020-04-10 NOTE — Assessment & Plan Note (Signed)
Improved symptom control.  He is back to work and doing well.  In fact he has stopped using his Trelegy reliably because he forgets it.  Question whether we may be able to come off corticosteroids.  Adrenal insufficiency was a barrier in the past.  I would like for him to get back on the Trelegy.  We may be able to wean the prednisone.  Alternatively (or in addition) we may be able to change him to ICS/LABA.  The biggest influence on his improvement has been that the Dupixent and we will continue this.  Try to go back to using the Trelegy 1 inhalation once daily every day.  We may decide to adjust this to an alternative at some point going forward.  Rinse and gargle after using. Keep albuterol available use 2 puffs when needed for shortness of breath Continue your Dupixent injections every other week as you have been doing. Continue Singulair 10 mg each evening Continue prednisone 2.5 mg twice a day.  Once you are back on Trelegy reliably, call the office and we can consider trying to wean the prednisone down.  Our goal is to wean off completely if possible. We discussed the COVID-19 vaccine today.  Based on our experience with the vaccine I feel that the benefits for you outweigh any risks.  If you do get the vaccine I would like you to do so where it will be delivered by an RN and where you will be monitored for at least 15 minutes after the injection to ensure no problems or changes in your breathing. Follow with Dr Delton Coombes in 4 months or sooner if you have any problems.

## 2020-04-10 NOTE — Patient Instructions (Signed)
Try to go back to using the Trelegy 1 inhalation once daily every day.  We may decide to adjust this to an alternative at some point going forward.  Rinse and gargle after using. Keep albuterol available use 2 puffs when needed for shortness of breath Continue your Dupixent injections every other week as you have been doing. Continue Singulair 10 mg each evening Continue Zyrtec, Flonase, nasal rinses as you have been doing them. Continue prednisone 2.5 mg twice a day.  Once you are back on Trelegy reliably, call the office and we can consider trying to wean the prednisone down.  Our goal is to wean off completely if possible. We discussed the COVID-19 vaccine today.  Based on our experience with the vaccine I feel that the benefits for you outweigh any risks.  If you do get the vaccine I would like you to do so where it will be delivered by an RN and where you will be monitored for at least 15 minutes after the injection to ensure no problems or changes in your breathing. Follow with Dr Delton Coombes in 4 months or sooner if you have any problems.

## 2020-04-10 NOTE — Assessment & Plan Note (Signed)
Continue Zyrtec, Flonase, nasal rinses as you have been doing them.

## 2020-04-10 NOTE — Progress Notes (Signed)
Subjective:    Patient ID: Jose Ross, male    DOB: [birth year], 49 y.o.   MRN: [id number]  Asthma He complains of cough. There is no shortness of breath or wheezing. Pertinent negatives include no ear pain, fever, headaches, postnasal drip, rhinorrhea, sneezing, sore throat or trouble swallowing. His past medical history is significant for asthma.  Shortness of Breath Pertinent negatives include no ear pain, fever, headaches, leg swelling, rash, rhinorrhea, sore throat, vomiting or wheezing. His past medical history is significant for asthma.    ROV 12/02/19 --follow-up visit for moderate persistent asthma and hypereosinophilic phenotype, difficult to manage and has required high to moderate dose steroids in the past.  This is improved when he started on Dupixent.  Prednisone was down to 2.5 mg twice a day but he was having symptoms so I increased him back to 5 mg twice a day.  I also referred him to allergy to see if he would benefit from immunotherapy.  His maintenance regimen is Trelegy, Singulair, Flonase, Zyrtec, nasal rinses. He feels better, less dyspnea. He is using ventolin about once a day, sometimes more depending on activity. Good compliance with his CPAP.   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  He has had a significant improvement when he was started on Dupixent - every 2 weeks.  At his last visit we continued prednisone 2.5 mg twice a day which is as low as we been able to get him.  He is managed on Trelegy, Singulair, Zyrtec, Flonase, nasal rinses. He reports that he has decreased his Trelegy use - probably only taking 3x a week.  He has albuterol which he uses about once a day. He is having a little UA noise. Has lost weight. Gets a little fatigued and SOB by the end of the work day    Review of Systems    Constitutional: Negative for fever and unexpected weight change.  HENT: Positive for congestion. Negative for dental problem, ear pain, nosebleeds, postnasal drip, rhinorrhea, sinus pressure, sneezing, sore throat and trouble swallowing.   Eyes: Negative for redness and itching.  Respiratory: Positive for cough and chest tightness. Negative for shortness of breath and wheezing.   Cardiovascular: Negative for palpitations and leg swelling.  Gastrointestinal: Negative for nausea and vomiting.  Genitourinary: Negative for dysuria.  Musculoskeletal: Negative for joint swelling.  Skin: Negative for rash.  Allergic/Immunologic: Positive for environmental allergies. Negative for food allergies and immunocompromised state.  Neurological: Negative for headaches.  Hematological: Does not bruise/bleed easily.  Psychiatric/Behavioral: Negative for dysphoric mood. The patient is not nervous/anxious.         Objective:   Physical Exam Vitals:   04/10/20 1620  BP: 120/80  Pulse: 79  Temp: (!) 97.2 F (36.2 C)  TempSrc: Temporal  SpO2: 98%  Weight: 257 lb 12.8 oz (116.9 kg)  Height: 5\' 10"  (1.778 m)   Gen: Pleasant, well-nourished, intermittently clearing throat, normal affect, cushingoid facies  ENT: No lesions,  mouth clear,  oropharynx clear but crowded, no real UA noise today   Lungs: No use of accessory muscles, no wheeze  Cardiovascular: RRR, heart sounds normal, no murmur or gallops, trace pretibial peripheral edema  Musculoskeletal: No deformities, no cyanosis or clubbing  Neuro: alert, non focal  Skin: Warm, no lesions or rash  Assessment & Plan:  Asthma Improved symptom control.  He is back to work and doing well.  In fact he has stopped using his Trelegy reliably because he forgets it.  Question whether we may be able to come off corticosteroids.  Adrenal insufficiency was a barrier in the past.  I would like for him to get back on the Trelegy.  We may be able to wean  the prednisone.  Alternatively (or in addition) we may be able to change him to ICS/LABA.  The biggest influence on his improvement has been that the Dupixent and we will continue this.  Try to go back to using the Trelegy 1 inhalation once daily every day.  We may decide to adjust this to an alternative at some point going forward.  Rinse and gargle after using. Keep albuterol available use 2 puffs when needed for shortness of breath Continue your Dupixent injections every other week as you have been doing. Continue Singulair 10 mg each evening Continue prednisone 2.5 mg twice a day.  Once you are back on Trelegy reliably, call the office and we can consider trying to wean the prednisone down.  Our goal is to wean off completely if possible. We discussed the COVID-19 vaccine today.  Based on our experience with the vaccine I feel that the benefits for you outweigh any risks.  If you do get the vaccine I would like you to do so where it will be delivered by an RN and where you will be monitored for at least 15 minutes after the injection to ensure no problems or changes in your breathing. Follow with Dr Delton Coombes in 4 months or sooner if you have any problems.  Allergic rhinitis Continue Zyrtec, Flonase, nasal rinses as you have been doing them.  Levy Pupa, MD, PhD 04/10/2020, 5:02 PM [facility] [phone] or if no answer [phone]

## 2020-08-04 ENCOUNTER — Telehealth: Payer: Self-pay | Admitting: Emergency Medicine

## 2020-08-04 NOTE — Telephone Encounter (Signed)
Ok to refill the pred  Best option for the ventolin would probably be to write a new script for albuterol HFA, and allow for formulary substitution (not dispense as written)

## 2020-08-04 NOTE — Telephone Encounter (Signed)
Spoke with patient. He verbalized understanding. He stated that the pharmacy was able to process the prescription and his girlfriend was able to pick his inhaler and prednisone from the pharmacy.   I advised him if he has any more problems with the pharmacy to let us know. He verbalized understanding.   Nothing further needed at time of call.

## 2020-08-04 NOTE — Telephone Encounter (Signed)
Called and spoke with pt and he stated that his insurance notified him that they will no longer cover the inhaler ventolin.  They did not give him an alternative that they would cover.  Pt stated that he will also need a refill of the prednisone.  RB please advise. thanks

## 2020-08-24 ENCOUNTER — Other Ambulatory Visit: Payer: Self-pay | Admitting: Emergency Medicine

## 2020-12-07 ENCOUNTER — Telehealth: Payer: Self-pay

## 2020-12-07 NOTE — Telephone Encounter (Signed)
Submitted a Prior Authorization request to [facility] for DUPIXENT via CoverMyMeds. Will update once we receive a response.   Key: [id number]

## 2020-12-08 ENCOUNTER — Other Ambulatory Visit (HOSPITAL_COMMUNITY): Payer: Self-pay

## 2020-12-08 NOTE — Telephone Encounter (Signed)
Received notification from [facility] regarding a prior authorization for DUPIXENT. Authorization has been APPROVED from 12/07/2020 to 12/07/2021.   Authorization # [id number] Phone # [phone]  Test claim reveals that patient is required to fill through [facility]. Phone# [phone]

## 2020-12-14 ENCOUNTER — Other Ambulatory Visit (HOSPITAL_COMMUNITY): Payer: Self-pay

## 2020-12-21 ENCOUNTER — Telehealth: Payer: Self-pay | Admitting: Emergency Medicine

## 2020-12-21 NOTE — Telephone Encounter (Signed)
Primary Pulmonologist: Jose Ross Last office visit and with whom: 04/10/20 Jose Ross What do we see them for (pulmonary problems): Asthma Last OV assessment/plan:   ?o Known Allergies   There is no immunization history on file for this patient.   Assessment & Plan:  Asthma Improved symptom control.  He is back to work and doing well.  In fact he has stopped using his Trelegy reliably because he forgets it.  Question whether we may be able to come off corticosteroids.  Adrenal insufficiency was a barrier in the past.  I would like for him to get back on the Trelegy.  We may be able to wean the prednisone.  Alternatively (or in addition) we may be able to change him to ICS/LABA.  The biggest influence on his improvement has been that the Dupixent and we will continue this.   Try to go back to using the Trelegy 1 inhalation once daily every day.  We may decide to adjust this to an alternative at some point going forward.  Rinse and gargle after using. Keep albuterol available use 2 puffs when needed for shortness of breath Continue your Dupixent injections every other week as you have been doing. Continue Singulair 10 mg each evening Continue prednisone 2.5 mg twice a day.  Once you are back on Trelegy reliably, call the office and we can consider trying to wean the prednisone down.  Our goal is to wean off completely if possible. We discussed the COVID-19 vaccine today.  Based on our experience with the vaccine I feel that the benefits for you outweigh any risks.  If you do get the vaccine I would like you to do so where it will be delivered by an RN and where you will be monitored for at least 15 minutes after the injection to ensure no problems or changes in your breathing. Follow with Dr Delton Coombes in 4 months or sooner if you have any problems.   Allergic rhinitis Continue Zyrtec, Flonase, nasal rinses as you have been doing them.   Levy Pupa, MD, PhD 04/10/2020, 5:02 PM [facility] [phone] or if no answer [phone]         Assessment & Plan Note by Leslye Peer, MD at 04/10/2020 5:02 PM  Author: Leslye Peer, MD Author Type: Physician Filed: 04/10/2020  5:02 PM  Note Status: Written Cosign: Cosign Not Required Encounter Date: 04/10/2020  Problem: Allergic rhinitis  Editor: Leslye Peer, MD (Physician)               Continue Zyrtec, Flonase, nasal rinses as you have been doing them.         Assessment & Plan Note by Leslye Peer, MD at 04/10/2020 5:01 PM  Author: Leslye Peer, MD Author Type: Physician Filed: 04/10/2020  5:02 PM  Note Status: Written Cosign: Cosign Not Required Encounter Date: 04/10/2020  Problem: Asthma  Editor: Leslye Peer, MD (Physician)               Improved symptom control.  He is back to work and doing well.  In fact he has stopped using his Trelegy reliably because he forgets it.  Question whether we may be able to come off corticosteroids.  Adrenal insufficiency was a barrier in the past.  I would like for him to get back on the Trelegy.  We may be able to wean the prednisone.  Alternatively (or in addition) we may be able to change him  to ICS/LABA.  The biggest influence on his improvement has been that the Dupixent and we will continue this.   Try to go back to using the Trelegy 1 inhalation once daily every day.  We may decide to adjust this to an alternative at some point going forward.  Rinse and gargle after using. Keep albuterol available use 2 puffs when needed for shortness of breath Continue your Dupixent injections every other week as you have been doing. Continue Singulair 10 mg each evening Continue prednisone 2.5 mg twice a day.  Once you are back on Trelegy reliably, call the office and we can consider trying to wean the prednisone down.  Our goal is to wean off completely if possible. We discussed the COVID-19 vaccine today.  Based on our experience with the vaccine I feel that the benefits  for you outweigh any risks.  If you do get the vaccine I would like you to do so where it will be delivered by an RN and where you will be monitored for at least 15 minutes after the injection to ensure no problems or changes in your breathing. Follow with Dr Delton Coombes in 4 months or sooner if you have any problems.         Patient Instructions by Leslye Peer, MD at 04/10/2020 4:30 PM  Author: Leslye Peer, MD Author Type: Physician Filed: 04/10/2020  5:01 PM  Note Status: Signed Cosign: Cosign Not Required Encounter Date: 04/10/2020  Editor: Leslye Peer, MD (Physician)               Try to go back to using the Trelegy 1 inhalation once daily every day.  We may decide to adjust this to an alternative at some point going forward.  Rinse and gargle after using. Keep albuterol available use 2 puffs when needed for shortness of breath Continue your Dupixent injections every other week as you have been doing. Continue Singulair 10 mg each evening Continue Zyrtec, Flonase, nasal rinses as you have been doing them. Continue prednisone 2.5 mg twice a day.  Once you are back on Trelegy reliably, call the office and we can consider trying to wean the prednisone down.  Our goal is to wean off completely if possible. We discussed the COVID-19 vaccine today.  Based on our experience with the vaccine I feel that the benefits for you outweigh any risks.  If you do get the vaccine I would like you to do so where it will be delivered by an RN and where you will be monitored for at least 15 minutes after the injection to ensure no problems or changes in your breathing. Follow with Dr Delton Coombes in 4 months or sooner if you have any problems.           Instructions  Try to go back to using the Trelegy 1 inhalation once daily every day.  We may decide to adjust this to an alternative at some point going forward.  Rinse and gargle after using. Keep albuterol available use 2 puffs when needed for shortness  of breath Continue your Dupixent injections every other week as you have been doing. Continue Singulair 10 mg each evening Continue Zyrtec, Flonase, nasal rinses as you have been doing them. Continue prednisone 2.5 mg twice a day.  Once you are back on Trelegy reliably, call the office and we can consider trying to wean the prednisone down.  Our goal is to wean off completely if possible. We discussed the  COVID-19 vaccine today.  Based on our experience with the vaccine I feel that the benefits for you outweigh any risks.  If you do get the vaccine I would like you to do so where it will be delivered by an RN and where you will be monitored for at least 15 minutes after the injection to ensure no problems or changes in your breathing. Follow with Dr Delton Coombes in 4 months or sooner if you have any problems.      Reason for call: Reaction to dupixent injection.  He took his last dupixent injection on 5/19 and has had eye lids that are puffy, red and itchy.  He also states in the corner of his eye where the tear ducts are located it is dark like he has a black eye and in the morning he has crusty drainage at that site as well.  He denies any pain, visual problems or symptoms to his eye.  He is having no reaction at the injection site, no sob and no swelling at the injection site.  He is wondering if he needs to continue the injections. Dr. Delton Coombes, please advise.  (examples of things to ask: : When did symptoms start? Fever? Cough? Productive? Color to sputum? More sputum than usual? Wheezing? Have you needed increased oxygen? Are you taking your respiratory medications? What over the counter measures have you tried?)

## 2020-12-21 NOTE — Telephone Encounter (Signed)
Patient needs to be seen by APP or RB.  No more Dupixent until we evaluate him

## 2020-12-21 NOTE — Telephone Encounter (Signed)
Tried again to see if I could reach pt and still unable to reach. Will try to call pt again 6/10.

## 2020-12-21 NOTE — Telephone Encounter (Signed)
Attempted to call pt but unable to reach. Left message for him to return call.  When pt returns call, we need to get him scheduled for appt with either Dr. Delton Coombes or APP ASAP.

## 2020-12-22 NOTE — Telephone Encounter (Signed)
Spoke with the pt and made aware of response per RB. Pt verbalized understanding. He was already scheduled for an appt with TP on Monday 12/25/20 at 4 pm.

## 2020-12-25 ENCOUNTER — Other Ambulatory Visit: Payer: Self-pay

## 2020-12-25 ENCOUNTER — Encounter: Payer: Self-pay | Admitting: Adult Health

## 2020-12-25 ENCOUNTER — Ambulatory Visit (INDEPENDENT_AMBULATORY_CARE_PROVIDER_SITE_OTHER): Payer: 59 | Admitting: Adult Health

## 2020-12-25 DIAGNOSIS — J4540 Moderate persistent asthma, uncomplicated: Secondary | ICD-10-CM | POA: Diagnosis not present

## 2020-12-25 DIAGNOSIS — D7219 Other eosinophilia: Secondary | ICD-10-CM | POA: Diagnosis not present

## 2020-12-25 DIAGNOSIS — H109 Unspecified conjunctivitis: Secondary | ICD-10-CM | POA: Diagnosis not present

## 2020-12-25 DIAGNOSIS — J309 Allergic rhinitis, unspecified: Secondary | ICD-10-CM | POA: Diagnosis not present

## 2020-12-25 MED ORDER — BACITRACIN-POLYMYXIN B 500-10000 UNIT/GM OP OINT: 1.0000 "application " | TOPICAL_OINTMENT | Freq: Two times a day (BID) | OPHTHALMIC | 0 refills | Status: AC

## 2020-12-25 NOTE — Assessment & Plan Note (Signed)
Bilateral eye irritation questionable etiology possible underlying conjunctivitis.  Unclear if this is related to Dupixent.  Patient is to use cool compresses as may have an allergic component. Will treat 5 days of ophthalmic Polysporin. Patient is advised to get eye appointment this week for further evaluation. If symptoms persist.  To consider if this is related to Dupixent or not

## 2020-12-25 NOTE — Assessment & Plan Note (Signed)
Moderate to severe persistent asthma with much improvement on Dupixent. Clinically appears stable. Questionable side effect from Dupixent.-We will hold x1 dose that hopefully will be able to resume.  Plan  Patient Instructions  Need to see eye doctor this week.  Cool compresses to eye Twice daily   Continue on Trelegy 1 inhalation once daily every day.   Keep albuterol available use 2 puffs when needed for shortness of breath Hold Dupixent for 1 dose.  Restart Dupixent injections every other week as you have been doing.-beginning next week.  Continue Singulair 10 mg each evening Continue Zyrtec, Flonase, nasal rinses as you have been doing them. Continue prednisone 2.5 mg twice a day.   Follow up with Dr. Delton Coombes  in 6 weeks and As needed

## 2020-12-25 NOTE — Progress Notes (Signed)
$'@Patient'E$  ID: Jose Ross, male    DOB: [birth year], 50 y.o.   MRN: [id number]  Chief Complaint  Patient presents with   Follow-up    Referring provider: No ref. provider found  HPI: 50 year old male followed for moderate persistent asthma with allergic phenotype, hypereosinophilia  TEST/EVENTS :  23/19 Exhaled nitric oxide testing today was very high at 152 ppb. 11/2017 >Lab work showed very high eosinophil count.  With eosinophils at 5800.  IgE very high at 850. ANCA neg , Aspergillus panel neg   Elevated absolute eosinophil count of 5800 recorded on 12/04/2017.  absolute eosinophil count was 1400 on 01/06/2018.   -Elevated IgE level on 2 occasions of 850 and 588. -  high-resolution CT scan of the chest which did not reveal any pulmonary involvement. - FGFR1 and PDGFR beta on peripheral blood was negative.  Strongyloides antibody was negative.  LDH was in the upper limit of normal. - CT scan of the abdomen on 03/20/2018 shows spleen size in the upper limits of normal.  Echocardiogram shows ejection fraction of 55 to 60% with mild thickening of the leaflets.  He had normal tryptase levels and B12 was normal. - PDGFR A and FIP1L1 was negative.  Jak 2 mutation was also negative. -GI referral GI for possible eosinophilic esophagitis.  His Nexium was increased to twice a day.  They thought doing EGD and biopsy will be low yield given patient is on prednisone.  Hematology referral Eosinophilia -workup as above  , eosinophils improved so deferred Bone marrow biopsy ENT referral 2019 [facility] referral 2019 -Dr. Rodman Key Allergy referral 08/2018   12/25/2020 Follow up : Moderate persistent asthma, hypereosinophilia Patient returns for a follow-up visit.  Last seen September 2021. Patient remains on Trelegy inhaler daily.  Uses albuterol as needed.  Is on Dupixent every 2 weeks.  He also is on Singulair and Zyrtec and Flonase daily.  He is on chronic steroids with prednisone 2.5 mg  twice daily. Patient says overall his breathing is doing better.  Since beginning [place] he does notice a substantial improvement in his asthma control.  He does have some shortness of breath with activities and decreased activity tolerance.  Has some occasional wheezing and cough worse in the morning.  But overall does much better since being on Dupixent. He does complain over the last 2 months that he has had episodes where he gets irritation to his eyes shortly after Dupixent.  After the last 2 injections he noticed that he has had some eye redness and did have some eye discharge both eyes.  Patient denies any visual changes severe pain. Patient says his eye redness has improved but he did skip his Dupixent shot last week. He gets yearly eye exams and is due right now. He does wear eyeglasses.  No Known Allergies   There is no immunization history on file for this patient.  Past Medical History:  Diagnosis Date   Asthma    Eosinophilia    Pulmonary nodules     Tobacco History: Social History   Tobacco Use  Smoking Status Former   Packs/day: 0.25   Years: 10.00   Pack years: 2.50   Types: Cigarettes   Quit date: 2000   Years since quitting: 22.4  Smokeless Tobacco Never   Counseling given: Not Answered   Outpatient Medications Prior to Visit  Medication Sig Dispense Refill   albuterol (PROVENTIL) (2.5 MG/3ML) 0.083% nebulizer solution Take 3 mLs (2.5 mg total) by nebulization every  4 (four) hours as needed for wheezing or shortness of breath. 150 mL 5   albuterol (VENTOLIN HFA) 108 (90 Base) MCG/ACT inhaler Inhale 2 puffs into the lungs every 6 (six) hours as needed for wheezing or shortness of breath. 18 g 5   cetirizine (ZYRTEC) 10 MG tablet Take 10 mg by mouth daily.     EPINEPHrine 0.3 mg/0.3 mL IJ SOAJ injection Inject one dose intramuscularly for allergic reaction. May repeat one dose if needed after 5-15 minutes. Proceed to the ER  11   fluticasone (FLONASE) 50  MCG/ACT nasal spray Place 2 sprays into both nostrils daily.     Fluticasone-Umeclidin-Vilant 100-62.5-25 MCG/INH AEPB Inhale 1 puff into the lungs daily.      furosemide (LASIX) 20 MG tablet Take 1 tablet (20 mg total) by mouth daily. 4 tablet 0   hydrocortisone (CORTEF) 10 MG tablet Take 1 and 1/2 tablets every morning and 1 tablet every evening 75 tablet 6   montelukast (SINGULAIR) 10 MG tablet Take 1 tablet (10 mg total) by mouth at bedtime. 30 tablet 5   omeprazole (PRILOSEC) 20 MG capsule Take 1 capsule (20 mg total) by mouth 2 (two) times daily before a meal. (Patient taking differently: Take 20 mg by mouth daily.) 60 capsule 1   predniSONE (DELTASONE) 5 MG tablet 5 mg in the am and 2.5 mg in the pm (Patient taking differently: 2.5 mg in the am and 2.5 mg in the pm) 60 tablet 2   Dupilumab (DUPIXENT) 300 MG/2ML SOPN Inject 300 mg into the skin every 14 (fourteen) days. (Patient not taking: Reported on 12/25/2020) 2 pen 11   No facility-administered medications prior to visit.     Review of Systems:   Constitutional:   No  weight loss, night sweats,  Fevers, chills, fatigue, or  lassitude.  HEENT:   No headaches,  Difficulty swallowing,  Tooth/dental problems, or  Sore throat,                No sneezing, itching, ear ache,  +nasal congestion, post nasal drip,   CV:  No chest pain,  Orthopnea, PND, swelling in lower extremities, anasarca, dizziness, palpitations, syncope.   GI  No heartburn, indigestion, abdominal pain, nausea, vomiting, diarrhea, change in bowel habits, loss of appetite, bloody stools.   Resp:   No chest wall deformity  Skin: no rash or lesions.  GU: no dysuria, change in color of urine, no urgency or frequency.  No flank pain, no hematuria   MS:  No joint pain or swelling.  No decreased range of motion.  No back pain.    Physical Exam  BP 126/82   Pulse 76   Temp (!) 97.4 F (36.3 C) (Temporal)   Ht $R'5\' 10"'vL$  (1.778 m)   Wt 255 lb 2 oz (115.7 kg)   SpO2  97%   BMI 36.61 kg/m   GEN: A/Ox3; pleasant , NAD, well nourished    HEENT:  [place]/AT,    NOSE-clear, THROAT-clear, no lesions, no postnasal drip or exudate noted.  Conjunctive a noninjected, no discharge noted.  Upper eyelids without redness.  NECK:  Supple w/ fair ROM; no JVD; normal carotid impulses w/o bruits; no thyromegaly or nodules palpated; no lymphadenopathy.    RESP  Clear  P & A; w/o, wheezes/ rales/ or rhonchi. no accessory muscle use, no dullness to percussion  CARD:  RRR, no m/r/g, no peripheral edema, pulses intact, no cyanosis or clubbing.  GI:   Soft &  nt; nml bowel sounds; no organomegaly or masses detected.   Musco: Warm bil, no deformities or joint swelling noted.   Neuro: alert, no focal deficits noted.    Skin: Warm, no lesions or rashes     BNP No results found for: BNP  ProBNP    Component Value Date/Time   PROBNP 14.0 12/04/2017 1548    Imaging: No results found.    PFT Results Latest Ref Rng & Units 12/29/2017  FVC-Pre L 4.52  FVC-Predicted Pre % 86  FVC-Post L 4.93  FVC-Predicted Post % 94  Pre FEV1/FVC % % 74  Post FEV1/FCV % % 77  FEV1-Pre L 3.34  FEV1-Predicted Pre % 81  FEV1-Post L 3.78  DLCO uncorrected ml/min/mmHg 31.40  DLCO UNC% % 95  DLCO corrected ml/min/mmHg 29.91  DLCO COR %Predicted % 90  DLVA Predicted % 106  TLC L 6.39  TLC % Predicted % 90  RV % Predicted % 92    Lab Results  Component Value Date   NITRICOXIDE 17 12/12/2017        Assessment & Plan:   Asthma Moderate to severe persistent asthma with much improvement on Dupixent. Clinically appears stable. Questionable side effect from Dupixent.-We will hold x1 dose that hopefully will be able to resume.  Plan  Patient Instructions  Need to see eye doctor this week.  Cool compresses to eye Twice daily   Continue on Trelegy 1 inhalation once daily every day.   Keep albuterol available use 2 puffs when needed for shortness of breath Hold Dupixent  for 1 dose.  Restart Dupixent injections every other week as you have been doing.-beginning next week.  Continue Singulair 10 mg each evening Continue Zyrtec, Flonase, nasal rinses as you have been doing them. Continue prednisone 2.5 mg twice a day.   Follow up with Dr. Lamonte Sakai  in 6 weeks and As needed         Allergic rhinitis Continue on trigger prevention.   Continue on maintenance regimen.  Eosinophilia Continue on Dupixent.-Currently dose is on hold due to possible side effect.  Will resume next week as able.  Conjunctivitis Bilateral eye irritation questionable etiology possible underlying conjunctivitis.  Unclear if this is related to [place].  Patient is to use cool compresses as may have an allergic component. Will treat 5 days of ophthalmic Polysporin. Patient is advised to get eye appointment this week for further evaluation. If symptoms persist.  To consider if this is related to [place] or not    I spent   37 minutes dedicated to the care of this patient on the date of this encounter to include pre-visit review of records, face-to-face time with the patient discussing conditions above, post visit ordering of testing, clinical documentation with the electronic health record, making appropriate referrals as documented, and communicating necessary findings to members of the patients care team.   Rexene Edison, NP 12/25/2020

## 2020-12-25 NOTE — Patient Instructions (Addendum)
Need to see eye doctor this week.  Cool compresses to eye Twice daily   Continue on Trelegy 1 inhalation once daily every day.   Keep albuterol available use 2 puffs when needed for shortness of breath Hold Dupixent for 1 dose.  Restart Dupixent injections every other week as you have been doing.-beginning next week.  Continue Singulair 10 mg each evening Continue Zyrtec, Flonase, nasal rinses as you have been doing them. Continue prednisone 2.5 mg twice a day.   Follow up with Dr. Delton Coombes  in 6 weeks and As needed

## 2020-12-25 NOTE — Assessment & Plan Note (Signed)
Continue on trigger prevention.   Continue on maintenance regimen.

## 2020-12-25 NOTE — Assessment & Plan Note (Signed)
Continue on Dupixent.-Currently dose is on hold due to possible side effect.  Will resume next week as able.

## 2020-12-29 ENCOUNTER — Telehealth: Payer: Self-pay | Admitting: Adult Health

## 2020-12-29 MED ORDER — PREDNISONE 5 MG PO TABS: ORAL_TABLET | ORAL | 2 refills | Status: DC

## 2020-12-29 MED ORDER — CIPROFLOXACIN HCL 0.3 % OP SOLN: OPHTHALMIC | 0 refills | Status: DC

## 2020-12-29 MED ORDER — PREDNISONE 20 MG PO TABS: 20.0000 mg | ORAL_TABLET | Freq: Every day | ORAL | 0 refills | Status: DC

## 2020-12-29 NOTE — Telephone Encounter (Signed)
Called and spoke with Patient.  Patient stated he saw Tammy, NP 12/25/20, for possible Dupixent reaction.  Patient stated his eyes are worse today then they were when he saw Tammy.  Patient stated his eyes are swollen, red, crusty, and very itchy.  Patient stated he was prescribed salve by Tammy, but he feels it may be making his eyes worse. Patient requested any prescriptions to go to [facility].  Message routed to Dr. Delton Coombes    12/25/20- AVS per Babette Relic, NP  Instructions  Need to see eye doctor this week. Cool compresses to eye Twice daily   Continue on Trelegy 1 inhalation once daily every day.   Keep albuterol available use 2 puffs when needed for shortness of breath Hold Dupixent for 1 dose. Restart Dupixent injections every other week as you have been doing.-beginning next week.  Continue Singulair 10 mg each evening Continue Zyrtec, Flonase, nasal rinses as you have been doing them. Continue prednisone 2.5 mg twice a day.   Follow up with Dr. Delton Coombes  in 6 weeks and As needed

## 2020-12-29 NOTE — Telephone Encounter (Signed)
Pt was seen on Monday by TP. Eyes are still swollen,draining and crusty from dupixent shot, and the drops TP gave him have not helped. Believes the drops may be making it worse. Wonders if antiobiotic would help. Wants refill of prednisone as well Pharmacy is [facility]. Please adivse.

## 2020-12-29 NOTE — Telephone Encounter (Signed)
Called and spoke with patient. He stated that he is established with an ophthalmologist and will call their office today to see if he can get an appointment. He verbalized understanding of the eye drops and increased prednisone. Will go ahead and call in the prednisone for him.   Nothing further needed at time of call.

## 2020-12-29 NOTE — Telephone Encounter (Signed)
Lm for patient.  

## 2020-12-29 NOTE — Telephone Encounter (Signed)
He needs to see ophthalmology in addition to whatever changes we make >> ASAP. Urgent care ophthalmology if necessary Continue his current allergy meds  Hold dupixent dose as directed by TP Have him take prednisone 20mg  qd x 5 days and then go back to his usual dose.  Order Cipro ophthalmic 0.3%, take 2 gtt each eye every 5 hours while awake for 5 days.

## 2021-01-10 ENCOUNTER — Other Ambulatory Visit: Payer: Self-pay | Admitting: Emergency Medicine

## 2021-01-16 ENCOUNTER — Telehealth: Payer: Self-pay | Admitting: Emergency Medicine

## 2021-01-16 MED ORDER — DUPIXENT 300 MG/2ML ~~LOC~~ SOAJ: 300.0000 mg | SUBCUTANEOUS | 6 refills | Status: DC

## 2021-01-16 NOTE — Telephone Encounter (Signed)
Rx refill was done.

## 2021-02-22 ENCOUNTER — Encounter: Payer: Self-pay | Admitting: Emergency Medicine

## 2021-02-22 ENCOUNTER — Other Ambulatory Visit: Payer: Self-pay

## 2021-02-22 ENCOUNTER — Ambulatory Visit: Payer: 59 | Admitting: Emergency Medicine

## 2021-02-22 VITALS — BP 138/90 | HR 75 | Temp 98.4°F | Ht 70.0 in | Wt 253.4 lb

## 2021-02-22 DIAGNOSIS — G4733 Obstructive sleep apnea (adult) (pediatric): Secondary | ICD-10-CM | POA: Diagnosis not present

## 2021-02-22 DIAGNOSIS — J309 Allergic rhinitis, unspecified: Secondary | ICD-10-CM

## 2021-02-22 DIAGNOSIS — L309 Dermatitis, unspecified: Secondary | ICD-10-CM

## 2021-02-22 DIAGNOSIS — J4540 Moderate persistent asthma, uncomplicated: Secondary | ICD-10-CM

## 2021-02-22 DIAGNOSIS — D7219 Other eosinophilia: Secondary | ICD-10-CM

## 2021-02-22 MED ORDER — BETAMETHASONE DIPROPIONATE AUG 0.05 % EX CREA: TOPICAL_CREAM | Freq: Two times a day (BID) | CUTANEOUS | 0 refills | Status: DC

## 2021-02-22 NOTE — Patient Instructions (Addendum)
Try decreasing your Prednisone to 2.5 mg every other day.  Pay close attention to whether you develop any weakness, lethargy, other new symptoms as you come down on the prednisone. Keep your albuterol available to use 2 puffs when needed for shortness of breath, chest tightness, wheezing. We will not restart Trelegy for now. We will add an IgE level to the lab work that is planned for next month with Dr. Ellin Saba. Try using betamethasone cream twice a day to rash. Do not cover with a dressing.  Follow with Dr Delton Coombes in 1 month

## 2021-02-22 NOTE — Assessment & Plan Note (Signed)
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  He has had an amazing response to Dupixent with improvement in his asthma, significant ability to wean his prednisone, improvement in his eosinophil count and IgE.  He then had side effects from the Dupixent in May and had to stop it.  He has not been on it since 11/30/2020.  He has maintained great control of his asthma but has developed a raised maculopapular erythematous rash on both hands.  He is to follow with Dr. Ellin Saba next month.  I will add an IgE onto his planned CBC with differential.  I will try to treat his dermatitis with topical steroids.  I would like to try to continue to wean his prednisone, goal off although he had adrenal insufficiency last time he was off completely.  We will change his prednisone to 2.5 mg every other day.

## 2021-02-22 NOTE — Assessment & Plan Note (Signed)
He is off Trelegy.  Plan to continue to hold for now.  Using albuterol as needed.  Goal will be to come off prednisone as above.  We will have to wean slowly.  Certainly there is concern that his asthma may rebound or worsen since Dupixent is now out of his system.  Continue to follow closely

## 2021-02-22 NOTE — Assessment & Plan Note (Signed)
Encouraged good CPAP compliance

## 2021-02-22 NOTE — Assessment & Plan Note (Signed)
Continue Singulair, Zyrtec, Flonase 

## 2021-02-22 NOTE — Progress Notes (Signed)
Subjective:    Patient ID: Jose Ross, male    DOB: October 03, 1970, 50 y.o.   MRN: [id number]  Asthma He complains of cough. There is no shortness of breath or wheezing. Pertinent negatives include no ear pain, fever, headaches, postnasal drip, rhinorrhea, sneezing, sore throat or trouble swallowing. His past medical history is significant for asthma.  Shortness of Breath Pertinent negatives include no ear pain, fever, headaches, leg swelling, rash, rhinorrhea, sore throat, vomiting or wheezing. His past medical history is significant for asthma.    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  He has had a significant improvement when he was started on Dupixent - every 2 weeks.  At his last visit we continued prednisone 2.5 mg twice a day which is as low as we been able to get him.  He is managed on Trelegy, Singulair, Zyrtec, Flonase, nasal rinses. He reports that he has decreased his Trelegy use - probably only taking 3x a week.  He has albuterol which he uses about once a day. He is having a little UA noise. Has lost weight. Gets a little fatigued and SOB by the end of the work day  ROV 02/22/21 --Jose Ross is a 50 year old gentleman with hypereosinophilia and moderate persistent asthma.  His obstructive lung disease, coughing allergic symptoms have been difficult to treat but responded well to initiation of Dupixent.  He is noticing that has been developing scleral injection, a periorbital edema and erythema, pruritus around the time he takes his Dupixent.  His last Dupixent injection had been 11/30/2020. He is off trelegy for the last 3 weeks, using albuterol rarely. Feels very good from a resp standpoint. On Pred 2.5mg . he has a erythematous papular rash on dorsum of both of his hands. Itches with hot water.  He saw ophthalmology and dx with blepharitis, no intervention to make.  Remains on CPAP, compliance a  little spotty    Review of Systems  Constitutional:  Negative for fever and unexpected weight change.  HENT:  Positive for congestion. Negative for dental problem, ear pain, nosebleeds, postnasal drip, rhinorrhea, sinus pressure, sneezing, sore throat and trouble swallowing.   Eyes:  Negative for redness and itching.  Respiratory:  Positive for cough and chest tightness. Negative for shortness of breath and wheezing.   Cardiovascular:  Negative for palpitations and leg swelling.  Gastrointestinal:  Negative for nausea and vomiting.  Genitourinary:  Negative for dysuria.  Musculoskeletal:  Negative for joint swelling.  Skin:  Negative for rash.  Allergic/Immunologic: Positive for environmental allergies. Negative for food allergies and immunocompromised state.  Neurological:  Negative for headaches.  Hematological:  Does not bruise/bleed easily.  Psychiatric/Behavioral:  Negative for dysphoric mood. The patient is not nervous/anxious.        Objective:   Physical Exam Vitals:   02/22/21 1630  BP: 138/90  Pulse: 75  Temp: 98.4 F (36.9 C)  TempSrc: Oral  SpO2: 99%  Weight: 253 lb 6.4 oz (114.9 kg)  Height: 5\' 10"  (1.778 m)   Gen: Pleasant, well-nourished, intermittently clearing throat, normal affect, cushingoid facies  ENT: No lesions,  mouth clear,  oropharynx clear but crowded, no UA noise today   Lungs: No use of accessory muscles, no wheeze  Cardiovascular: RRR, heart sounds normal, no murmur or gallops, trace pretibial peripheral edema  Musculoskeletal: No deformities, no cyanosis or clubbing  Neuro: alert, non focal  Skin: Warm, no lesions or rash  Assessment & Plan:  Eosinophilia 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  He has had an amazing response to Dupixent with improvement in his asthma, significant ability to wean his prednisone, improvement in his eosinophil count and IgE.  He then had side effects  from the Dupixent in May and had to stop it.  He has not been on it since 11/30/2020.  He has maintained great control of his asthma but has developed a raised maculopapular erythematous rash on both hands.  He is to follow with Dr. Ellin Saba next month.  I will add an IgE onto his planned CBC with differential.  I will try to treat his dermatitis with topical steroids.  I would like to try to continue to wean his prednisone, goal off although he had adrenal insufficiency last time he was off completely.  We will change his prednisone to 2.5 mg every other day.  Obstructive sleep apnea Encouraged good CPAP compliance  Allergic rhinitis Continue Singulair, Zyrtec, Flonase  Asthma He is off Trelegy.  Plan to continue to hold for now.  Using albuterol as needed.  Goal will be to come off prednisone as above.  We will have to wean slowly.  Certainly there is concern that his asthma may rebound or worsen since Dupixent is now out of his system.  Continue to follow closely  Time spent 45 minutes  Levy Pupa, MD, PhD 02/22/2021, 5:31 PM [facility] [phone] or if no answer [phone]

## 2021-03-26 NOTE — Progress Notes (Signed)
Next  [address] [place], [place] [address]   CLINIC:  Medical Oncology/Hematology  PCP:  Patient, No Pcp Per (Inactive) None  None  REASON FOR VISIT:  Follow-up for eosinophilia  PRIOR THERAPY: none  CURRENT THERAPY: surveillance  INTERVAL HISTORY:  Mr. Jose Ross, a 50 y.o. male, returns for routine follow-up for his eosinophilia. Jose Ross was last seen on 03/27/2020.  Today he reports feeling good. He denies SOB, fevers, night sweats, wheezing, and weight loss. He reports he has a rash on his hands starting 1 month ago for which he is apply betamethasone cream BID which has caused the rash to improve. He is taking 2.5 mg Prednisone BID.  REVIEW OF SYSTEMS:  Review of Systems  Constitutional:  Negative for appetite change, fatigue, fever and unexpected weight change.  Respiratory:  Negative for shortness of breath and wheezing.   Skin:  Positive for itching and rash (hands).  All other systems reviewed and are negative.  PAST MEDICAL/SURGICAL HISTORY:  Past Medical History:  Diagnosis Date   Asthma    Eosinophilia    Pulmonary nodules    History reviewed. No pertinent surgical history.  SOCIAL HISTORY:  Social History   Socioeconomic History   Marital status: Single    Spouse name: Not on file   Number of children: 2   Years of education: Not on file   Highest education level: Not on file  Occupational History   Not on file  Tobacco Use   Smoking status: Former    Packs/day: 0.25    Years: 10.00    Pack years: 2.50    Types: Cigarettes    Start date: 12    Quit date: 2000    Years since quitting: 22.7   Smokeless tobacco: Never  Vaping Use   Vaping Use: Never used  Substance and Sexual Activity   Alcohol use: Yes    Comment: 4-5 drinks a week   Drug use: Not Currently   Sexual activity: Not on file  Other Topics Concern   Not on file  Social History Narrative   Not on file   Social Determinants of Health    Financial Resource Strain: Not on file  Food Insecurity: Not on file  Transportation Needs: Not on file  Physical Activity: Not on file  Stress: Not on file  Social Connections: Not on file  Intimate Partner Violence: Not on file    FAMILY HISTORY:  Family History  Problem Relation Age of Onset   Asthma Mother    Skin cancer Mother     CURRENT MEDICATIONS:  Current Outpatient Medications  Medication Sig Dispense Refill   albuterol (PROVENTIL) (2.5 MG/3ML) 0.083% nebulizer solution Take 3 mLs (2.5 mg total) by nebulization every 4 (four) hours as needed for wheezing or shortness of breath. 150 mL 5   albuterol (VENTOLIN HFA) 108 (90 Base) MCG/ACT inhaler Inhale 2 puffs into the lungs every 6 (six) hours as needed for wheezing or shortness of breath. 18 g 5   augmented betamethasone dipropionate (DIPROLENE AF) 0.05 % cream Apply topically 2 (two) times daily. 30 g 0   EPINEPHrine 0.3 mg/0.3 mL IJ SOAJ injection Inject one dose intramuscularly for allergic reaction. May repeat one dose if needed after 5-15 minutes. Proceed to the ER  11   omeprazole (PRILOSEC) 20 MG capsule Take 1 capsule (20 mg total) by mouth 2 (two) times daily before a meal. (Patient taking differently: Take 20 mg by mouth daily.)  60 capsule 1   predniSONE (DELTASONE) 5 MG tablet 2.5 mg in the am and 2.5 mg in the pm 60 tablet 2   No current facility-administered medications for this visit.    ALLERGIES:  No Known Allergies  PHYSICAL EXAM:  Performance status (ECOG): 0 - Asymptomatic  Vitals:   03/27/21 1252  BP: 132/87  Pulse: 73  Resp: 18  Temp: (!) 96.9 F (36.1 C)  SpO2: 99%   Wt Readings from Last 3 Encounters:  03/27/21 260 lb 12.8 oz (118.3 kg)  02/22/21 253 lb 6.4 oz (114.9 kg)  12/25/20 255 lb 2 oz (115.7 kg)   Physical Exam Vitals reviewed.  Constitutional:      Appearance: Normal appearance.  Cardiovascular:     Rate and Rhythm: Normal rate and regular rhythm.     Pulses: Normal  pulses.     Heart sounds: Normal heart sounds.  Pulmonary:     Effort: Pulmonary effort is normal.     Breath sounds: Normal breath sounds.  Abdominal:     Palpations: Abdomen is soft. There is no hepatomegaly, splenomegaly or mass.     Tenderness: There is no abdominal tenderness.  Musculoskeletal:     Right lower leg: No edema.     Left lower leg: No edema.  Neurological:     General: No focal deficit present.     Mental Status: He is alert and oriented to person, place, and time.  Psychiatric:        Mood and Affect: Mood normal.        Behavior: Behavior normal.    LABORATORY DATA:  I have reviewed the labs as listed.  CBC Latest Ref Rng & Units 03/27/2021 03/27/2020 09/16/2019  WBC 4.0 - 10.5 K/uL 6.8 7.5 9.3  Hemoglobin 13.0 - 17.0 g/dL 16.9 16.3 17.0  Hematocrit 39.0 - 52.0 % 49.0 48.5 51.0  Platelets 150 - 400 K/uL 176 195 210   CMP Latest Ref Rng & Units 03/27/2021 03/27/2020 09/16/2019  Glucose 70 - 99 mg/dL 95 109(H) 132(H)  BUN 6 - 20 mg/dL _0 Creatinine 0.61 - 1.24 mg/dL 1.06 1.09 1.20  Sodium 135 - 145 mmol/L 137 138 139  Potassium 3.5 - 5.1 mmol/L 4.3 4.0 3.8  Chloride 98 - 111 mmol/L 105 104 106  CO2 22 - 32 mmol/L _1 Calcium 8.9 - 10.3 mg/dL 8.6(L) 8.9 9.1  Total Protein 6.5 - 8.1 g/dL 6.6 6.7 6.8  Total Bilirubin 0.3 - 1.2 mg/dL 0.7 0.9 0.7  Alkaline Phos 38 - 126 U/L 104 96 102  AST 15 - 41 U/L _2 ALT 0 - 44 U/L 28 21 38      Component Value Date/Time   RBC 5.22 03/27/2021 1230   MCV 93.9 03/27/2021 1230   MCH 32.4 03/27/2021 1230   MCHC 34.5 03/27/2021 1230   RDW 12.8 03/27/2021 1230   LYMPHSABS 1.3 03/27/2021 1230   MONOABS 0.6 03/27/2021 1230   EOSABS 0.2 03/27/2021 1230   BASOSABS 0.1 03/27/2021 1230    DIAGNOSTIC IMAGING:  I have independently reviewed the scans and discussed with the patient. No results found.   ASSESSMENT:  1.  Eosinophilia: - Elevated absolute eosinophil count of 5800 recorded on 12/04/2017.  Most  recent dated absolute eosinophil count was 1400 on 01/06/2018.   -Elevated IgE level on 2 occasions of 850 and 588. -High-resolution CT of the chest did not show any lung involvement. - FGFR1 and  PDGFR beta on peripheral blood was negative.  Strongyloides antibody was negative.  LDH was in the upper limit of normal. - CT scan of the abdomen on 03/20/2018 shows spleen size in the upper limits of normal.  Echocardiogram shows ejection fraction of 55 to 60% with mild thickening of the leaflets.  He had normal tryptase levels and B12 was normal. - PDGFR A and FIP1L1 was negative.  Jak 2 mutation was also negative. - As there was no convincing evidence of primary eosinophilic disorders, I did not recommend against bone marrow aspiration and biopsy.   PLAN:  1.  Eosinophilia: - He does not report any fevers, night sweats or weight loss in the last 1 year.  No infections reported. - He had erythematous maculopapular rash on the hands about a month ago which improved with betamethasone cream. - Reviewed his labs from 03/27/2021 which showed normal white count 6.8 with normal differential.  Eosinophil count absolute was 0.2.  LDH was normal. - IgE level is pending ordered by Dr. Lamonte Sakai.  He does not have any rashes at this visit. - RTC 1 year for follow-up with repeat labs.   2.  Moderate persistent asthma: - He reportedly had a reaction to Dupixent with rash around the eyes and was discontinued around May. - He is currently taking prednisone 2.5 mg twice daily which is being tapered slowly. - He will continue Singulair, Zyrtec and Flonase.  Continue albuterol as needed. -He reportedly uses CPAP twice per week as it is very uncomfortable. - Continue follow-up with Dr. Lamonte Sakai closely.    Orders placed this encounter:  No orders of the defined types were placed in this encounter.    Derek Jack, MD [place] [phone]   I, Thana Ates, am acting as a scribe for Dr.  Derek Jack.  I, Derek Jack MD, have reviewed the above documentation for accuracy and completeness, and I agree with the above.

## 2021-03-27 ENCOUNTER — Inpatient Hospital Stay (HOSPITAL_COMMUNITY): Payer: 59

## 2021-03-27 ENCOUNTER — Inpatient Hospital Stay (HOSPITAL_COMMUNITY): Payer: 59 | Attending: Hematology | Admitting: Hematology

## 2021-03-27 ENCOUNTER — Encounter (HOSPITAL_COMMUNITY): Payer: Self-pay | Admitting: Hematology

## 2021-03-27 ENCOUNTER — Other Ambulatory Visit: Payer: Self-pay

## 2021-03-27 VITALS — BP 132/87 | HR 73 | Temp 96.9°F | Resp 18 | Wt 260.8 lb

## 2021-03-27 DIAGNOSIS — Z79899 Other long term (current) drug therapy: Secondary | ICD-10-CM | POA: Diagnosis not present

## 2021-03-27 DIAGNOSIS — Z7952 Long term (current) use of systemic steroids: Secondary | ICD-10-CM | POA: Diagnosis not present

## 2021-03-27 DIAGNOSIS — R21 Rash and other nonspecific skin eruption: Secondary | ICD-10-CM | POA: Diagnosis not present

## 2021-03-27 DIAGNOSIS — D721 Eosinophilia, unspecified: Secondary | ICD-10-CM | POA: Insufficient documentation

## 2021-03-27 DIAGNOSIS — D7219 Other eosinophilia: Secondary | ICD-10-CM | POA: Diagnosis not present

## 2021-03-27 DIAGNOSIS — L309 Dermatitis, unspecified: Secondary | ICD-10-CM

## 2021-03-27 DIAGNOSIS — Z87891 Personal history of nicotine dependence: Secondary | ICD-10-CM | POA: Insufficient documentation

## 2021-03-27 DIAGNOSIS — J4540 Moderate persistent asthma, uncomplicated: Secondary | ICD-10-CM | POA: Insufficient documentation

## 2021-03-27 LAB — CBC WITH DIFFERENTIAL/PLATELET
Abs Immature Granulocytes: 0.03 10*3/uL (ref 0.00–0.07)
Basophils Absolute: 0.1 10*3/uL (ref 0.0–0.1)
Basophils Relative: 1 %
Eosinophils Absolute: 0.2 10*3/uL (ref 0.0–0.5)
Eosinophils Relative: 3 %
HCT: 49 % (ref 39.0–52.0)
Hemoglobin: 16.9 g/dL (ref 13.0–17.0)
Immature Granulocytes: 0 %
Lymphocytes Relative: 19 %
Lymphs Abs: 1.3 10*3/uL (ref 0.7–4.0)
MCH: 32.4 pg (ref 26.0–34.0)
MCHC: 34.5 g/dL (ref 30.0–36.0)
MCV: 93.9 fL (ref 80.0–100.0)
Monocytes Absolute: 0.6 10*3/uL (ref 0.1–1.0)
Monocytes Relative: 9 %
Neutro Abs: 4.6 10*3/uL (ref 1.7–7.7)
Neutrophils Relative %: 68 %
Platelets: 176 10*3/uL (ref 150–400)
RBC: 5.22 MIL/uL (ref 4.22–5.81)
RDW: 12.8 % (ref 11.5–15.5)
WBC: 6.8 10*3/uL (ref 4.0–10.5)
nRBC: 0 % (ref 0.0–0.2)

## 2021-03-27 LAB — COMPREHENSIVE METABOLIC PANEL
ALT: 28 U/L (ref 0–44)
AST: 28 U/L (ref 15–41)
Albumin: 3.9 g/dL (ref 3.5–5.0)
Alkaline Phosphatase: 104 U/L (ref 38–126)
Anion gap: 5 (ref 5–15)
BUN: 11 mg/dL (ref 6–20)
CO2: 27 mmol/L (ref 22–32)
Calcium: 8.6 mg/dL — ABNORMAL LOW (ref 8.9–10.3)
Chloride: 105 mmol/L (ref 98–111)
Creatinine, Ser: 1.06 mg/dL (ref 0.61–1.24)
GFR, Estimated: >60 mL/min (ref >60)
Glucose, Bld: 95 mg/dL (ref 70–99)
Potassium: 4.3 mmol/L (ref 3.5–5.1)
Sodium: 137 mmol/L (ref 135–145)
Total Bilirubin: 0.7 mg/dL (ref 0.3–1.2)
Total Protein: 6.6 g/dL (ref 6.5–8.1)

## 2021-03-27 LAB — LACTATE DEHYDROGENASE: LDH: 157 U/L (ref 98–192)

## 2021-03-27 NOTE — Patient Instructions (Addendum)
[facility] at [facility] Discharge Instructions  You were seen today by Dr. Katragadda. He went over your recent results. Dr. Katragadda will see you back in 1 year for labs and follow up.   Thank you for choosing [facility] at [facility] to provide your oncology and hematology care.  To afford each patient quality time with our provider, please arrive at least 15 minutes before your scheduled appointment time.   If you have a lab appointment with the Cancer Center please come in thru the Main Entrance and check in at the main information desk  You need to re-schedule your appointment should you arrive 10 or more minutes late.  We strive to give you quality time with our providers, and arriving late affects you and other patients whose appointments are after yours.  Also, if you no show three or more times for appointments you may be dismissed from the clinic at the providers discretion.     Again, thank you for choosing [facility].  Our hope is that these requests will decrease the amount of time that you wait before being seen by our physicians.       _____________________________________________________________  Should you have questions after your visit to [facility], please contact our office at [phone] between the hours of 8:00 a.m. and 4:30 p.m.  Voicemails left after 4:00 p.m. will not be returned until the following business day.  For prescription refill requests, have your pharmacy contact our office and allow 72 hours.    Cancer Center Support Programs:   > Cancer Support Group  2nd Tuesday of the month 1pm-2pm, Journey Room    

## 2021-03-27 NOTE — Addendum Note (Signed)
Addended by: Therese Sarah on: 03/27/2021 03:46 PM   Modules accepted: Orders

## 2021-03-28 ENCOUNTER — Encounter: Payer: Self-pay | Admitting: Emergency Medicine

## 2021-03-28 ENCOUNTER — Ambulatory Visit: Payer: 59 | Admitting: Emergency Medicine

## 2021-03-28 DIAGNOSIS — J310 Chronic rhinitis: Secondary | ICD-10-CM

## 2021-03-28 DIAGNOSIS — D7219 Other eosinophilia: Secondary | ICD-10-CM | POA: Diagnosis not present

## 2021-03-28 DIAGNOSIS — J4540 Moderate persistent asthma, uncomplicated: Secondary | ICD-10-CM

## 2021-03-28 DIAGNOSIS — G4733 Obstructive sleep apnea (adult) (pediatric): Secondary | ICD-10-CM

## 2021-03-28 NOTE — Assessment & Plan Note (Signed)
Sent labs, eosinophil count, IgE are pending but clinical status has been stable since he came off Dupixent and he is tolerating tapering of prednisone.

## 2021-03-28 NOTE — Patient Instructions (Addendum)
Stopping your prednisone altogether.  Call us immediately if you develop any weakness, lethargy, increased shortness of breath or any other new symptoms as the prednisone is stopped. We will not restart scheduled inhaler medication at this time Keep albuterol available to use 2 puffs when needed for shortness of breath, chest tightness, wheezing. Continue Zyrtec once daily. Try to wear your CPAP every night Follow with Dr Delton Coombes in 6 months or sooner if you have any problems

## 2021-03-28 NOTE — Progress Notes (Signed)
Subjective:    Patient ID: Jose Ross, male    DOB: 20-Mar-1971, 50 y.o.   MRN: [id number]  Asthma He complains of cough. There is no shortness of breath or wheezing. Pertinent negatives include no ear pain, fever, headaches, postnasal drip, rhinorrhea, sneezing, sore throat or trouble swallowing. His past medical history is significant for asthma.  Shortness of Breath Pertinent negatives include no ear pain, fever, headaches, leg swelling, rash, rhinorrhea, sore throat, vomiting or wheezing. His past medical history is significant for asthma.    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  He has had a significant improvement when he was started on Dupixent - every 2 weeks.  At his last visit we continued prednisone 2.5 mg twice a day which is as low as we been able to get him.  He is managed on Trelegy, Singulair, Zyrtec, Flonase, nasal rinses. He reports that he has decreased his Trelegy use - probably only taking 3x a week.  He has albuterol which he uses about once a day. He is having a little UA noise. Has lost weight. Gets a little fatigued and SOB by the end of the work day  ROV 02/22/21 --Jose Ross is a 50 year old gentleman with hypereosinophilia and moderate persistent asthma.  His obstructive lung disease, coughing allergic symptoms have been difficult to treat but responded well to initiation of Dupixent.  He is noticing that has been developing scleral injection, a periorbital edema and erythema, pruritus around the time he takes his Dupixent.  His last Dupixent injection had been 11/30/2020. He is off trelegy for the last 3 weeks, using albuterol rarely. Feels very good from a resp standpoint. On Pred 2.5mg . he has a erythematous papular rash on dorsum of both of his hands. Itches with hot water.  He saw ophthalmology and dx with blepharitis, no intervention to make.  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  When I last saw him he had come off Dupixent due to some blepharitis.  His asthma control has remained good, even off Trelegy, but he had an associated dermatitis.  I tried treating him with topical steroids and attempted to continue to wean his prednisone, currently down to prednisone 2.5 mg every other day. Uses albuterol rarely - sometimes at work when he is exerting. About 2x a week.  He has OSA, his CPAP compliance is still spotty. Doesn't wear it every night.  He is on Zyrtec for his allergic rhinitis. No longer on singulair or nasal steroid.     Review of Systems  Constitutional:  Negative for fever and unexpected weight change.  HENT:  Positive for congestion. Negative for dental problem, ear pain, nosebleeds, postnasal drip, rhinorrhea, sinus pressure, sneezing, sore throat and trouble swallowing.   Eyes:  Negative for redness and itching.  Respiratory:  Positive for cough and chest tightness. Negative for shortness of breath and wheezing.   Cardiovascular:  Negative for palpitations and leg swelling.  Gastrointestinal:  Negative for nausea and vomiting.  Genitourinary:  Negative for dysuria.  Musculoskeletal:  Negative for joint swelling.  Skin:  Negative for rash.  Allergic/Immunologic: Positive for environmental allergies. Negative for food allergies and immunocompromised state.  Neurological:  Negative for headaches.  Hematological:  Does not bruise/bleed easily.  Psychiatric/Behavioral:  Negative for dysphoric mood. The patient is not  nervous/anxious.        Objective:   Physical Exam Vitals:   03/28/21 1524  BP: (!) 148/86  Pulse: 98  Temp: 98.7 F (37.1 C)  TempSrc: Oral  SpO2: 99%  Weight: 259 lb 6.4 oz (117.7 kg)  Height: 5\' 11"  (1.803 m)   Gen: Pleasant, well-nourished, intermittently  clearing throat, normal affect, cushingoid facies  ENT: No lesions,  mouth clear,  oropharynx clear but crowded, no stridor   Lungs: No use of accessory muscles, no wheeze  Cardiovascular: RRR, heart sounds normal, no murmur or gallops, no pretibial peripheral edema  Musculoskeletal: No deformities, no cyanosis or clubbing  Neuro: alert, non focal  Skin: Warm, no lesions or rash     Assessment & Plan:  Eosinophilia Sent labs, eosinophil count, IgE are pending but clinical status has been stable since he came off Dupixent and he is tolerating tapering of prednisone.  Asthma He has maintained good control of his asthma even in the Dupixent was stopped and now the prednisone has been tapered.  He is on 2.5 mg prednisone every other day and is ready to taper it off.  We will try this now.  I did discuss with him the signs and symptoms of adrenal insufficiency.  He will call me if he develops any problems.  He is not on any scheduled maintenance inhaled therapy and again he is tolerating.  Plan continue albuterol as needed  Chronic rhinitis Off Singulair, off nasal steroid.  He is continuing his Zyrtec.  Obstructive sleep apnea Discussed improving his CPAP compliance with him today.   , MD, PhD 03/28/2021, 3:43 PM [facility] [phone] or if no answer [phone]

## 2021-03-28 NOTE — Assessment & Plan Note (Signed)
He has maintained good control of his asthma even in the Dupixent was stopped and now the prednisone has been tapered.  He is on 2.5 mg prednisone every other day and is ready to taper it off.  We will try this now.  I did discuss with him the signs and symptoms of adrenal insufficiency.  He will call me if he develops any problems.  He is not on any scheduled maintenance inhaled therapy and again he is tolerating.  Plan continue albuterol as needed

## 2021-03-28 NOTE — Assessment & Plan Note (Signed)
Off Singulair, off nasal steroid.  He is continuing his Zyrtec.

## 2021-03-28 NOTE — Assessment & Plan Note (Signed)
Discussed improving his CPAP compliance with him today.

## 2021-04-15 ENCOUNTER — Emergency Department (HOSPITAL_COMMUNITY)
Admission: EM | Admit: 2021-04-15 | Discharge: 2021-04-15 | Disposition: A | Discharge status: Home / Self Care | Payer: 59 | Attending: Emergency Medicine | Admitting: Emergency Medicine

## 2021-04-15 ENCOUNTER — Emergency Department (HOSPITAL_BASED_OUTPATIENT_CLINIC_OR_DEPARTMENT_OTHER): Payer: 59

## 2021-04-15 ENCOUNTER — Encounter (HOSPITAL_COMMUNITY): Payer: Self-pay | Admitting: Emergency Medicine

## 2021-04-15 ENCOUNTER — Other Ambulatory Visit: Payer: Self-pay

## 2021-04-15 DIAGNOSIS — Z7951 Long term (current) use of inhaled steroids: Secondary | ICD-10-CM | POA: Insufficient documentation

## 2021-04-15 DIAGNOSIS — L03115 Cellulitis of right lower limb: Secondary | ICD-10-CM | POA: Diagnosis not present

## 2021-04-15 DIAGNOSIS — M79604 Pain in right leg: Secondary | ICD-10-CM

## 2021-04-15 DIAGNOSIS — J45909 Unspecified asthma, uncomplicated: Secondary | ICD-10-CM | POA: Diagnosis not present

## 2021-04-15 DIAGNOSIS — Z87891 Personal history of nicotine dependence: Secondary | ICD-10-CM | POA: Insufficient documentation

## 2021-04-15 DIAGNOSIS — R2241 Localized swelling, mass and lump, right lower limb: Secondary | ICD-10-CM | POA: Diagnosis not present

## 2021-04-15 MED ORDER — CEPHALEXIN 500 MG PO CAPS: 500.0000 mg | ORAL_CAPSULE | Freq: Four times a day (QID) | ORAL | 0 refills | Status: DC

## 2021-04-15 NOTE — Progress Notes (Signed)
VASCULAR LAB    Right lower extremity venous duplex has been performed.  See CV proc for preliminary results.   Anjel Perfetti, RVT 04/15/2021, 1:34 PM

## 2021-04-15 NOTE — ED Triage Notes (Signed)
Reports R lower leg pain and swelling x 1 week.  Pt just returned from car trip to [place].  Denies chest pain or SOB.

## 2021-04-15 NOTE — ED Provider Notes (Addendum)
Emergency Medicine Provider Triage Evaluation Note  Jose Ross , a 50 y.o. male  was evaluated in triage.  Pt complains of right lower leg pain, swelling, and erythema last week.  Recently took a car trip to see his kids down in [place].  He denies any shortness of breath or chest pain.  Does not recall any injury to the area  Review of Systems  Positive:  Negative: See above  Physical Exam  There were no vitals taken for this visit. Gen:   Awake, no distress   Resp:  Normal effort  MSK:   Moves extremities without difficulty  Other:  Right leg is erythematous and edematous. Dorsalis pedis pulse with doppler identified and regular.  Medical Decision Making  Medically screening exam initiated at 12:27 PM.  Appropriate orders placed.  Eddie Payette was informed that the remainder of the evaluation will be completed by another provider, this initial triage assessment does not replace that evaluation, and the importance of remaining in the ED until their evaluation is complete.     Teressa Lower, PA-C 04/15/21 1228    Honor Loh M, PA-C 04/15/21 1232    Derwood Kaplan, MD 04/16/21 [phone]

## 2021-04-15 NOTE — ED Notes (Signed)
Discharged by PA at triage. 

## 2021-04-15 NOTE — Discharge Instructions (Addendum)
You were seen and evaluated in the emergency department for further evaluation of right lower leg swelling.  As we discussed, your ultrasound was negative for DVT.  Although was negative, I would recommend following up with your primary care provider to have a repeat ultrasound within the next week or so.  Please return to the emergency department if you experience worsening pain, swelling, drainage from the leg, shortness of breath, chest pain, or any other concerns you may have.  I have given you Keflex for possible cellulitis over the right anterior shin.  Please take as prescribed.

## 2021-04-15 NOTE — ED Provider Notes (Signed)
[facility] EMERGENCY DEPARTMENT Provider Note   CSN: [id number] Arrival date & time: 04/15/21  1201     History Chief Complaint  Patient presents with   Leg Pain    Jose Ross is a 50 y.o. male who presents the emergency department for right lower leg pain, swelling, erythema that has been progressively worsening over the last week.  He states he recently took a car trip to see his [place] on Friday and developed worsening erythema and pain.  The pain Is localized primarily to the right anterior shin that is worse with ambulation.  He rates his pain moderate in severity.  He denies any injury or trauma to the area.  He denies any fever or chills, shortness of breath, or chest pain.   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 No pertinent surgical history.     Family History  Problem Relation Age of Onset   Asthma Mother    Skin cancer Mother     Social History   Tobacco Use   Smoking status: Former    Packs/day: 0.25    Years: 10.00    Pack years: 2.50    Types: Cigarettes    Start date: 36    Quit date: 2000    Years since quitting: 22.7   Smokeless tobacco: Never  Vaping Use   Vaping Use: Never used  Substance Use Topics   Alcohol use: Yes    Comment: 4-5 drinks a week   Drug use: Not Currently    Home Medications Prior to Admission medications   Medication Sig Start Date  End Date Taking? Authorizing Provider  cephALEXin (KEFLEX) 500 MG capsule Take 1 capsule (500 mg total) by mouth 4 (four) times daily. 04/15/21  Yes Meredeth Ide, Raiford Fetterman M, PA-C  albuterol (PROVENTIL) (2.5 MG/3ML) 0.083% nebulizer solution Take 3 mLs (2.5 mg total) by nebulization every 4 (four) hours as needed for wheezing or shortness of breath. 01/06/18   Byrum, Les Pou, MD  albuterol (VENTOLIN HFA) 108 (90 Base) MCG/ACT inhaler Inhale 2 puffs into the lungs every 6 (six) hours as needed for wheezing or shortness of breath. 12/02/19   Leslye Peer, MD  augmented betamethasone dipropionate (DIPROLENE AF) 0.05 % cream Apply topically 2 (two) times daily. 02/22/21   Leslye Peer, MD  EPINEPHrine 0.3 mg/0.3 mL IJ SOAJ injection Inject one dose intramuscularly for allergic reaction. May repeat one dose if needed after 5-15 minutes. Proceed to the ER 02/03/18   [provider]  omeprazole (PRILOSEC) 20 MG capsule Take 1 capsule (20 mg total) by mouth 2 (two) times daily before a meal. Patient taking differently: Take 20 mg by mouth daily. 06/08/18   Pyrtle, Carie Caddy, MD  predniSONE (DELTASONE) 5 MG tablet 2.5 mg in the am and 2.5 mg in the pm 12/29/20   Byrum, Les Pou, MD    Allergies  Patient has no known allergies.  Review of Systems   Review of Systems  All other systems reviewed and are negative.  Physical Exam Updated Vital Signs BP (!) 150/114 (BP Location: Left Arm)   Pulse 74   Temp 98.2 F (36.8 C) (Oral)   Resp 16   SpO2 99%   Physical Exam Vitals reviewed.  Constitutional:      General: He is not in acute distress.    Appearance: Normal appearance.  HENT:     Head: Normocephalic and atraumatic.  Eyes:     General:        Right eye: No discharge.        Left eye: No discharge.     Conjunctiva/sclera: Conjunctivae normal.  Cardiovascular:     Comments: Regular rate and rhythm.  S1/S2 are distinct without any evidence of murmur, rubs, or gallops.  Radial pulses are  2+ bilaterally.  Dorsalis pedis pulses are 2+ bilaterally.  No evidence of pedal edema. Pulmonary:     Effort: Pulmonary effort is normal.     Comments: Clear to auscultation bilaterally.  Normal effort.  No respiratory distress.  No evidence of wheezes, rales, or rhonchi heard throughout. Abdominal:     General: Abdomen is flat. Bowel sounds are normal. There is no distension.     Tenderness: There is no abdominal tenderness. There is no guarding or rebound.  Musculoskeletal:        General: Normal range of motion.     Cervical back: Neck supple.  Skin:    General: Skin is warm and dry.     Comments: Right anterior shin is tender to palpation with surrounding erythema.  There is mild swelling to the right lower extremity in general.  No calf tenderness.  Negative Homans' sign  Neurological:     General: No focal deficit present.     Mental Status: He is alert.  Psychiatric:        Mood and Affect: Mood normal.        Behavior: Behavior normal.    ED Results / Procedures / Treatments   Labs (all labs ordered are listed, but only abnormal results are displayed) Labs Reviewed - No data to display  EKG None  Radiology VAS [place] LOWER EXTREMITY VENOUS (DVT) (ONLY [facility] & [facility])  Result Date: 04/15/2021  Lower Venous DVT Study Patient Name:  Jose Ross  Date of Exam:   04/15/2021 Medical Rec #: [id number]         Accession #:    [id number] Date of Birth: 1970/07/26          Patient Gender: M Patient Age:   79 years Exam Location:  [facility] Procedure:      VAS [place] LOWER EXTREMITY VENOUS (DVT) Referring Phys: Honor Loh --------------------------------------------------------------------------------  Indications: Pain, Swelling, and Erythema.  Limitations: Edema of calf. Comparison Study: No prior study Performing Technologist: Sherren Kerns RVS  Examination Guidelines: A complete evaluation includes B-mode imaging, spectral Doppler, color Doppler, and power Doppler as needed of all  accessible portions of each vessel. Bilateral testing is considered an integral part of a complete examination. Limited examinations for reoccurring indications may be performed as noted. The reflux portion of the exam is performed with the patient in reverse Trendelenburg.  +---------+---------------+---------+-----------+----------+---------------+ RIGHT    CompressibilityPhasicitySpontaneityPropertiesThrombus Aging  +---------+---------------+---------+-----------+----------+---------------+ CFV      Full           Yes      Yes                                  +---------+---------------+---------+-----------+----------+---------------+  SFJ      Full                                                         +---------+---------------+---------+-----------+----------+---------------+ FV Prox  Full                                                         +---------+---------------+---------+-----------+----------+---------------+ FV Mid   Full                                                         +---------+---------------+---------+-----------+----------+---------------+ FV DistalFull                                                         +---------+---------------+---------+-----------+----------+---------------+ PFV      Full                                                         +---------+---------------+---------+-----------+----------+---------------+ POP      Full           Yes      Yes                                  +---------+---------------+---------+-----------+----------+---------------+ PTV      Full                                                         +---------+---------------+---------+-----------+----------+---------------+ PERO                                                  patent by color +---------+---------------+---------+-----------+----------+---------------+ GSV      Full                                                          +---------+---------------+---------+-----------+----------+---------------+   +----+---------------+---------+-----------+----------+--------------+ LEFTCompressibilityPhasicitySpontaneityPropertiesThrombus Aging +----+---------------+---------+-----------+----------+--------------+ CFV Full           Yes      Yes                                 +----+---------------+---------+-----------+----------+--------------+  Summary: RIGHT: - No evidence of deep vein thrombosis in the lower extremity. No indirect evidence of obstruction proximal to the inguinal ligament. - Ultrasound characteristics of enlarged lymph nodes are noted in the groin.   *See table(s) above for measurements and observations. Electronically signed by Coral Else MD on 04/15/2021 at 4:06:34 PM.    Final     Procedures Procedures   Medications Ordered in ED Medications - No data to display  ED Course  I have reviewed the triage vital signs and the nursing notes.  Pertinent labs & imaging results that were available during my care of the patient were reviewed by me and considered in my medical decision making (see chart for details).  Clinical Course as of 04/15/21 Fabio Neighbors Apr 15, 2021  1500 I discussed this case with my attending physician who cosigned this note including patient's presenting symptoms, physical exam, and planned diagnostics and interventions. Attending physician stated agreement with plan or made changes to plan which were implemented.    [CF]    Clinical Course User Index [CF] Jolyn Lent   MDM Rules/Calculators/A&P                          Zyire Eidson is a 50 y.o. male who presents to the emergency department for further evaluation of right leg swelling, local erythema, and warmth.  History and physical exam was concerning for possible developing cellulitis versus deep venous thrombosis.  Although the swelling and pain started prior to his long car  trip I felt it would be necessary to get a ultrasound for DVT rule out.  This was negative.  I will empirically treat him for possible developing cellulitis of the right anterior shin with Keflex.  I have given him follow-up with [facility] and instructed him to get a repeat ultrasound to further rule out DVT.  Patient understood and is agreeable to this plan.  Strict return precautions were given.  He is hemodynamically stable and safe for discharge.   Final Clinical Impression(s) / ED Diagnoses Final diagnoses:  Cellulitis of right lower extremity    Rx / DC Orders ED Discharge Orders          Ordered    cephALEXin (KEFLEX) 500 MG capsule  4 times daily        04/15/21 1502             Honor Loh [place], [place] 04/15/21 1920    Derwood Kaplan, MD 04/16/21 [phone]

## 2021-06-25 ENCOUNTER — Other Ambulatory Visit: Payer: Self-pay | Admitting: Emergency Medicine

## 2022-02-01 ENCOUNTER — Other Ambulatory Visit: Payer: Self-pay | Admitting: Emergency Medicine

## 2022-04-02 ENCOUNTER — Ambulatory Visit (HOSPITAL_COMMUNITY): Payer: 59 | Admitting: Hematology

## 2022-04-02 ENCOUNTER — Other Ambulatory Visit (HOSPITAL_COMMUNITY): Payer: 59

## 2022-04-10 ENCOUNTER — Inpatient Hospital Stay: Payer: 59 | Admitting: Hematology

## 2022-04-10 ENCOUNTER — Inpatient Hospital Stay: Payer: 59 | Attending: Hematology

## 2022-04-10 VITALS — BP 139/82 | HR 69 | Temp 98.4°F | Resp 18 | Ht 70.0 in | Wt 256.3 lb

## 2022-04-10 DIAGNOSIS — D721 Eosinophilia, unspecified: Secondary | ICD-10-CM | POA: Insufficient documentation

## 2022-04-10 DIAGNOSIS — D7219 Other eosinophilia: Secondary | ICD-10-CM | POA: Diagnosis not present

## 2022-04-10 DIAGNOSIS — Z79899 Other long term (current) drug therapy: Secondary | ICD-10-CM | POA: Diagnosis not present

## 2022-04-10 LAB — CBC WITH DIFFERENTIAL/PLATELET
Abs Immature Granulocytes: 0.02 10*3/uL (ref 0.00–0.07)
Basophils Absolute: 0.1 10*3/uL (ref 0.0–0.1)
Basophils Relative: 1 %
Eosinophils Absolute: 0.2 10*3/uL (ref 0.0–0.5)
Eosinophils Relative: 4 %
HCT: 45.8 % (ref 39.0–52.0)
Hemoglobin: 15.9 g/dL (ref 13.0–17.0)
Immature Granulocytes: 0 %
Lymphocytes Relative: 25 %
Lymphs Abs: 1.6 10*3/uL (ref 0.7–4.0)
MCH: 32.7 pg (ref 26.0–34.0)
MCHC: 34.7 g/dL (ref 30.0–36.0)
MCV: 94.2 fL (ref 80.0–100.0)
Monocytes Absolute: 0.5 10*3/uL (ref 0.1–1.0)
Monocytes Relative: 9 %
Neutro Abs: 3.8 10*3/uL (ref 1.7–7.7)
Neutrophils Relative %: 61 %
Platelets: 165 10*3/uL (ref 150–400)
RBC: 4.86 MIL/uL (ref 4.22–5.81)
RDW: 12.8 % (ref 11.5–15.5)
WBC: 6.2 10*3/uL (ref 4.0–10.5)
nRBC: 0 % (ref 0.0–0.2)

## 2022-04-10 LAB — LACTATE DEHYDROGENASE: LDH: 147 U/L (ref 98–192)

## 2022-04-10 LAB — COMPREHENSIVE METABOLIC PANEL
ALT: 26 U/L (ref 0–44)
AST: 23 U/L (ref 15–41)
Albumin: 4.1 g/dL (ref 3.5–5.0)
Alkaline Phosphatase: 99 U/L (ref 38–126)
Anion gap: 9 (ref 5–15)
BUN: 22 mg/dL — ABNORMAL HIGH (ref 6–20)
CO2: 25 mmol/L (ref 22–32)
Calcium: 9.1 mg/dL (ref 8.9–10.3)
Chloride: 106 mmol/L (ref 98–111)
Creatinine, Ser: 1.21 mg/dL (ref 0.61–1.24)
GFR, Estimated: >60 mL/min (ref >60)
Glucose, Bld: 84 mg/dL (ref 70–99)
Potassium: 4 mmol/L (ref 3.5–5.1)
Sodium: 140 mmol/L (ref 135–145)
Total Bilirubin: 1.6 mg/dL — ABNORMAL HIGH (ref 0.3–1.2)
Total Protein: 6.8 g/dL (ref 6.5–8.1)

## 2022-04-10 NOTE — Progress Notes (Signed)
Next  [address] [place], [place] [address]   CLINIC:  Medical Oncology/Hematology  PCP:  Patient, No Pcp Per None  None  REASON FOR VISIT:  Follow-up for eosinophilia  PRIOR THERAPY: none  CURRENT THERAPY: surveillance  INTERVAL HISTORY:  Mr. Jose Ross, a 51 y.o. male, returns for follow-up of eosinophilia.  Denies any shortness of breath.  He has been off of all inhalers.  He is only using Ventolin as needed.  He is also off of prednisone.  No recent infections.  REVIEW OF SYSTEMS:  Review of Systems  All other systems reviewed and are negative.   PAST MEDICAL/SURGICAL HISTORY:  Past Medical History:  Diagnosis Date   Asthma    Eosinophilia    Pulmonary nodules    No past surgical history on file.  SOCIAL HISTORY:  Social History   Socioeconomic History   Marital status: Single    Spouse name: Not on file   Number of children: 2   Years of education: Not on file   Highest education level: Not on file  Occupational History   Not on file  Tobacco Use   Smoking status: Former    Packs/day: 0.25    Years: 10.00    Total pack years: 2.50    Types: Cigarettes    Start date: 50    Quit date: 2000    Years since quitting: 23.7   Smokeless tobacco: Never  Vaping Use   Vaping Use: Never used  Substance and Sexual Activity   Alcohol use: Yes    Comment: 4-5 drinks a week   Drug use: Not Currently   Sexual activity: Not on file  Other Topics Concern   Not on file  Social History Narrative   Not on file   Social Determinants of Health   Financial Resource Strain: Low Risk  (03/05/2018)   Overall Financial Resource Strain (CARDIA)    Difficulty of Paying Living Expenses: Not hard at all  Food Insecurity: No Food Insecurity (03/05/2018)   Hunger Vital Sign    Worried About Running Out of Food in the Last Year: Never true    [place] in the Last Year: Never true  Transportation Needs: No Transportation Needs  (03/05/2018)   PRAPARE - Hydrologist (Medical): No    Lack of Transportation (Non-Medical): No  Physical Activity: Insufficiently Active (03/05/2018)   Exercise Vital Sign    Days of Exercise per Week: 3 days    Minutes of Exercise per Session: 30 min  Stress: No Stress Concern Present (03/05/2018)   [place]    Feeling of Stress : Only a little  Social Connections: Moderately Isolated (03/05/2018)   Social Connection and Isolation Panel [NHANES]    Frequency of Communication with Friends and Family: More than three times a week    Frequency of Social Gatherings with Friends and Family: Three times a week    Attends Religious Services: Never    Active Member of Clubs or Organizations: No    Attends Archivist Meetings: Never    Marital Status: Divorced  Human resources officer Violence: Not At Risk (03/05/2018)   Humiliation, Afraid, Rape, and Kick questionnaire    Fear of Current or Ex-Partner: No    Emotionally Abused: No    Physically Abused: No    Sexually Abused: No    FAMILY HISTORY:  Family History  Problem  Relation Age of Onset   Asthma Mother    Skin cancer Mother     CURRENT MEDICATIONS:  Current Outpatient Medications  Medication Sig Dispense Refill   albuterol (VENTOLIN HFA) 108 (90 Base) MCG/ACT inhaler INHALE TWO PUFFS INTO LUNGS EVERY 6 HOURS AS NEEDED FOR WHEEZING OR SHORTNESS OF BREATH 8.5 g 5   EPINEPHrine 0.3 mg/0.3 mL IJ SOAJ injection Inject one dose intramuscularly for allergic reaction. May repeat one dose if needed after 5-15 minutes. Proceed to the ER  11   omeprazole (PRILOSEC) 20 MG capsule Take 1 capsule (20 mg total) by mouth 2 (two) times daily before a meal. (Patient taking differently: Take 20 mg by mouth daily.) 60 capsule 1   No current facility-administered medications for this visit.    ALLERGIES:  No Known Allergies  PHYSICAL EXAM:   Performance status (ECOG): 0 - Asymptomatic  Vitals:   04/10/22 1456  BP: 139/82  Pulse: 69  Resp: 18  Temp: 98.4 F (36.9 C)  SpO2: 99%   Wt Readings from Last 3 Encounters:  04/10/22 256 lb 4.8 oz (116.3 kg)  03/28/21 259 lb 6.4 oz (117.7 kg)  03/27/21 260 lb 12.8 oz (118.3 kg)   Physical Exam Vitals reviewed.  Constitutional:      Appearance: Normal appearance.  Cardiovascular:     Rate and Rhythm: Normal rate and regular rhythm.     Pulses: Normal pulses.     Heart sounds: Normal heart sounds.  Pulmonary:     Effort: Pulmonary effort is normal.     Breath sounds: Normal breath sounds.  Abdominal:     Palpations: Abdomen is soft. There is no hepatomegaly, splenomegaly or mass.     Tenderness: There is no abdominal tenderness.  Musculoskeletal:     Right lower leg: No edema.     Left lower leg: No edema.  Neurological:     General: No focal deficit present.     Mental Status: He is alert and oriented to person, place, and time.  Psychiatric:        Mood and Affect: Mood normal.        Behavior: Behavior normal.     LABORATORY DATA:  I have reviewed the labs as listed.     Latest Ref Rng & Units 04/10/2022    1:54 PM 03/27/2021   12:30 PM 03/27/2020    2:05 PM  CBC  WBC 4.0 - 10.5 K/uL 6.2  6.8  7.5   Hemoglobin 13.0 - 17.0 g/dL 15.9  16.9  16.3   Hematocrit 39.0 - 52.0 % 45.8  49.0  48.5   Platelets 150 - 400 K/uL 165  176  195       Latest Ref Rng & Units 04/10/2022    1:54 PM 03/27/2021   12:30 PM 03/27/2020    2:05 PM  CMP  Glucose 70 - 99 mg/dL 84  95  109   BUN 6 - 20 mg/dL $Remove'22  11  17   'GbiFHmC$ Creatinine 0.61 - 1.24 mg/dL 1.21  1.06  1.09   Sodium 135 - 145 mmol/L 140  137  138   Potassium 3.5 - 5.1 mmol/L 4.0  4.3  4.0   Chloride 98 - 111 mmol/L 106  105  104   CO2 22 - 32 mmol/L $RemoveB'25  27  24   'KRzpOanx$ Calcium 8.9 - 10.3 mg/dL 9.1  8.6  8.9   Total Protein 6.5 - 8.1 g/dL 6.8  6.6  6.7  Total Bilirubin 0.3 - 1.2 mg/dL 1.6  0.7  0.9   Alkaline Phos 38 - 126  U/L 99  104  96   AST 15 - 41 U/L $Remo'23  28  19   'EmySg$ ALT 0 - 44 U/L $Remo'26  28  21       'dhOlf$ Component Value Date/Time   RBC 4.86 04/10/2022 1354   MCV 94.2 04/10/2022 1354   MCH 32.7 04/10/2022 1354   MCHC 34.7 04/10/2022 1354   RDW 12.8 04/10/2022 1354   LYMPHSABS 1.6 04/10/2022 1354   MONOABS 0.5 04/10/2022 1354   EOSABS 0.2 04/10/2022 1354   BASOSABS 0.1 04/10/2022 1354    DIAGNOSTIC IMAGING:  I have independently reviewed the scans and discussed with the patient. No results found.   ASSESSMENT:  1.  Eosinophilia: - Elevated absolute eosinophil count of 5800 recorded on 12/04/2017.  Most recent dated absolute eosinophil count was 1400 on 01/06/2018.   -Elevated IgE level on 2 occasions of 850 and 588. -High-resolution CT of the chest did not show any lung involvement. - FGFR1 and PDGFR beta on peripheral blood was negative.  Strongyloides antibody was negative.  LDH was in the upper limit of normal. - CT scan of the abdomen on 03/20/2018 shows spleen size in the upper limits of normal.  Echocardiogram shows ejection fraction of 55 to 60% with mild thickening of the leaflets.  He had normal tryptase levels and B12 was normal. - PDGFR A and FIP1L1 was negative.  Jak 2 mutation was also negative. - As there was no convincing evidence of primary eosinophilic disorders, I did not recommend against bone marrow aspiration and biopsy.   PLAN:  1.  Eosinophilia: - Does not have any B symptoms or infections. - He has a erythematous rash on the hands which is stable. - Reviewed labs today which showed normal LFTs, CBC.  Differential was normal.  No eosinophilia was seen.  IgE level is pending.  Last IgE level was normal x2. - His eosinophilia has resolved for the past 3 years.  He is does not have any symptoms.  I will discharge him from our clinic.  We will be glad to see him back on an as-needed basis. - We will make a referral to primary care as he does not have a PMD.   2.  Moderate persistent  asthma: - Continue Ventolin as needed. - He is off of prednisone, Singulair.  Continue follow-up with Dr. Lamonte Sakai.    Orders placed this encounter:  No orders of the defined types were placed in this encounter.    Derek Jack, MD [place] [phone]

## 2022-04-10 NOTE — Patient Instructions (Addendum)
[facility]  Discharge Instructions  You were seen and examined today by Dr. Delton Coombes.  Your labs are normal - and have been.  Follow-up as needed only - we will refer you to [facility] for ongoing primary care.  Thank you for choosing [facility] to provide your oncology and hematology care.   To afford each patient quality time with our provider, please arrive at least 15 minutes before your scheduled appointment time. You may need to reschedule your appointment if you arrive late (10 or more minutes). Arriving late affects you and other patients whose appointments are after yours.  Also, if you miss three or more appointments without notifying the office, you may be dismissed from the clinic at the provider's discretion.    Again, thank you for choosing [facility].  Our hope is that these requests will decrease the amount of time that you wait before being seen by our physicians.   If you have a lab appointment with the [facility] please come in thru the Main Entrance and check in at the main information desk.           _____________________________________________________________  Should you have questions after your visit to [facility], please contact our office at [phone] and follow the prompts.  Our office hours are 8:00 a.m. to 4:30 p.m. Monday - Thursday and 8:00 a.m. to 2:30 p.m. Friday.  Please note that voicemails left after 4:00 p.m. may not be returned until the following business day.  We are closed weekends and all major holidays.  You do have access to a nurse 24-7, just call the main number to the clinic [phone] and do not press any options, hold on the line and a nurse will answer the phone.    For prescription refill requests, have your pharmacy contact our office and allow 72 hours.    Masks are optional in the cancer centers. If you would like for your care  team to wear a mask while they are taking care of you, please let them know. You may have one support person who is at least 51 years old accompany you for your appointments.

## 2022-04-12 ENCOUNTER — Encounter: Payer: Self-pay | Admitting: Internal Medicine

## 2022-04-13 LAB — IGE: IgE (Immunoglobulin E), Serum: 37 IU/mL (ref 6–495)

## 2024-06-08 ENCOUNTER — Other Ambulatory Visit (HOSPITAL_COMMUNITY): Payer: Self-pay | Admitting: Family Medicine

## 2024-06-08 ENCOUNTER — Ambulatory Visit (HOSPITAL_COMMUNITY)
Admission: RE | Admit: 2024-06-08 | Discharge: 2024-06-08 | Disposition: A | Discharge status: Home / Self Care | Source: Ambulatory Visit | Attending: Surgery | Admitting: Surgery

## 2024-06-08 DIAGNOSIS — M79662 Pain in left lower leg: Secondary | ICD-10-CM
